# Patient Record
Sex: Male | Born: 1935 | Race: Black or African American | Hispanic: No | Marital: Married | Smoking: Former smoker
Health system: Southern US, Community
[De-identification: ages and names within clinical notes are randomized; demographics above are authoritative.]

## PROBLEM LIST (undated history)

## (undated) DIAGNOSIS — D46Z Other myelodysplastic syndromes: Secondary | ICD-10-CM

## (undated) DIAGNOSIS — D696 Thrombocytopenia, unspecified: Secondary | ICD-10-CM

## (undated) DIAGNOSIS — K922 Gastrointestinal hemorrhage, unspecified: Secondary | ICD-10-CM

## (undated) DIAGNOSIS — D638 Anemia in other chronic diseases classified elsewhere: Secondary | ICD-10-CM

## (undated) DIAGNOSIS — I10 Essential (primary) hypertension: Secondary | ICD-10-CM

## (undated) DIAGNOSIS — C801 Malignant (primary) neoplasm, unspecified: Secondary | ICD-10-CM

## (undated) DIAGNOSIS — I350 Nonrheumatic aortic (valve) stenosis: Secondary | ICD-10-CM

## (undated) DIAGNOSIS — M549 Dorsalgia, unspecified: Secondary | ICD-10-CM

## (undated) DIAGNOSIS — I2510 Atherosclerotic heart disease of native coronary artery without angina pectoris: Secondary | ICD-10-CM

## (undated) DIAGNOSIS — N289 Disorder of kidney and ureter, unspecified: Secondary | ICD-10-CM

## (undated) DIAGNOSIS — M1990 Unspecified osteoarthritis, unspecified site: Secondary | ICD-10-CM

## (undated) DIAGNOSIS — D4620 Refractory anemia with excess of blasts, unspecified: Secondary | ICD-10-CM

## (undated) DIAGNOSIS — E785 Hyperlipidemia, unspecified: Secondary | ICD-10-CM

## (undated) HISTORY — DX: Other myelodysplastic syndromes: D46.Z

## (undated) HISTORY — DX: Unspecified osteoarthritis, unspecified site: M19.90

## (undated) HISTORY — DX: Essential (primary) hypertension: I10

## (undated) HISTORY — DX: Nonrheumatic aortic (valve) stenosis: I35.0

## (undated) HISTORY — DX: Atherosclerotic heart disease of native coronary artery without angina pectoris: I25.10

## (undated) HISTORY — DX: Thrombocytopenia, unspecified: D69.6

## (undated) HISTORY — DX: Anemia in other chronic diseases classified elsewhere: D63.8

## (undated) HISTORY — DX: Gastrointestinal hemorrhage, unspecified: K92.2

## (undated) HISTORY — DX: Dorsalgia, unspecified: M54.9

## (undated) HISTORY — DX: Refractory anemia with excess of blasts, unspecified: D46.20

## (undated) HISTORY — DX: Hyperlipidemia, unspecified: E78.5

## (undated) HISTORY — DX: Disorder of kidney and ureter, unspecified: N28.9

---

## 2000-09-15 ENCOUNTER — Ambulatory Visit (HOSPITAL_COMMUNITY): Admission: RE | Admit: 2000-09-15 | Discharge: 2000-09-15 | Payer: Self-pay | Admitting: Internal Medicine

## 2003-02-12 ENCOUNTER — Encounter: Payer: Self-pay | Admitting: Emergency Medicine

## 2003-02-12 ENCOUNTER — Emergency Department (HOSPITAL_COMMUNITY): Admission: EM | Admit: 2003-02-12 | Discharge: 2003-02-13 | Payer: Self-pay | Admitting: Emergency Medicine

## 2005-09-18 ENCOUNTER — Ambulatory Visit: Payer: Self-pay | Admitting: Hematology & Oncology

## 2005-10-02 LAB — MORPHOLOGY

## 2005-10-02 LAB — COMPREHENSIVE METABOLIC PANEL
AST: 26 U/L (ref 0–37)
Albumin: 4.6 g/dL (ref 3.5–5.2)
Alkaline Phosphatase: 51 U/L (ref 39–117)
BUN: 28 mg/dL — ABNORMAL HIGH (ref 6–23)
Glucose, Bld: 155 mg/dL — ABNORMAL HIGH (ref 70–99)
Potassium: 4.2 mEq/L (ref 3.5–5.3)
Total Bilirubin: 0.6 mg/dL (ref 0.3–1.2)

## 2005-10-02 LAB — CBC WITH DIFFERENTIAL/PLATELET
BASO%: 1.5 % (ref 0.0–2.0)
EOS%: 19.8 % — ABNORMAL HIGH (ref 0.0–7.0)
MCH: 28.3 pg (ref 28.0–33.4)
MCHC: 33.2 g/dL (ref 32.0–35.9)
MONO#: 0.2 10*3/uL (ref 0.1–0.9)
RBC: 4.48 10*6/uL (ref 4.20–5.71)
RDW: 17.8 % — ABNORMAL HIGH (ref 11.2–14.6)
WBC: 6.1 10*3/uL (ref 4.0–10.0)
lymph#: 1.2 10*3/uL (ref 0.9–3.3)

## 2005-10-02 LAB — CHCC SMEAR

## 2005-10-03 LAB — PROTEIN ELECTROPHORESIS, SERUM
Albumin ELP: 56.6 % (ref 55.8–66.1)
Alpha-1-Globulin: 4.5 % (ref 2.9–4.9)
Alpha-2-Globulin: 11.8 % (ref 7.1–11.8)
Beta 2: 5.2 % (ref 3.2–6.5)
Beta Globulin: 5.9 % (ref 4.7–7.2)
Total Protein, Serum Electrophoresis: 7.5 g/dL (ref 6.0–8.3)

## 2005-10-31 ENCOUNTER — Ambulatory Visit: Payer: Self-pay | Admitting: Internal Medicine

## 2005-11-13 ENCOUNTER — Encounter (INDEPENDENT_AMBULATORY_CARE_PROVIDER_SITE_OTHER): Payer: Self-pay | Admitting: Specialist

## 2005-11-13 ENCOUNTER — Ambulatory Visit: Payer: Self-pay | Admitting: Internal Medicine

## 2005-11-21 ENCOUNTER — Ambulatory Visit: Payer: Self-pay | Admitting: Hematology & Oncology

## 2005-11-27 LAB — CBC WITH DIFFERENTIAL/PLATELET
BASO%: 0.3 % (ref 0.0–2.0)
Basophils Absolute: 0 10e3/uL (ref 0.0–0.1)
EOS%: 16 % — ABNORMAL HIGH (ref 0.0–7.0)
Eosinophils Absolute: 1 10e3/uL — ABNORMAL HIGH (ref 0.0–0.5)
HCT: 36.8 % — ABNORMAL LOW (ref 38.7–49.9)
HGB: 12.1 g/dL — ABNORMAL LOW (ref 13.0–17.1)
LYMPH%: 17.1 % (ref 14.0–48.0)
MCH: 28.5 pg (ref 28.0–33.4)
MCHC: 33 g/dL (ref 32.0–35.9)
MCV: 86.2 fL (ref 81.6–98.0)
MONO#: 0.3 10e3/uL (ref 0.1–0.9)
MONO%: 4.4 % (ref 0.0–13.0)
NEUT#: 3.9 10e3/uL (ref 1.5–6.5)
NEUT%: 62.2 % (ref 40.0–75.0)
Platelets: 154 10e3/uL (ref 145–400)
RBC: 4.27 10e6/uL (ref 4.20–5.71)
RDW: 18.3 % — ABNORMAL HIGH (ref 11.2–14.6)
WBC: 6.3 10e3/uL (ref 4.0–10.0)
lymph#: 1.1 10e3/uL (ref 0.9–3.3)

## 2005-11-27 LAB — MORPHOLOGY: PLT EST: ADEQUATE

## 2006-03-24 ENCOUNTER — Ambulatory Visit: Payer: Self-pay | Admitting: Hematology & Oncology

## 2006-03-26 LAB — CBC WITH DIFFERENTIAL/PLATELET
Basophils Absolute: 0 10*3/uL (ref 0.0–0.1)
Eosinophils Absolute: 0 10*3/uL (ref 0.0–0.5)
HGB: 11.9 g/dL — ABNORMAL LOW (ref 13.0–17.1)
MCV: 85.9 fL (ref 81.6–98.0)
MONO%: 4.3 % (ref 0.0–13.0)
NEUT#: 5 10*3/uL (ref 1.5–6.5)
RBC: 4.12 10*6/uL — ABNORMAL LOW (ref 4.20–5.71)
RDW: 18.7 % — ABNORMAL HIGH (ref 11.2–14.6)
WBC: 6.4 10*3/uL (ref 4.0–10.0)
lymph#: 1.1 10*3/uL (ref 0.9–3.3)

## 2006-03-26 LAB — CHCC SMEAR

## 2007-06-28 ENCOUNTER — Inpatient Hospital Stay (HOSPITAL_COMMUNITY): Admission: AD | Admit: 2007-06-28 | Discharge: 2007-07-12 | Payer: Self-pay | Admitting: Cardiology

## 2007-06-28 ENCOUNTER — Encounter: Payer: Self-pay | Admitting: Emergency Medicine

## 2007-06-29 ENCOUNTER — Encounter: Payer: Self-pay | Admitting: Cardiothoracic Surgery

## 2007-06-29 HISTORY — PX: CARDIAC CATHETERIZATION: SHX172

## 2007-06-30 ENCOUNTER — Encounter: Payer: Self-pay | Admitting: Cardiothoracic Surgery

## 2007-06-30 ENCOUNTER — Encounter: Payer: Self-pay | Admitting: Cardiology

## 2007-07-01 ENCOUNTER — Ambulatory Visit: Payer: Self-pay | Admitting: Cardiothoracic Surgery

## 2007-07-06 ENCOUNTER — Encounter: Payer: Self-pay | Admitting: Surgery

## 2007-07-06 HISTORY — PX: CORONARY ARTERY BYPASS GRAFT: SHX141

## 2007-07-28 ENCOUNTER — Ambulatory Visit: Payer: Self-pay | Admitting: Surgery

## 2007-07-28 ENCOUNTER — Encounter: Admission: RE | Admit: 2007-07-28 | Discharge: 2007-07-28 | Payer: Self-pay | Admitting: Surgery

## 2007-08-06 ENCOUNTER — Encounter (HOSPITAL_COMMUNITY): Admission: RE | Admit: 2007-08-06 | Discharge: 2007-11-04 | Payer: Self-pay | Admitting: Cardiology

## 2007-08-11 ENCOUNTER — Ambulatory Visit: Payer: Self-pay | Admitting: Hematology & Oncology

## 2007-08-13 LAB — CBC & DIFF AND RETIC
Basophils Absolute: 0.1 10*3/uL (ref 0.0–0.1)
EOS%: 18.6 % — ABNORMAL HIGH (ref 0.0–7.0)
Eosinophils Absolute: 0.9 10*3/uL — ABNORMAL HIGH (ref 0.0–0.5)
HCT: 27.2 % — ABNORMAL LOW (ref 38.7–49.9)
HGB: 9.3 g/dL — ABNORMAL LOW (ref 13.0–17.1)
LYMPH%: 16.6 % (ref 14.0–48.0)
MCH: 28.1 pg (ref 28.0–33.4)
MCV: 81.9 fL (ref 81.6–98.0)
MONO%: 3.9 % (ref 0.0–13.0)
NEUT#: 2.8 10*3/uL (ref 1.5–6.5)
NEUT%: 59.7 % (ref 40.0–75.0)
Platelets: 103 10*3/uL — ABNORMAL LOW (ref 145–400)
RDW: 15.6 % — ABNORMAL HIGH (ref 11.2–14.6)
RETIC #: 62.4 10*3/uL (ref 31.8–103.9)

## 2007-08-17 LAB — COMPREHENSIVE METABOLIC PANEL
AST: 11 U/L (ref 0–37)
Albumin: 4.2 g/dL (ref 3.5–5.2)
BUN: 14 mg/dL (ref 6–23)
CO2: 22 mEq/L (ref 19–32)
Calcium: 9.8 mg/dL (ref 8.4–10.5)
Chloride: 106 mEq/L (ref 96–112)
Creatinine, Ser: 0.98 mg/dL (ref 0.40–1.50)
Potassium: 4.3 mEq/L (ref 3.5–5.3)

## 2007-08-17 LAB — FERRITIN: Ferritin: 194 ng/mL (ref 22–322)

## 2007-08-17 LAB — PROTEIN ELECTROPHORESIS, SERUM
Albumin ELP: 54.4 % — ABNORMAL LOW (ref 55.8–66.1)
Beta 2: 4.5 % (ref 3.2–6.5)
Beta Globulin: 4.9 % (ref 4.7–7.2)
Total Protein, Serum Electrophoresis: 7 g/dL (ref 6.0–8.3)

## 2007-09-03 LAB — CBC WITH DIFFERENTIAL/PLATELET
BASO%: 0.9 % (ref 0.0–2.0)
Basophils Absolute: 0 10*3/uL (ref 0.0–0.1)
Eosinophils Absolute: 1.1 10*3/uL — ABNORMAL HIGH (ref 0.0–0.5)
HCT: 31.4 % — ABNORMAL LOW (ref 38.7–49.9)
HGB: 10.5 g/dL — ABNORMAL LOW (ref 13.0–17.1)
MCHC: 33.3 g/dL (ref 32.0–35.9)
MONO#: 0.2 10*3/uL (ref 0.1–0.9)
NEUT#: 2.9 10*3/uL (ref 1.5–6.5)
NEUT%: 55.9 % (ref 40.0–75.0)
WBC: 5.1 10*3/uL (ref 4.0–10.0)
lymph#: 0.9 10*3/uL (ref 0.9–3.3)

## 2007-09-22 ENCOUNTER — Ambulatory Visit: Payer: Self-pay | Admitting: Hematology & Oncology

## 2007-10-22 LAB — CBC WITH DIFFERENTIAL/PLATELET
BASO%: 0.4 % (ref 0.0–2.0)
MCHC: 33.9 g/dL (ref 32.0–35.9)
MONO#: 0.2 10*3/uL (ref 0.1–0.9)
NEUT#: 2.8 10*3/uL (ref 1.5–6.5)
RBC: 4 10*6/uL — ABNORMAL LOW (ref 4.20–5.71)
WBC: 5 10*3/uL (ref 4.0–10.0)
lymph#: 0.8 10*3/uL — ABNORMAL LOW (ref 0.9–3.3)

## 2007-11-05 ENCOUNTER — Encounter (HOSPITAL_COMMUNITY): Admission: RE | Admit: 2007-11-05 | Discharge: 2007-11-16 | Payer: Self-pay | Admitting: Cardiology

## 2007-11-17 ENCOUNTER — Ambulatory Visit: Payer: Self-pay | Admitting: Hematology & Oncology

## 2007-12-17 LAB — CBC & DIFF AND RETIC
BASO%: 0.5 % (ref 0.0–2.0)
Eosinophils Absolute: 1 10*3/uL — ABNORMAL HIGH (ref 0.0–0.5)
HCT: 31 % — ABNORMAL LOW (ref 38.7–49.9)
IRF: 0.33 (ref 0.070–0.380)
MCHC: 33.7 g/dL (ref 32.0–35.9)
MONO#: 0.2 10*3/uL (ref 0.1–0.9)
NEUT#: 2.8 10*3/uL (ref 1.5–6.5)
NEUT%: 56.4 % (ref 40.0–75.0)
RBC: 3.86 10*6/uL — ABNORMAL LOW (ref 4.20–5.71)
Retic %: 2.8 % — ABNORMAL HIGH (ref 0.7–2.3)
WBC: 5.1 10*3/uL (ref 4.0–10.0)
lymph#: 1 10*3/uL (ref 0.9–3.3)

## 2007-12-17 LAB — CHCC SMEAR

## 2008-01-11 ENCOUNTER — Ambulatory Visit: Payer: Self-pay | Admitting: Hematology

## 2008-01-14 LAB — CBC WITH DIFFERENTIAL/PLATELET
Basophils Absolute: 0 10*3/uL (ref 0.0–0.1)
Eosinophils Absolute: 1.4 10*3/uL — ABNORMAL HIGH (ref 0.0–0.5)
HGB: 11.1 g/dL — ABNORMAL LOW (ref 13.0–17.1)
MCV: 82.9 fL (ref 81.6–98.0)
MONO%: 3 % (ref 0.0–13.0)
NEUT#: 2.9 10*3/uL (ref 1.5–6.5)
RDW: 21.6 % — ABNORMAL HIGH (ref 11.2–14.6)
lymph#: 1 10*3/uL (ref 0.9–3.3)

## 2008-03-04 ENCOUNTER — Ambulatory Visit: Payer: Self-pay | Admitting: Hematology

## 2008-03-10 LAB — CBC WITH DIFFERENTIAL/PLATELET
Basophils Absolute: 0 10*3/uL (ref 0.0–0.1)
EOS%: 34.7 % — ABNORMAL HIGH (ref 0.0–7.0)
HGB: 11.6 g/dL — ABNORMAL LOW (ref 13.0–17.1)
MCH: 28.5 pg (ref 28.0–33.4)
NEUT#: 2.6 10*3/uL (ref 1.5–6.5)
RDW: 20.1 % — ABNORMAL HIGH (ref 11.2–14.6)
WBC: 5.6 10*3/uL (ref 4.0–10.0)
lymph#: 0.8 10*3/uL — ABNORMAL LOW (ref 0.9–3.3)

## 2008-04-11 LAB — CBC WITH DIFFERENTIAL/PLATELET
BASO%: 0.6 % (ref 0.0–2.0)
EOS%: 36.2 % — ABNORMAL HIGH (ref 0.0–7.0)
MCH: 28.6 pg (ref 28.0–33.4)
MCHC: 33.7 g/dL (ref 32.0–35.9)
NEUT%: 44.3 % (ref 40.0–75.0)
RBC: 4.2 10*6/uL (ref 4.20–5.71)
RDW: 19.5 % — ABNORMAL HIGH (ref 11.2–14.6)
lymph#: 0.9 10*3/uL (ref 0.9–3.3)

## 2008-05-05 ENCOUNTER — Ambulatory Visit: Payer: Self-pay | Admitting: Hematology

## 2008-05-09 LAB — CBC WITH DIFFERENTIAL/PLATELET
BASO%: 0.3 % (ref 0.0–2.0)
EOS%: 26.5 % — ABNORMAL HIGH (ref 0.0–7.0)
HCT: 34.4 % — ABNORMAL LOW (ref 38.7–49.9)
LYMPH%: 17.6 % (ref 14.0–48.0)
MCH: 28.8 pg (ref 28.0–33.4)
MCHC: 33.7 g/dL (ref 32.0–35.9)
MONO%: 3.7 % (ref 0.0–13.0)
NEUT%: 51.9 % (ref 40.0–75.0)
Platelets: 102 10*3/uL — ABNORMAL LOW (ref 145–400)
RBC: 4.03 10*6/uL — ABNORMAL LOW (ref 4.20–5.71)

## 2008-06-09 LAB — CHCC SMEAR

## 2008-06-09 LAB — CBC WITH DIFFERENTIAL/PLATELET
Basophils Absolute: 0 10*3/uL (ref 0.0–0.1)
EOS%: 27.5 % — ABNORMAL HIGH (ref 0.0–7.0)
HCT: 34.9 % — ABNORMAL LOW (ref 38.7–49.9)
HGB: 11.6 g/dL — ABNORMAL LOW (ref 13.0–17.1)
LYMPH%: 15.6 % (ref 14.0–48.0)
MCH: 28.7 pg (ref 28.0–33.4)
MCHC: 33.2 g/dL (ref 32.0–35.9)
MONO#: 0.2 10*3/uL (ref 0.1–0.9)
NEUT%: 53.6 % (ref 40.0–75.0)
Platelets: 103 10*3/uL — ABNORMAL LOW (ref 145–400)
lymph#: 0.9 10*3/uL (ref 0.9–3.3)

## 2008-06-09 LAB — MORPHOLOGY: PLT EST: DECREASED

## 2008-06-09 LAB — COMPREHENSIVE METABOLIC PANEL
ALT: 12 U/L (ref 0–53)
AST: 16 U/L (ref 0–37)
Creatinine, Ser: 1.01 mg/dL (ref 0.40–1.50)
Sodium: 142 mEq/L (ref 135–145)
Total Bilirubin: 0.6 mg/dL (ref 0.3–1.2)
Total Protein: 7.4 g/dL (ref 6.0–8.3)

## 2008-06-09 LAB — FERRITIN: Ferritin: 100 ng/mL (ref 22–322)

## 2008-06-09 LAB — IRON AND TIBC: %SAT: 21 % (ref 20–55)

## 2008-06-29 ENCOUNTER — Ambulatory Visit: Payer: Self-pay | Admitting: Hematology

## 2008-07-04 LAB — CBC WITH DIFFERENTIAL/PLATELET
Basophils Absolute: 0 10*3/uL (ref 0.0–0.1)
EOS%: 32.4 % — ABNORMAL HIGH (ref 0.0–7.0)
HGB: 11.9 g/dL — ABNORMAL LOW (ref 13.0–17.1)
MCH: 28.9 pg (ref 28.0–33.4)
NEUT#: 2.7 10*3/uL (ref 1.5–6.5)
RBC: 4.13 10*6/uL — ABNORMAL LOW (ref 4.20–5.71)
RDW: 19.6 % — ABNORMAL HIGH (ref 11.2–14.6)
lymph#: 0.8 10*3/uL — ABNORMAL LOW (ref 0.9–3.3)

## 2008-07-04 LAB — MORPHOLOGY

## 2008-07-04 LAB — COMPREHENSIVE METABOLIC PANEL
ALT: 12 U/L (ref 0–53)
AST: 17 U/L (ref 0–37)
BUN: 17 mg/dL (ref 6–23)
Calcium: 9.8 mg/dL (ref 8.4–10.5)
Chloride: 104 mEq/L (ref 96–112)
Creatinine, Ser: 1.06 mg/dL (ref 0.40–1.50)
Total Bilirubin: 0.6 mg/dL (ref 0.3–1.2)

## 2008-08-29 ENCOUNTER — Ambulatory Visit: Payer: Self-pay | Admitting: Oncology

## 2008-08-31 LAB — CBC WITH DIFFERENTIAL/PLATELET
BASO%: 0.3 % (ref 0.0–2.0)
HCT: 35.4 % — ABNORMAL LOW (ref 38.4–49.9)
LYMPH%: 15.8 % (ref 14.0–49.0)
MCHC: 33.3 g/dL (ref 32.0–36.0)
MONO#: 0.2 10*3/uL (ref 0.1–0.9)
NEUT%: 29.7 % — ABNORMAL LOW (ref 39.0–75.0)
Platelets: 101 10*3/uL — ABNORMAL LOW (ref 140–400)
WBC: 6.4 10*3/uL (ref 4.0–10.3)

## 2008-09-08 ENCOUNTER — Other Ambulatory Visit: Admission: RE | Admit: 2008-09-08 | Discharge: 2008-09-08 | Payer: Self-pay | Admitting: Oncology

## 2008-09-08 ENCOUNTER — Encounter: Payer: Self-pay | Admitting: Oncology

## 2008-09-19 LAB — OTHER SOLSTAS TEST

## 2008-10-25 ENCOUNTER — Encounter (INDEPENDENT_AMBULATORY_CARE_PROVIDER_SITE_OTHER): Payer: Self-pay | Admitting: *Deleted

## 2008-11-09 ENCOUNTER — Ambulatory Visit: Payer: Self-pay | Admitting: Oncology

## 2008-11-11 LAB — CBC WITH DIFFERENTIAL/PLATELET
Basophils Absolute: 0 10*3/uL (ref 0.0–0.1)
Eosinophils Absolute: 2.1 10*3/uL — ABNORMAL HIGH (ref 0.0–0.5)
HCT: 32.4 % — ABNORMAL LOW (ref 38.4–49.9)
HGB: 10.7 g/dL — ABNORMAL LOW (ref 13.0–17.1)
LYMPH%: 14.3 % (ref 14.0–49.0)
MONO#: 0.3 10*3/uL (ref 0.1–0.9)
NEUT#: 3.3 10*3/uL (ref 1.5–6.5)
NEUT%: 49.7 % (ref 39.0–75.0)
Platelets: 98 10*3/uL — ABNORMAL LOW (ref 140–400)
RBC: 3.8 10*6/uL — ABNORMAL LOW (ref 4.20–5.82)
WBC: 6.6 10*3/uL (ref 4.0–10.3)

## 2008-11-25 ENCOUNTER — Inpatient Hospital Stay (HOSPITAL_COMMUNITY): Admission: RE | Admit: 2008-11-25 | Discharge: 2008-12-02 | Payer: Self-pay | Admitting: Orthopedic Surgery

## 2008-11-25 HISTORY — PX: TOTAL KNEE ARTHROPLASTY: SHX125

## 2008-11-30 ENCOUNTER — Ambulatory Visit: Payer: Self-pay | Admitting: Oncology

## 2008-12-12 LAB — CBC WITH DIFFERENTIAL/PLATELET
Basophils Absolute: 0 10*3/uL (ref 0.0–0.1)
EOS%: 13 % — ABNORMAL HIGH (ref 0.0–7.0)
Eosinophils Absolute: 0.6 10*3/uL — ABNORMAL HIGH (ref 0.0–0.5)
HGB: 7.9 g/dL — ABNORMAL LOW (ref 13.0–17.1)
LYMPH%: 13.4 % — ABNORMAL LOW (ref 14.0–49.0)
MCH: 27.4 pg (ref 27.2–33.4)
MCV: 82.6 fL (ref 79.3–98.0)
MONO%: 4.4 % (ref 0.0–14.0)
NEUT#: 3 10*3/uL (ref 1.5–6.5)
Platelets: 100 10*3/uL — ABNORMAL LOW (ref 140–400)
RBC: 2.88 10*6/uL — ABNORMAL LOW (ref 4.20–5.82)

## 2008-12-12 LAB — COMPREHENSIVE METABOLIC PANEL
BUN: 14 mg/dL (ref 6–23)
CO2: 27 mEq/L (ref 19–32)
Creatinine, Ser: 1.11 mg/dL (ref 0.40–1.50)
Glucose, Bld: 126 mg/dL — ABNORMAL HIGH (ref 70–99)
Total Bilirubin: 1.2 mg/dL (ref 0.3–1.2)

## 2009-01-09 ENCOUNTER — Ambulatory Visit: Payer: Self-pay | Admitting: Oncology

## 2009-01-12 LAB — CBC WITH DIFFERENTIAL/PLATELET
BASO%: 0 % (ref 0.0–2.0)
EOS%: 32.8 % — ABNORMAL HIGH (ref 0.0–7.0)
HCT: 25 % — ABNORMAL LOW (ref 38.4–49.9)
MCH: 26.8 pg — ABNORMAL LOW (ref 27.2–33.4)
MCHC: 32.4 g/dL (ref 32.0–36.0)
MONO%: 4.7 % (ref 0.0–14.0)
NEUT%: 47.1 % (ref 39.0–75.0)
RDW: 20.2 % — ABNORMAL HIGH (ref 11.0–14.6)
lymph#: 0.8 10*3/uL — ABNORMAL LOW (ref 0.9–3.3)

## 2009-01-12 LAB — COMPREHENSIVE METABOLIC PANEL
AST: 13 U/L (ref 0–37)
Albumin: 3.9 g/dL (ref 3.5–5.2)
Alkaline Phosphatase: 47 U/L (ref 39–117)
BUN: 17 mg/dL (ref 6–23)
Creatinine, Ser: 1.1 mg/dL (ref 0.40–1.50)
Glucose, Bld: 114 mg/dL — ABNORMAL HIGH (ref 70–99)
Potassium: 4 mEq/L (ref 3.5–5.3)
Total Bilirubin: 0.7 mg/dL (ref 0.3–1.2)

## 2009-01-12 LAB — CHCC SMEAR

## 2009-01-26 LAB — CBC WITH DIFFERENTIAL/PLATELET
Basophils Absolute: 0 10*3/uL (ref 0.0–0.1)
Eosinophils Absolute: 1.4 10*3/uL — ABNORMAL HIGH (ref 0.0–0.5)
HCT: 28.4 % — ABNORMAL LOW (ref 38.4–49.9)
HGB: 9.3 g/dL — ABNORMAL LOW (ref 13.0–17.1)
MCH: 28.2 pg (ref 27.2–33.4)
MCV: 85.8 fL (ref 79.3–98.0)
MONO%: 3.7 % (ref 0.0–14.0)
NEUT#: 2.6 10*3/uL (ref 1.5–6.5)
NEUT%: 51.7 % (ref 39.0–75.0)
RDW: 22.6 % — ABNORMAL HIGH (ref 11.0–14.6)

## 2009-02-07 ENCOUNTER — Ambulatory Visit: Payer: Self-pay | Admitting: Oncology

## 2009-02-09 LAB — CBC WITH DIFFERENTIAL/PLATELET
BASO%: 0.4 % (ref 0.0–2.0)
EOS%: 37.4 % — ABNORMAL HIGH (ref 0.0–7.0)
HCT: 31.6 % — ABNORMAL LOW (ref 38.4–49.9)
LYMPH%: 18.9 % (ref 14.0–49.0)
MCH: 26.8 pg — ABNORMAL LOW (ref 27.2–33.4)
MCHC: 32.6 g/dL (ref 32.0–36.0)
NEUT%: 39.2 % (ref 39.0–75.0)
Platelets: 72 10*3/uL — ABNORMAL LOW (ref 140–400)

## 2009-02-23 LAB — CBC WITH DIFFERENTIAL/PLATELET
BASO%: 0 % (ref 0.0–2.0)
LYMPH%: 19.1 % (ref 14.0–49.0)
MCHC: 32.9 g/dL (ref 32.0–36.0)
MCV: 84.9 fL (ref 79.3–98.0)
MONO%: 3 % (ref 0.0–14.0)
Platelets: 124 10*3/uL — ABNORMAL LOW (ref 140–400)
RBC: 3.87 10*6/uL — ABNORMAL LOW (ref 4.20–5.82)

## 2009-03-07 ENCOUNTER — Ambulatory Visit: Payer: Self-pay | Admitting: Oncology

## 2009-03-09 LAB — CBC WITH DIFFERENTIAL/PLATELET
Basophils Absolute: 0 10*3/uL (ref 0.0–0.1)
EOS%: 49 % — ABNORMAL HIGH (ref 0.0–7.0)
Eosinophils Absolute: 3.2 10*3/uL — ABNORMAL HIGH (ref 0.0–0.5)
HCT: 33.5 % — ABNORMAL LOW (ref 38.4–49.9)
HGB: 11 g/dL — ABNORMAL LOW (ref 13.0–17.1)
LYMPH%: 17.9 % (ref 14.0–49.0)
MCH: 26.8 pg — ABNORMAL LOW (ref 27.2–33.4)
MCV: 81.5 fL (ref 79.3–98.0)
MONO%: 3.4 % (ref 0.0–14.0)
NEUT#: 1.9 10*3/uL (ref 1.5–6.5)
NEUT%: 29.4 % — ABNORMAL LOW (ref 39.0–75.0)
Platelets: 88 10*3/uL — ABNORMAL LOW (ref 140–400)
RDW: 19.6 % — ABNORMAL HIGH (ref 11.0–14.6)

## 2009-03-23 LAB — CBC WITH DIFFERENTIAL/PLATELET
BASO%: 0.5 % (ref 0.0–2.0)
HCT: 36.7 % — ABNORMAL LOW (ref 38.4–49.9)
LYMPH%: 14.2 % (ref 14.0–49.0)
MCH: 27.7 pg (ref 27.2–33.4)
MCHC: 33 g/dL (ref 32.0–36.0)
MCV: 83.9 fL (ref 79.3–98.0)
MONO#: 0.2 10*3/uL (ref 0.1–0.9)
MONO%: 2.2 % (ref 0.0–14.0)
NEUT%: 51.1 % (ref 39.0–75.0)
Platelets: 140 10*3/uL (ref 140–400)
RBC: 4.38 10*6/uL (ref 4.20–5.82)
WBC: 6.8 10*3/uL (ref 4.0–10.3)

## 2009-03-23 LAB — COMPREHENSIVE METABOLIC PANEL
ALT: 10 U/L (ref 0–53)
Alkaline Phosphatase: 53 U/L (ref 39–117)
CO2: 26 mEq/L (ref 19–32)
Creatinine, Ser: 1.07 mg/dL (ref 0.40–1.50)
Sodium: 142 mEq/L (ref 135–145)
Total Bilirubin: 0.5 mg/dL (ref 0.3–1.2)

## 2009-03-23 LAB — IRON AND TIBC
%SAT: 26 % (ref 20–55)
TIBC: 251 ug/dL (ref 215–435)

## 2009-03-23 LAB — FOLATE: Folate: >20 ng/mL

## 2009-03-23 LAB — VITAMIN B12: Vitamin B-12: 799 pg/mL (ref 211–911)

## 2009-03-23 LAB — TSH: TSH: 1.919 u[IU]/mL (ref 0.350–4.500)

## 2009-03-23 LAB — LACTATE DEHYDROGENASE: LDH: 210 U/L (ref 94–250)

## 2009-04-04 ENCOUNTER — Ambulatory Visit: Payer: Self-pay | Admitting: Oncology

## 2009-04-06 LAB — CBC WITH DIFFERENTIAL/PLATELET
MCHC: 33.7 g/dL (ref 32.0–36.0)
Platelets: 136 10*3/uL — ABNORMAL LOW (ref 140–400)
RBC: 4.26 10*6/uL (ref 4.20–5.82)

## 2009-04-06 LAB — MANUAL DIFFERENTIAL
Band Neutrophils: 0 % (ref 0–10)
Basophil: 1 % (ref 0–2)
EOS: 52 % — ABNORMAL HIGH (ref 0–7)
LYMPH: 13 % — ABNORMAL LOW (ref 14–49)
Other Cell: 0 % (ref 0–0)
SEG: 33 % — ABNORMAL LOW (ref 38–77)
nRBC: 0 % (ref 0–0)

## 2009-04-20 LAB — CBC WITH DIFFERENTIAL/PLATELET
BASO%: 0.4 % (ref 0.0–2.0)
Basophils Absolute: 0 10*3/uL (ref 0.0–0.1)
HCT: 35.1 % — ABNORMAL LOW (ref 38.4–49.9)
HGB: 11.5 g/dL — ABNORMAL LOW (ref 13.0–17.1)
LYMPH%: 16.6 % (ref 14.0–49.0)
MCHC: 32.8 g/dL (ref 32.0–36.0)
MONO#: 0.2 10*3/uL (ref 0.1–0.9)
NEUT%: 21.9 % — ABNORMAL LOW (ref 39.0–75.0)
Platelets: 88 10*3/uL — ABNORMAL LOW (ref 140–400)
WBC: 7.2 10*3/uL (ref 4.0–10.3)
lymph#: 1.2 10*3/uL (ref 0.9–3.3)

## 2009-05-04 ENCOUNTER — Ambulatory Visit: Payer: Self-pay | Admitting: Oncology

## 2009-05-04 LAB — CBC WITH DIFFERENTIAL/PLATELET
BASO%: 0.3 % (ref 0.0–2.0)
EOS%: 50.7 % — ABNORMAL HIGH (ref 0.0–7.0)
HGB: 11.4 g/dL — ABNORMAL LOW (ref 13.0–17.1)
MCH: 26.8 pg — ABNORMAL LOW (ref 27.2–33.4)
MCHC: 32 g/dL (ref 32.0–36.0)
MONO#: 0.2 10*3/uL (ref 0.1–0.9)
RDW: 19 % — ABNORMAL HIGH (ref 11.0–14.6)
WBC: 7.3 10*3/uL (ref 4.0–10.3)
lymph#: 1.3 10*3/uL (ref 0.9–3.3)

## 2009-05-18 LAB — CBC WITH DIFFERENTIAL/PLATELET
Eosinophils Absolute: 3.7 10*3/uL — ABNORMAL HIGH (ref 0.0–0.5)
HGB: 11.7 g/dL — ABNORMAL LOW (ref 13.0–17.1)
MONO#: 0.2 10*3/uL (ref 0.1–0.9)
NEUT#: 2.1 10*3/uL (ref 1.5–6.5)
RBC: 4.34 10*6/uL (ref 4.20–5.82)
RDW: 18.4 % — ABNORMAL HIGH (ref 11.0–14.6)
WBC: 7.1 10*3/uL (ref 4.0–10.3)
lymph#: 1.1 10*3/uL (ref 0.9–3.3)
nRBC: 0 % (ref 0–0)

## 2009-05-18 LAB — COMPREHENSIVE METABOLIC PANEL
ALT: 14 U/L (ref 0–53)
Albumin: 4.7 g/dL (ref 3.5–5.2)
CO2: 26 mEq/L (ref 19–32)
Calcium: 9.9 mg/dL (ref 8.4–10.5)
Chloride: 103 mEq/L (ref 96–112)
Potassium: 4.2 mEq/L (ref 3.5–5.3)
Sodium: 144 mEq/L (ref 135–145)
Total Bilirubin: 0.5 mg/dL (ref 0.3–1.2)
Total Protein: 7.5 g/dL (ref 6.0–8.3)

## 2009-05-18 LAB — LACTATE DEHYDROGENASE: LDH: 212 U/L (ref 94–250)

## 2009-07-14 ENCOUNTER — Ambulatory Visit: Payer: Self-pay | Admitting: Oncology

## 2009-07-18 LAB — CBC WITH DIFFERENTIAL/PLATELET
BASO%: 0.4 % (ref 0.0–2.0)
Basophils Absolute: 0 10*3/uL (ref 0.0–0.1)
EOS%: 56.2 % — ABNORMAL HIGH (ref 0.0–7.0)
Eosinophils Absolute: 4.5 10*3/uL — ABNORMAL HIGH (ref 0.0–0.5)
HCT: 36.8 % — ABNORMAL LOW (ref 38.4–49.9)
HGB: 12.1 g/dL — ABNORMAL LOW (ref 13.0–17.1)
LYMPH%: 14.7 % (ref 14.0–49.0)
MCH: 27.3 pg (ref 27.2–33.4)
MCHC: 32.9 g/dL (ref 32.0–36.0)
MCV: 82.9 fL (ref 79.3–98.0)
MONO#: 0.3 10*3/uL (ref 0.1–0.9)
MONO%: 3.5 % (ref 0.0–14.0)
NEUT#: 2 10*3/uL (ref 1.5–6.5)
NEUT%: 25.2 % — ABNORMAL LOW (ref 39.0–75.0)
Platelets: 103 10*3/uL — ABNORMAL LOW (ref 140–400)
RBC: 4.44 10*6/uL (ref 4.20–5.82)
RDW: 18.3 % — ABNORMAL HIGH (ref 11.0–14.6)
WBC: 8 10*3/uL (ref 4.0–10.3)
lymph#: 1.2 10*3/uL (ref 0.9–3.3)
nRBC: 1 % — ABNORMAL HIGH (ref 0–0)

## 2009-08-15 ENCOUNTER — Ambulatory Visit: Payer: Self-pay | Admitting: Oncology

## 2009-08-18 LAB — CBC WITH DIFFERENTIAL/PLATELET
BASO%: 0.2 % (ref 0.0–2.0)
Eosinophils Absolute: 4.8 10*3/uL — ABNORMAL HIGH (ref 0.0–0.5)
LYMPH%: 13 % — ABNORMAL LOW (ref 14.0–49.0)
MCHC: 32.8 g/dL (ref 32.0–36.0)
MCV: 82.6 fL (ref 79.3–98.0)
MONO%: 3 % (ref 0.0–14.0)
NEUT#: 1.9 10*3/uL (ref 1.5–6.5)
Platelets: 87 10*3/uL — ABNORMAL LOW (ref 140–400)
RBC: 4.43 10*6/uL (ref 4.20–5.82)
RDW: 18.1 % — ABNORMAL HIGH (ref 11.0–14.6)
WBC: 8.1 10*3/uL (ref 4.0–10.3)
nRBC: 0 % (ref 0–0)

## 2009-08-24 ENCOUNTER — Telehealth: Payer: Self-pay | Admitting: Internal Medicine

## 2009-09-13 ENCOUNTER — Ambulatory Visit: Payer: Self-pay | Admitting: Oncology

## 2009-09-15 LAB — MANUAL DIFFERENTIAL
ALC: 1.4 10*3/uL (ref 0.9–3.3)
Metamyelocytes: 0 % (ref 0–0)
Myelocytes: 0 % (ref 0–0)
Other Cell: 0 % (ref 0–0)
PLT EST: DECREASED
PROMYELO: 0 % (ref 0–0)
SEG: 23 % — ABNORMAL LOW (ref 38–77)
Variant Lymph: 0 % (ref 0–0)

## 2009-09-15 LAB — CBC WITH DIFFERENTIAL/PLATELET
HGB: 11.6 g/dL — ABNORMAL LOW (ref 13.0–17.1)
RBC: 4.28 10*6/uL (ref 4.20–5.82)
RDW: 18.3 % — ABNORMAL HIGH (ref 11.0–14.6)
WBC: 8 10*3/uL (ref 4.0–10.3)
nRBC: 0 % (ref 0–0)

## 2009-10-16 ENCOUNTER — Ambulatory Visit: Payer: Self-pay | Admitting: Oncology

## 2009-10-16 LAB — CBC WITH DIFFERENTIAL/PLATELET
BASO%: 0.4 % (ref 0.0–2.0)
EOS%: 43.9 % — ABNORMAL HIGH (ref 0.0–7.0)
MCH: 28.4 pg (ref 27.2–33.4)
MCHC: 33.3 g/dL (ref 32.0–36.0)
MCV: 85.1 fL (ref 79.3–98.0)
MONO%: 2.5 % (ref 0.0–14.0)
NEUT#: 3.3 10*3/uL (ref 1.5–6.5)
RBC: 4.34 10*6/uL (ref 4.20–5.82)
RDW: 20.7 % — ABNORMAL HIGH (ref 11.0–14.6)

## 2009-11-03 IMAGING — CT CT ANGIO CHEST
3 of 4 series · 17 of 30 positions shown · IV contrast (omnipaque)
Comparison: Chest radiograph on 06/28/07.

CLINICAL DATA: A 73-year-old with chest pain and abnormal EKG.  Evaluate for embolism and aneurysm. 
CT ANGIOGRAPHY OF CHEST:
TECHNIQUE: Multidetector CT imaging of the chest was performed during bolus injection of intravenous contrast.  Multiplanar CT angiographic image reconstructions were generated to evaluate the vascular anatomy.
Contrast:  100 ml Omnipaque 300.

[Series 2: pe · axial · 0.74mm/px · z∈[-291,-95]mm · 7 of 221 slices shown]
[im 32/221  lung]
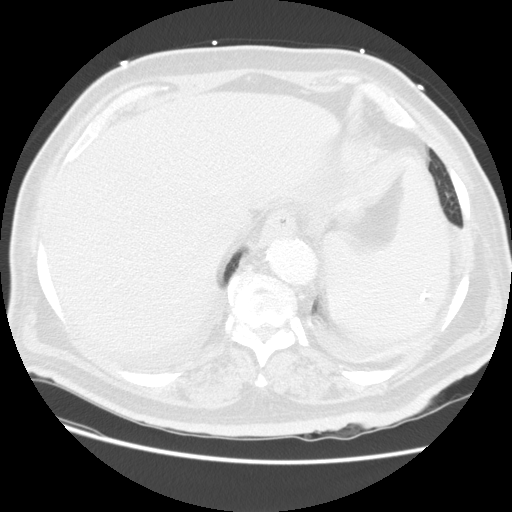
[im 63/221  mediastinal]
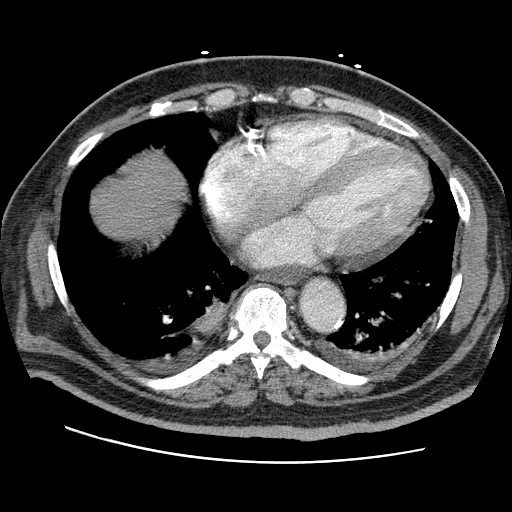
[im 95/221  lung]
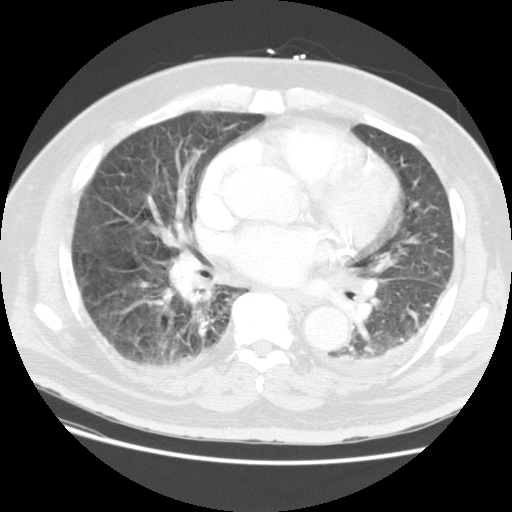
[im 125/221  mediastinal]
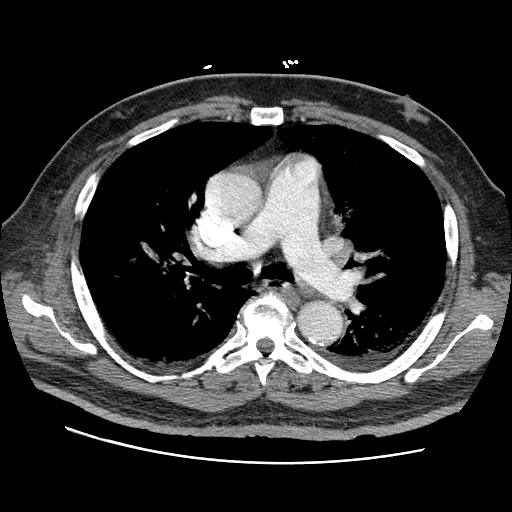
[im 126/221  lung]
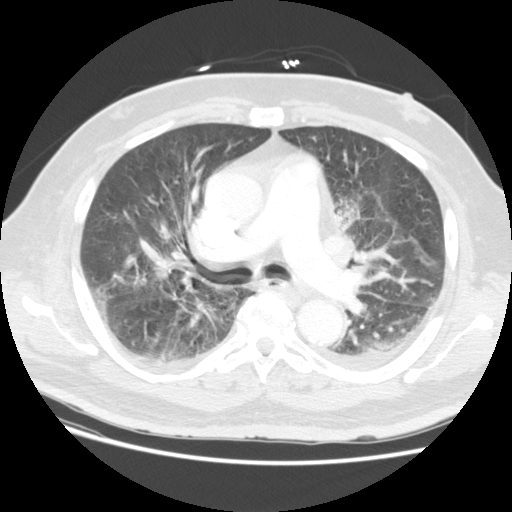
[im 158/221  mediastinal]
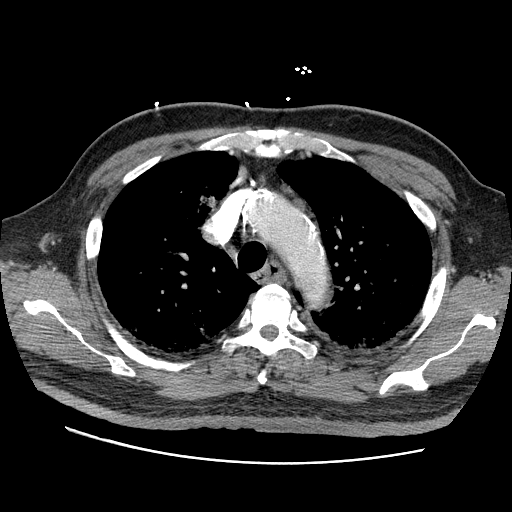
[im 189/221  lung]
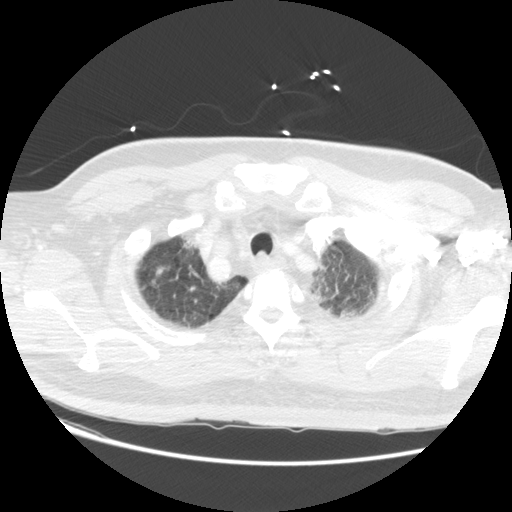

[Series 3: recon 2: pe · axial · 0.74mm/px · z∈[-262,-125]mm · 4 of 111 slices shown]
[im 28/111  lung]
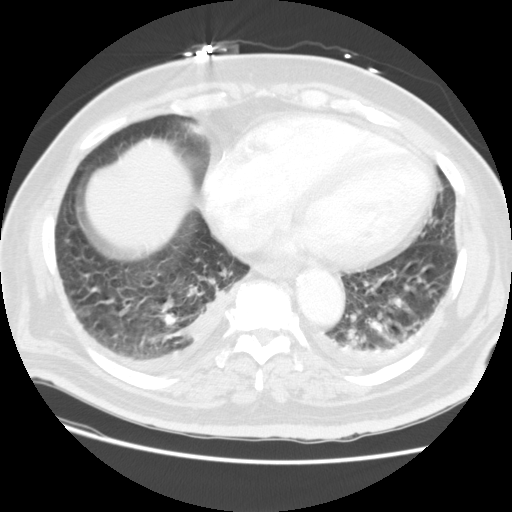
[im 56/111  lung]
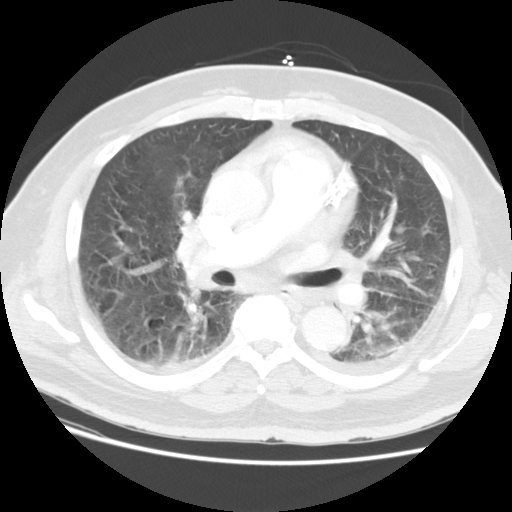
[im 63/111  lung]
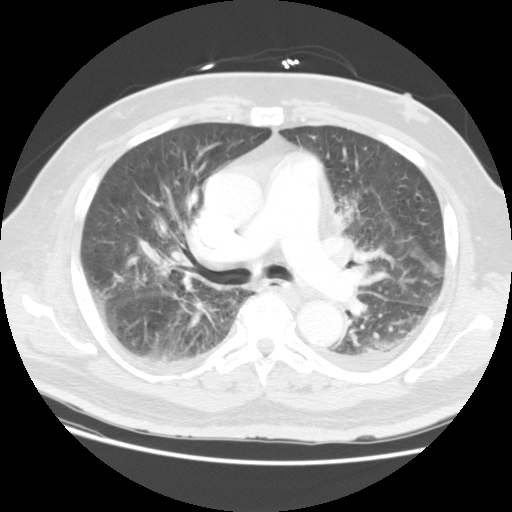
[im 83/111  lung]
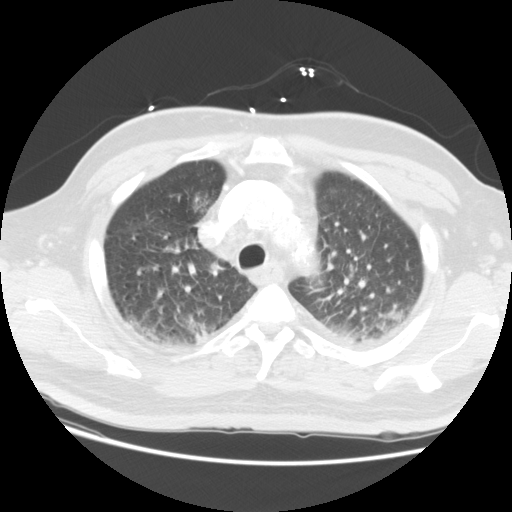

[Series 201: reformatted · sagittal · 0.74mm/px · 6 of 180 slices shown]
[im 26/180  lung]
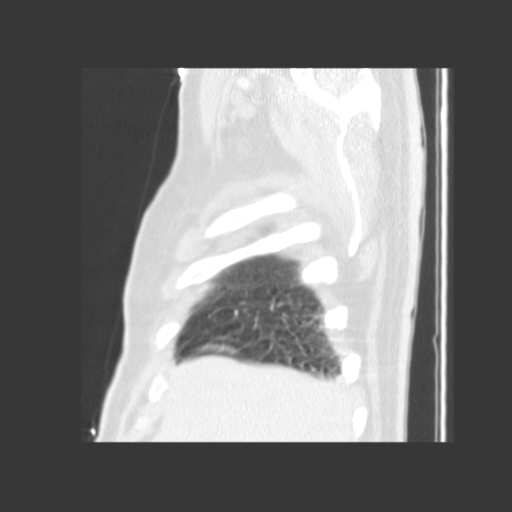
[im 52/180  lung]
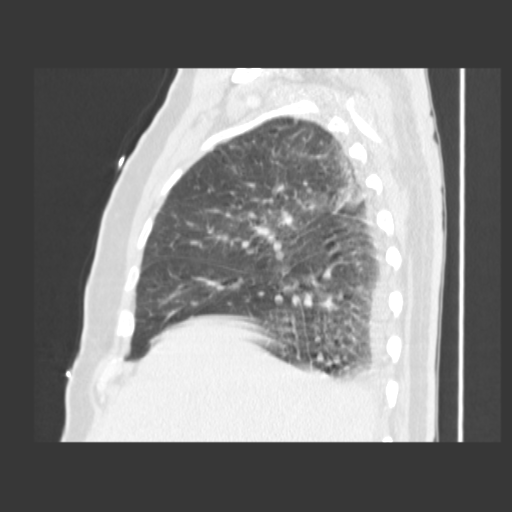
[im 77/180  lung]
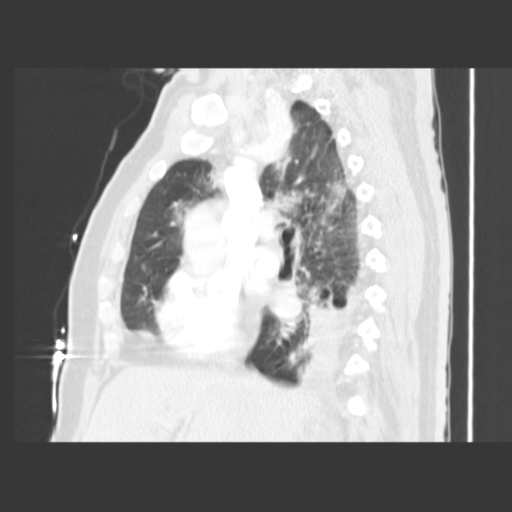
[im 103/180  lung]
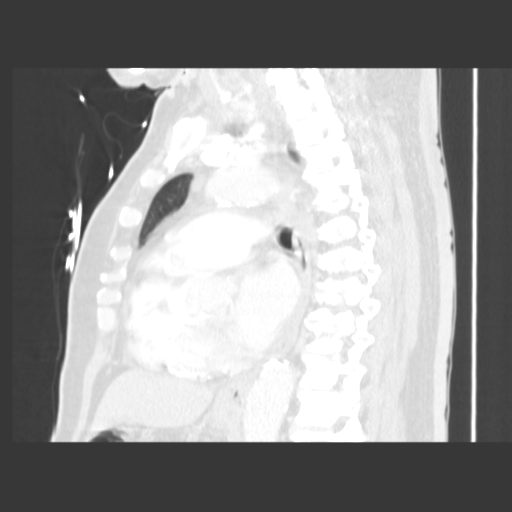
[im 128/180  lung]
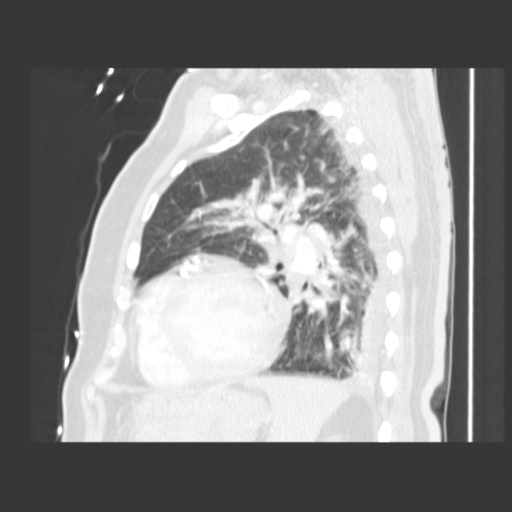
[im 154/180  lung]
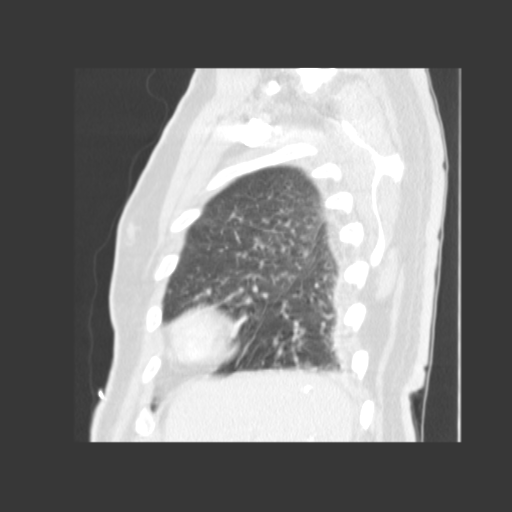

[17 of 30 positions shown; findings below may reference images not displayed]

FINDINGS: There are scattered slightly enlarged lymph nodes throughout the chest.  Prominent nodes in the pre-vascular space measure up to 1.3 cm in short axis on sequence 2, image #80.  Right superior mediastinal lymph node measures 1.2 cm in short axis on sequence 2, image #34.  Fullness involving the subcarinal tissue as well.  Evaluation for a pulmonary embolism is very limited due to respiratory motion.  There is no evidence for a large emboli involving the main pulmonary arteries or lobar branches.  However, the evaluation of the segmental branches and distal branches is severely limited.  Heavily calcified coronary arteries.  Evidence for a small hiatal hernia.
The trachea and mainstem bronchi appear to be patent.  Patchy interstitial densities throughout the lungs, but particularly in the upper lung regions.  A small amount of bilateral pleural fluid.  The thoracic aorta measures 3.6 cm at the arch, and the ascending thoracic aorta measures up to 4.7 cm in the AP dimension.  A few small nodular type densities in the upper lung region concerning for foci of infection.  No acute bone abnormalities.
IMPRESSION: 1.  Limited pulmonary embolism evaluation, but no evidence for a large central clot as described. 
2.  Patchy interstitial disease particularly in the upper lung regions concerning for foci of infection, although there may also be a component of edema. 
3.  Diffuse ectasia of the thoracic aorta as described.  No evidence for dissection. 
4.  Mild chest lymphadenopathy.  This is likely reactive in nature, but probably warrants follow up. 
5.  The areas suspicious for infection were not mentioned in the preliminary report.  This information was called to the patient's nurse in the unit, and these results were conveyed to her at [DATE] a.m. on 07/02/07.
This report is delayed due to PACS failure.

## 2009-11-08 IMAGING — CR DG CHEST 2V
2 series · 2 of 2 positions shown · non-contrast
Comparison: 07/03/07.

CLINICAL DATA: Preop for cardiac surgery tomorrow.
 CHEST - 2 VIEW:

[w chest pa]
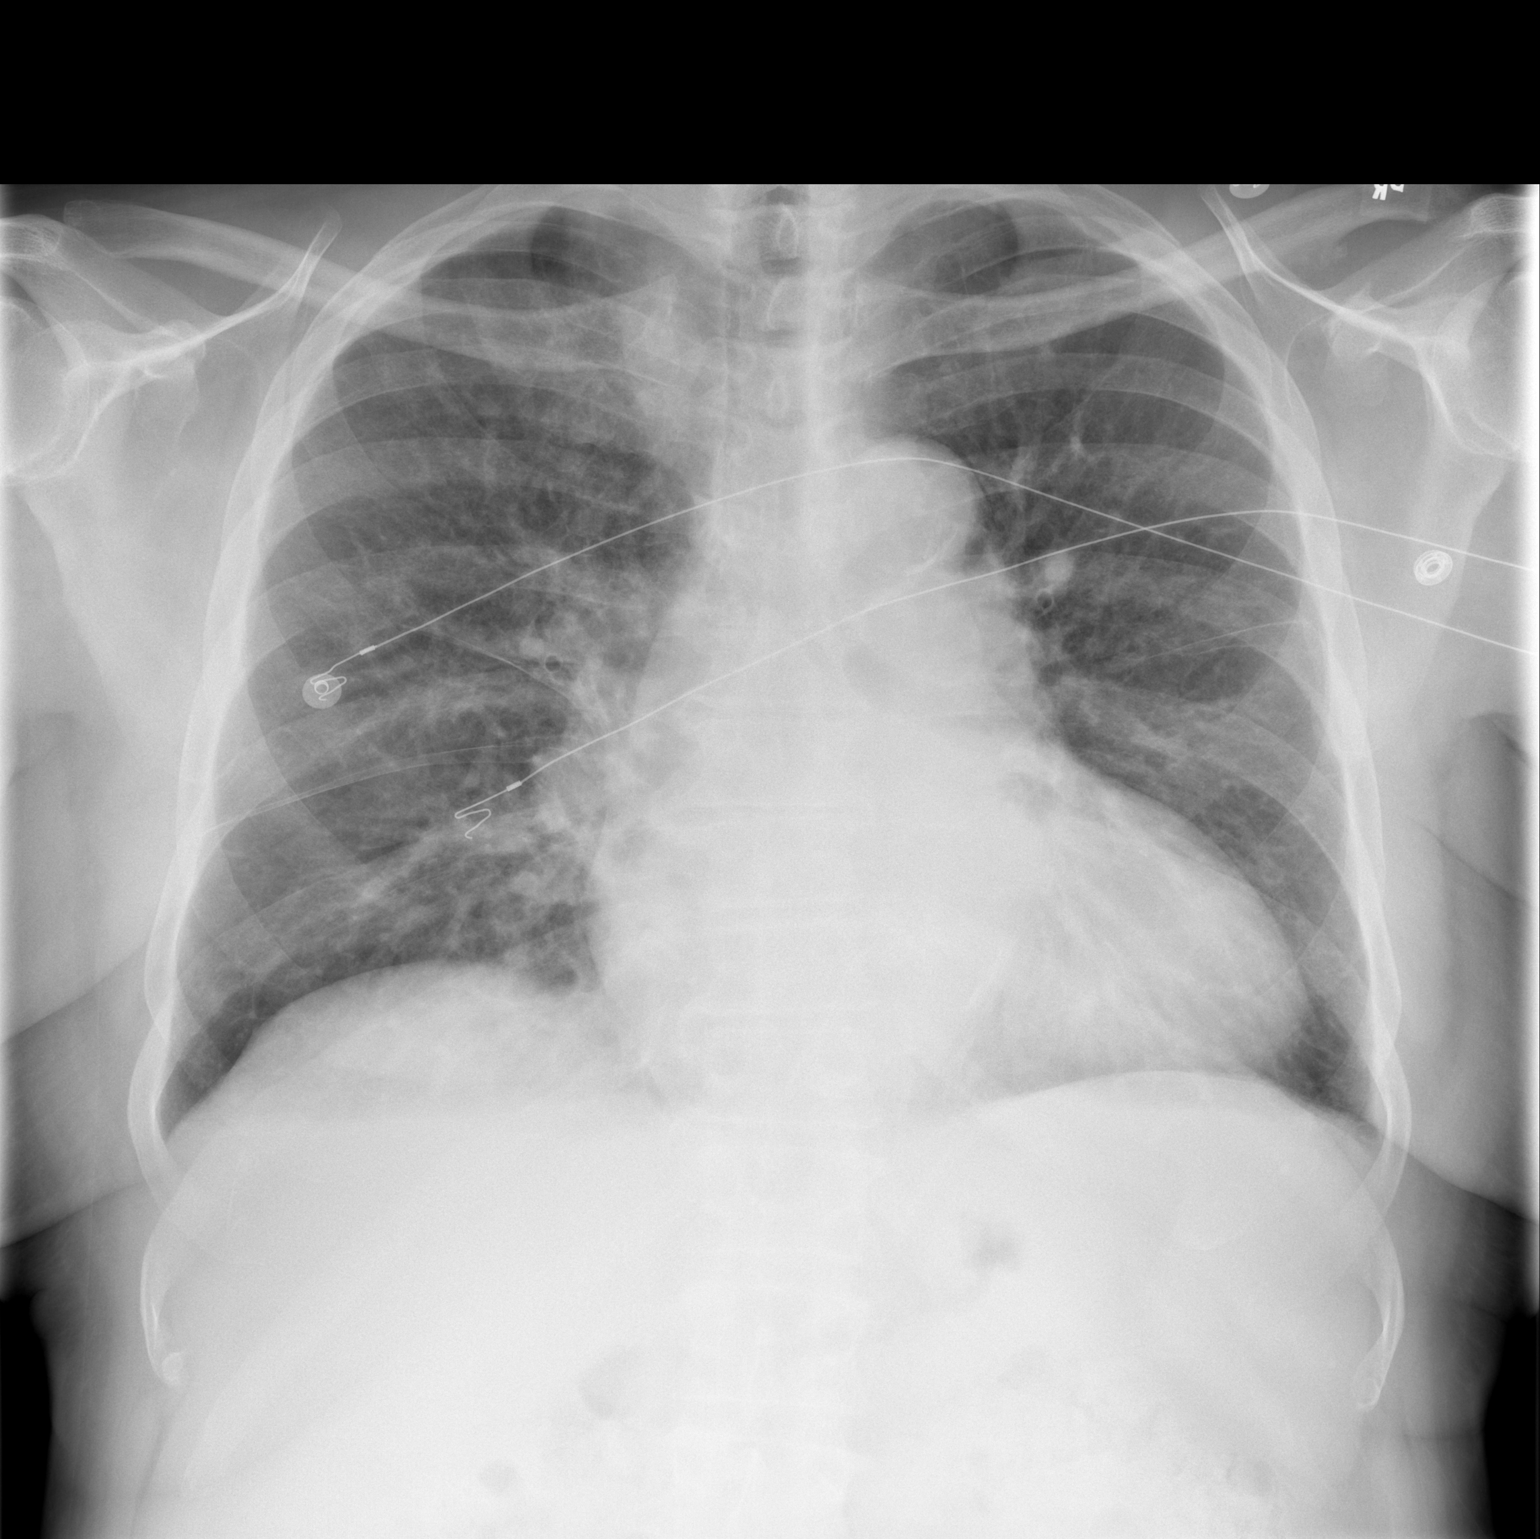

[w chest lat]
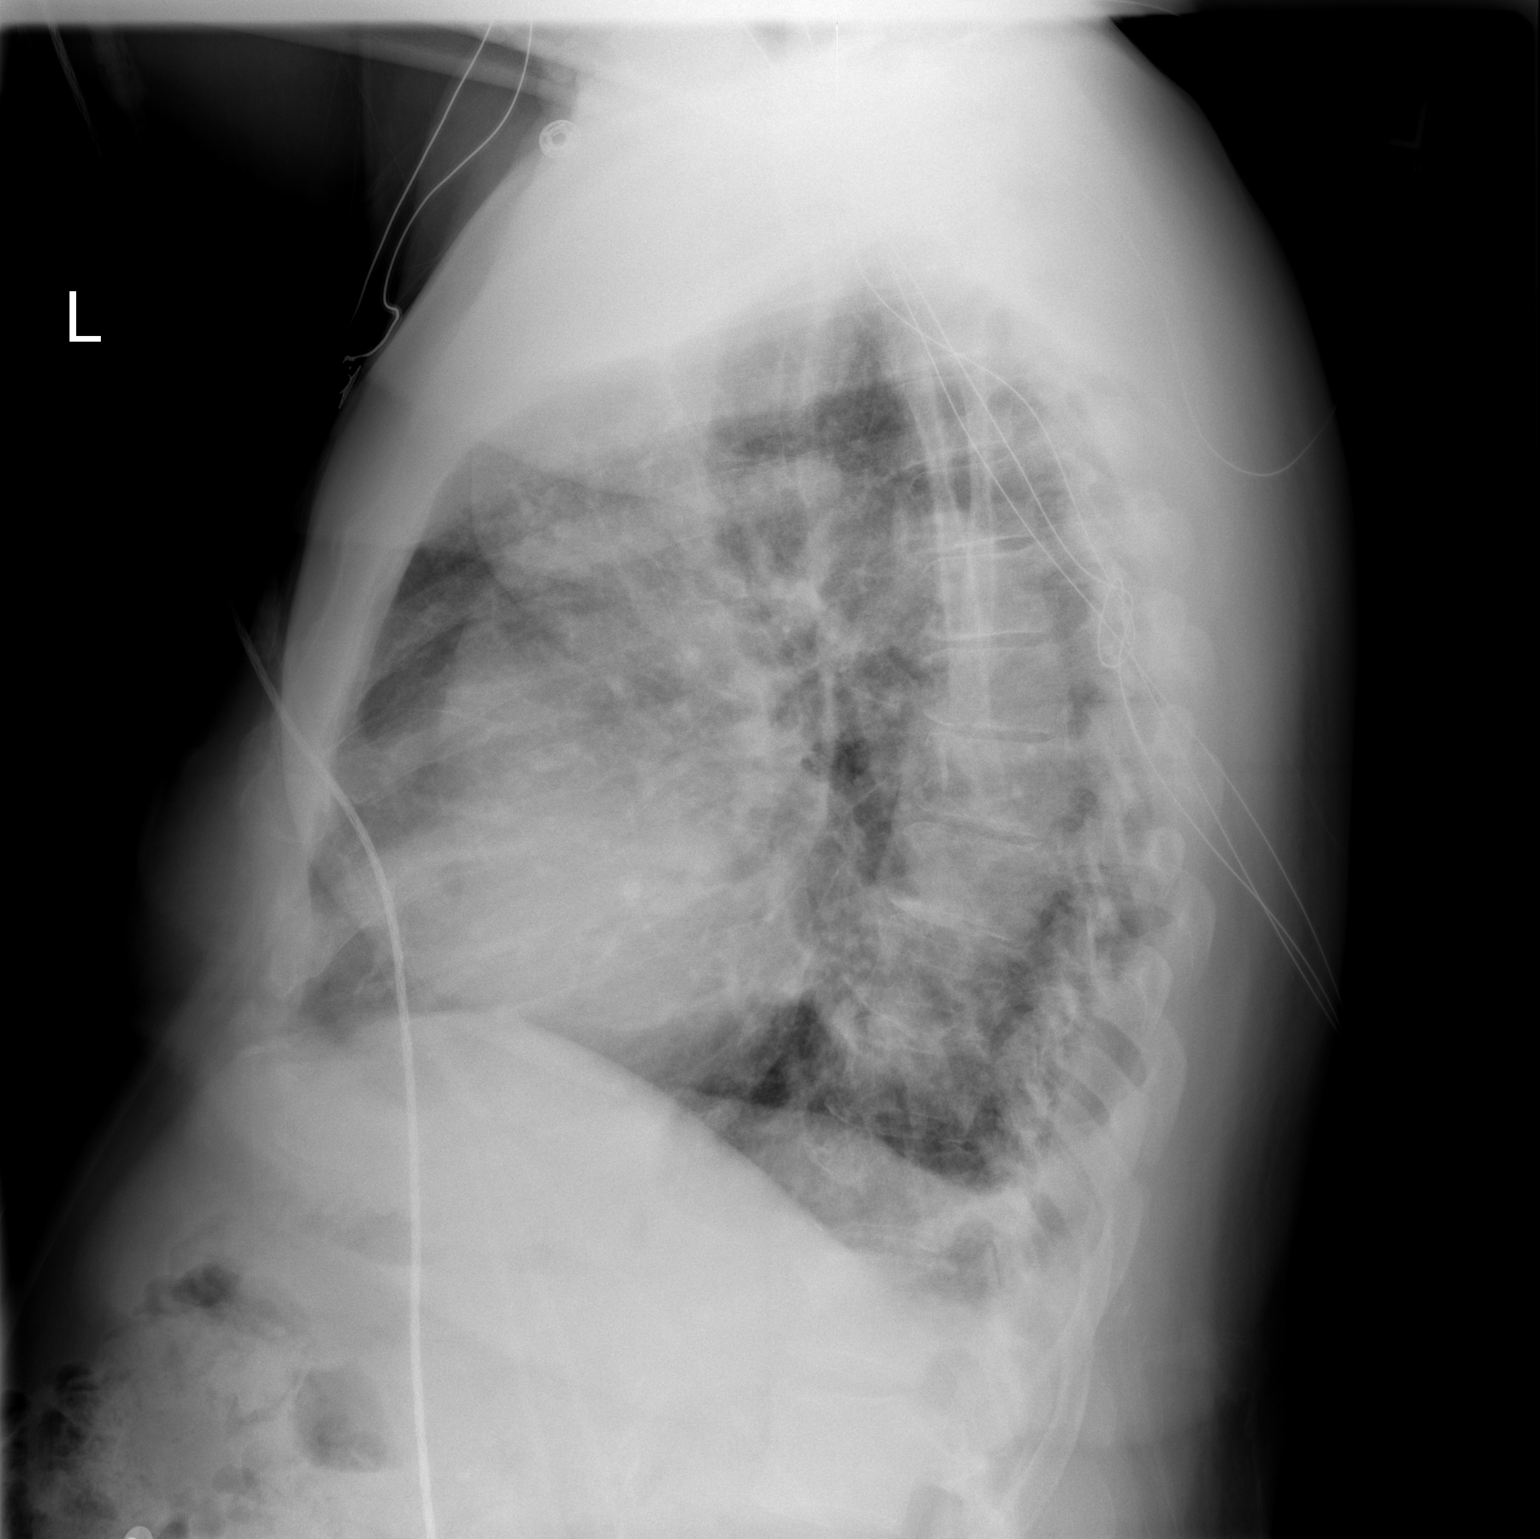

[2 of 2 positions shown; findings below may reference images not displayed]

FINDINGS: Two views of the chest show little change in prominent interstitial markings and small effusions most consistent with mild interstitial edema with cardiomegaly. No focal infiltrate is seen.
IMPRESSION: Little change in probable mild interstitial edema with small effusions.

## 2009-11-15 ENCOUNTER — Ambulatory Visit: Payer: Self-pay | Admitting: Oncology

## 2009-11-15 LAB — CBC WITH DIFFERENTIAL/PLATELET
BASO%: 0.5 % (ref 0.0–2.0)
Basophils Absolute: 0 10*3/uL (ref 0.0–0.1)
HCT: 36.4 % — ABNORMAL LOW (ref 38.4–49.9)
HGB: 11.8 g/dL — ABNORMAL LOW (ref 13.0–17.1)
MCHC: 32.5 g/dL (ref 32.0–36.0)
MONO#: 0.2 10*3/uL (ref 0.1–0.9)
NEUT%: 42.7 % (ref 39.0–75.0)
WBC: 7.9 10*3/uL (ref 4.0–10.3)
lymph#: 0.9 10*3/uL (ref 0.9–3.3)

## 2009-11-15 LAB — COMPREHENSIVE METABOLIC PANEL
AST: 21 U/L (ref 0–37)
Albumin: 4.6 g/dL (ref 3.5–5.2)
BUN: 19 mg/dL (ref 6–23)
Calcium: 9.7 mg/dL (ref 8.4–10.5)
Chloride: 104 mEq/L (ref 96–112)
Potassium: 4.3 mEq/L (ref 3.5–5.3)
Total Protein: 7.5 g/dL (ref 6.0–8.3)

## 2010-01-15 ENCOUNTER — Ambulatory Visit: Payer: Self-pay | Admitting: Oncology

## 2010-01-17 LAB — CBC WITH DIFFERENTIAL/PLATELET
HCT: 33.6 % — ABNORMAL LOW (ref 38.4–49.9)
HGB: 11 g/dL — ABNORMAL LOW (ref 13.0–17.1)
nRBC: 1 % — ABNORMAL HIGH (ref 0–0)

## 2010-01-17 LAB — MANUAL DIFFERENTIAL
ANC (CHCC manual diff): 2.8 10*3/uL (ref 1.5–6.5)
Band Neutrophils: 0 % (ref 0–10)
Blasts: 0 % (ref 0–0)
Other Cell: 0 % (ref 0–0)
PROMYELO: 0 % (ref 0–0)
SEG: 34 % — ABNORMAL LOW (ref 38–77)
nRBC: 1 % — ABNORMAL HIGH (ref 0–0)

## 2010-03-16 ENCOUNTER — Ambulatory Visit: Payer: Self-pay | Admitting: Oncology

## 2010-03-21 LAB — MANUAL DIFFERENTIAL
ALC: 1.7 10*3/uL (ref 0.9–3.3)
ANC (CHCC manual diff): 2.3 10*3/uL (ref 1.5–6.5)
Band Neutrophils: 5 % (ref 0–10)
LYMPH: 20 % (ref 14–49)
Metamyelocytes: 1 % — ABNORMAL HIGH (ref 0–0)
PLT EST: DECREASED

## 2010-03-21 LAB — CBC WITH DIFFERENTIAL/PLATELET
HCT: 34.9 % — ABNORMAL LOW (ref 38.4–49.9)
HGB: 11.6 g/dL — ABNORMAL LOW (ref 13.0–17.1)
MCH: 28.1 pg (ref 27.2–33.4)
Platelets: 103 10*3/uL — ABNORMAL LOW (ref 140–400)

## 2010-04-17 ENCOUNTER — Ambulatory Visit: Payer: Self-pay | Admitting: Oncology

## 2010-05-16 LAB — CBC WITH DIFFERENTIAL/PLATELET
BASO%: 0.1 % (ref 0.0–2.0)
EOS%: 64.1 % — ABNORMAL HIGH (ref 0.0–7.0)
HCT: 34.8 % — ABNORMAL LOW (ref 38.4–49.9)
MCH: 26.7 pg — ABNORMAL LOW (ref 27.2–33.4)
MCHC: 32.2 g/dL (ref 32.0–36.0)
MONO#: 0.2 10*3/uL (ref 0.1–0.9)
RBC: 4.19 10*6/uL — ABNORMAL LOW (ref 4.20–5.82)
RDW: 18.7 % — ABNORMAL HIGH (ref 11.0–14.6)
WBC: 8.6 10*3/uL (ref 4.0–10.3)
lymph#: 1 10*3/uL (ref 0.9–3.3)
nRBC: 0 % (ref 0–0)

## 2010-05-16 LAB — TECHNOLOGIST REVIEW

## 2010-06-11 ENCOUNTER — Ambulatory Visit: Payer: Self-pay | Admitting: Oncology

## 2010-07-11 ENCOUNTER — Ambulatory Visit (HOSPITAL_BASED_OUTPATIENT_CLINIC_OR_DEPARTMENT_OTHER): Payer: Medicare Other | Admitting: Oncology

## 2010-07-11 LAB — CBC WITH DIFFERENTIAL/PLATELET
BASO%: 0.2 % (ref 0.0–2.0)
Basophils Absolute: 0 10*3/uL (ref 0.0–0.1)
EOS%: 59.4 % — ABNORMAL HIGH (ref 0.0–7.0)
Eosinophils Absolute: 4.9 10*3/uL — ABNORMAL HIGH (ref 0.0–0.5)
HCT: 34 % — ABNORMAL LOW (ref 38.4–49.9)
HGB: 11.2 g/dL — ABNORMAL LOW (ref 13.0–17.1)
LYMPH%: 13.8 % — ABNORMAL LOW (ref 14.0–49.0)
MCH: 27 pg — ABNORMAL LOW (ref 27.2–33.4)
MCHC: 32.9 g/dL (ref 32.0–36.0)
MCV: 81.9 fL (ref 79.3–98.0)
MONO#: 0.2 10*3/uL (ref 0.1–0.9)
MONO%: 2.2 % (ref 0.0–14.0)
NEUT#: 2 10*3/uL (ref 1.5–6.5)
NEUT%: 24.4 % — ABNORMAL LOW (ref 39.0–75.0)
Platelets: 75 10*3/uL — ABNORMAL LOW (ref 140–400)
RBC: 4.15 10*6/uL — ABNORMAL LOW (ref 4.20–5.82)
RDW: 19.4 % — ABNORMAL HIGH (ref 11.0–14.6)
WBC: 8.3 10*3/uL (ref 4.0–10.3)
lymph#: 1.2 10*3/uL (ref 0.9–3.3)
nRBC: 0 % (ref 0–0)

## 2010-07-12 ENCOUNTER — Ambulatory Visit: Payer: Self-pay | Admitting: Cardiology

## 2010-07-31 NOTE — Progress Notes (Signed)
Summary: Schedule Colonoscopy  Phone Note Outgoing Call Call back at Home Phone [phone]   Call placed by: Harlow Mares CMA Duncan Dull),  August 24, 2009 2:23 PM Call placed to: Patient Summary of Call: patient would like to talk with his daughter and call back when she can bring him for his procedure.  Initial call taken by: Harlow Mares CMA Duncan Dull),  August 24, 2009 2:24 PM

## 2010-09-12 ENCOUNTER — Encounter (HOSPITAL_BASED_OUTPATIENT_CLINIC_OR_DEPARTMENT_OTHER): Payer: Medicare Other | Admitting: Oncology

## 2010-09-12 LAB — CBC WITH DIFFERENTIAL/PLATELET
BASO%: 0.3 % (ref 0.0–2.0)
Eosinophils Absolute: 6.2 10*3/uL — ABNORMAL HIGH (ref 0.0–0.5)
LYMPH%: 10.4 % — ABNORMAL LOW (ref 14.0–49.0)
MCHC: 33.1 g/dL (ref 32.0–36.0)
MONO#: 0.2 10*3/uL (ref 0.1–0.9)
NEUT#: 1.7 10*3/uL (ref 1.5–6.5)
Platelets: 100 10*3/uL — ABNORMAL LOW (ref 140–400)
RBC: 4.35 10*6/uL (ref 4.20–5.82)
RDW: 19.2 % — ABNORMAL HIGH (ref 11.0–14.6)
WBC: 9.1 10*3/uL (ref 4.0–10.3)
nRBC: 0 % (ref 0–0)

## 2010-10-08 LAB — URINALYSIS, ROUTINE W REFLEX MICROSCOPIC
Bilirubin Urine: NEGATIVE
Glucose, UA: NEGATIVE mg/dL
Ketones, ur: NEGATIVE mg/dL
Nitrite: NEGATIVE
Specific Gravity, Urine: 1.01 (ref 1.005–1.030)
pH: 5.5 (ref 5.0–8.0)

## 2010-10-08 LAB — HEPATIC FUNCTION PANEL
AST: 25 U/L (ref 0–37)
Albumin: 2.3 g/dL — ABNORMAL LOW (ref 3.5–5.2)
Bilirubin, Direct: 0.4 mg/dL — ABNORMAL HIGH (ref 0.0–0.3)
Total Bilirubin: 1.3 mg/dL — ABNORMAL HIGH (ref 0.3–1.2)

## 2010-10-08 LAB — CBC
HCT: 25.9 % — ABNORMAL LOW (ref 39.0–52.0)
Hemoglobin: 8.4 g/dL — ABNORMAL LOW (ref 13.0–17.0)
Hemoglobin: 8.5 g/dL — ABNORMAL LOW (ref 13.0–17.0)
Hemoglobin: 8.6 g/dL — ABNORMAL LOW (ref 13.0–17.0)
MCHC: 33.3 g/dL (ref 30.0–36.0)
MCV: 88.9 fL (ref 78.0–100.0)
Platelets: 72 10*3/uL — ABNORMAL LOW (ref 150–400)
Platelets: 81 10*3/uL — ABNORMAL LOW (ref 150–400)
Platelets: 83 10*3/uL — ABNORMAL LOW (ref 150–400)
Platelets: 95 10*3/uL — ABNORMAL LOW (ref 150–400)
RBC: 2.67 MIL/uL — ABNORMAL LOW (ref 4.22–5.81)
RBC: 2.91 MIL/uL — ABNORMAL LOW (ref 4.22–5.81)
RBC: 2.92 MIL/uL — ABNORMAL LOW (ref 4.22–5.81)
RDW: 17.6 % — ABNORMAL HIGH (ref 11.5–15.5)
RDW: 18 % — ABNORMAL HIGH (ref 11.5–15.5)
WBC: 6.7 10*3/uL (ref 4.0–10.5)
WBC: 7 10*3/uL (ref 4.0–10.5)
WBC: 7.1 10*3/uL (ref 4.0–10.5)

## 2010-10-08 LAB — DIC (DISSEMINATED INTRAVASCULAR COAGULATION)PANEL
Fibrinogen: 741 mg/dL — ABNORMAL HIGH (ref 204–475)
INR: 1.5 (ref 0.00–1.49)
Platelets: 92 10*3/uL — ABNORMAL LOW (ref 150–400)
aPTT: 55 seconds — ABNORMAL HIGH (ref 24–37)

## 2010-10-08 LAB — DIFFERENTIAL
Band Neutrophils: 0 % (ref 0–10)
Blasts: 0 %
Eosinophils Absolute: 1.5 10*3/uL — ABNORMAL HIGH (ref 0.0–0.7)
Eosinophils Relative: 22 % — ABNORMAL HIGH (ref 0–5)
Lymphocytes Relative: 2 % — ABNORMAL LOW (ref 12–46)
Lymphs Abs: 0.1 10*3/uL — ABNORMAL LOW (ref 0.7–4.0)
Monocytes Absolute: 0.3 10*3/uL (ref 0.1–1.0)
Monocytes Relative: 4 % (ref 3–12)
nRBC: 0 /100 WBC

## 2010-10-08 LAB — GLUCOSE, CAPILLARY
Glucose-Capillary: 134 mg/dL — ABNORMAL HIGH (ref 70–99)
Glucose-Capillary: 137 mg/dL — ABNORMAL HIGH (ref 70–99)
Glucose-Capillary: 142 mg/dL — ABNORMAL HIGH (ref 70–99)
Glucose-Capillary: 142 mg/dL — ABNORMAL HIGH (ref 70–99)
Glucose-Capillary: 143 mg/dL — ABNORMAL HIGH (ref 70–99)
Glucose-Capillary: 147 mg/dL — ABNORMAL HIGH (ref 70–99)
Glucose-Capillary: 168 mg/dL — ABNORMAL HIGH (ref 70–99)
Glucose-Capillary: 179 mg/dL — ABNORMAL HIGH (ref 70–99)
Glucose-Capillary: 180 mg/dL — ABNORMAL HIGH (ref 70–99)
Glucose-Capillary: 185 mg/dL — ABNORMAL HIGH (ref 70–99)
Glucose-Capillary: 192 mg/dL — ABNORMAL HIGH (ref 70–99)

## 2010-10-08 LAB — URINE MICROSCOPIC-ADD ON

## 2010-10-08 LAB — CROSSMATCH

## 2010-10-08 LAB — DIRECT ANTIGLOBULIN TEST (NOT AT ARMC)
DAT, IgG: NEGATIVE
DAT, complement: NEGATIVE

## 2010-10-08 LAB — IRON AND TIBC
Iron: 15 ug/dL — ABNORMAL LOW (ref 42–135)
Saturation Ratios: 10 % — ABNORMAL LOW (ref 20–55)
TIBC: 152 ug/dL — ABNORMAL LOW (ref 215–435)
UIBC: 137 ug/dL

## 2010-10-08 LAB — COMPREHENSIVE METABOLIC PANEL
BUN: 20 mg/dL (ref 6–23)
CO2: 26 mEq/L (ref 19–32)
Chloride: 105 mEq/L (ref 96–112)
Creatinine, Ser: 1.26 mg/dL (ref 0.4–1.5)
GFR calc non Af Amer: 56 mL/min — ABNORMAL LOW (ref >60)
Total Bilirubin: 0.9 mg/dL (ref 0.3–1.2)

## 2010-10-08 LAB — BASIC METABOLIC PANEL
Calcium: 8.5 mg/dL (ref 8.4–10.5)
Creatinine, Ser: 1.26 mg/dL (ref 0.4–1.5)
GFR calc Af Amer: >60 mL/min (ref >60)
GFR calc non Af Amer: 56 mL/min — ABNORMAL LOW (ref >60)

## 2010-10-08 LAB — CULTURE, BLOOD (ROUTINE X 2)

## 2010-10-08 LAB — PROTIME-INR
INR: 1.4 (ref 0.00–1.49)
INR: 3.2 — ABNORMAL HIGH (ref 0.00–1.49)
Prothrombin Time: 17.8 seconds — ABNORMAL HIGH (ref 11.6–15.2)
Prothrombin Time: 35.6 seconds — ABNORMAL HIGH (ref 11.6–15.2)
Prothrombin Time: 36.5 seconds — ABNORMAL HIGH (ref 11.6–15.2)

## 2010-10-08 LAB — HAPTOGLOBIN: Haptoglobin: 326 mg/dL — ABNORMAL HIGH (ref 16–200)

## 2010-10-08 LAB — APTT: aPTT: 52 seconds — ABNORMAL HIGH (ref 24–37)

## 2010-10-08 LAB — FIBRINOGEN: Fibrinogen: >800 mg/dL — ABNORMAL HIGH (ref 204–475)

## 2010-10-08 LAB — D-DIMER, QUANTITATIVE: D-Dimer, Quant: 2.88 ug/mL-FEU — ABNORMAL HIGH (ref 0.00–0.48)

## 2010-10-08 LAB — LACTATE DEHYDROGENASE: LDH: 224 U/L (ref 94–250)

## 2010-10-09 LAB — TYPE AND SCREEN: Antibody Screen: NEGATIVE

## 2010-10-09 LAB — CBC
HCT: 21.3 % — ABNORMAL LOW (ref 39.0–52.0)
HCT: 23.5 % — ABNORMAL LOW (ref 39.0–52.0)
HCT: 26.4 % — ABNORMAL LOW (ref 39.0–52.0)
Hemoglobin: 11 g/dL — ABNORMAL LOW (ref 13.0–17.0)
Hemoglobin: 8.6 g/dL — ABNORMAL LOW (ref 13.0–17.0)
MCHC: 32.7 g/dL (ref 30.0–36.0)
MCV: 86.6 fL (ref 78.0–100.0)
Platelets: 65 10*3/uL — ABNORMAL LOW (ref 150–400)
Platelets: 70 10*3/uL — ABNORMAL LOW (ref 150–400)
Platelets: 74 10*3/uL — ABNORMAL LOW (ref 150–400)
RBC: 3.01 MIL/uL — ABNORMAL LOW (ref 4.22–5.81)
RBC: 3.94 MIL/uL — ABNORMAL LOW (ref 4.22–5.81)
RDW: 17.4 % — ABNORMAL HIGH (ref 11.5–15.5)
RDW: 19.6 % — ABNORMAL HIGH (ref 11.5–15.5)
RDW: 19.7 % — ABNORMAL HIGH (ref 11.5–15.5)
RDW: 20.8 % — ABNORMAL HIGH (ref 11.5–15.5)
WBC: 5.3 10*3/uL (ref 4.0–10.5)
WBC: 6.3 10*3/uL (ref 4.0–10.5)
WBC: 6.4 10*3/uL (ref 4.0–10.5)

## 2010-10-09 LAB — DIFFERENTIAL
Basophils Absolute: 0 10*3/uL (ref 0.0–0.1)
Basophils Relative: 0 % (ref 0–1)
Myelocytes: 0 %
Neutro Abs: 2.5 10*3/uL (ref 1.7–7.7)
Neutrophils Relative %: 37 % — ABNORMAL LOW (ref 43–77)
Promyelocytes Absolute: 0 %

## 2010-10-09 LAB — COMPREHENSIVE METABOLIC PANEL
ALT: 12 U/L (ref 0–53)
ALT: 15 U/L (ref 0–53)
AST: 20 U/L (ref 0–37)
Albumin: 2.6 g/dL — ABNORMAL LOW (ref 3.5–5.2)
Alkaline Phosphatase: 33 U/L — ABNORMAL LOW (ref 39–117)
Alkaline Phosphatase: 46 U/L (ref 39–117)
BUN: 20 mg/dL (ref 6–23)
CO2: 26 mEq/L (ref 19–32)
Chloride: 104 mEq/L (ref 96–112)
Chloride: 105 mEq/L (ref 96–112)
Glucose, Bld: 118 mg/dL — ABNORMAL HIGH (ref 70–99)
Potassium: 3.6 mEq/L (ref 3.5–5.1)
Potassium: 3.7 mEq/L (ref 3.5–5.1)
Sodium: 139 mEq/L (ref 135–145)
Sodium: 141 mEq/L (ref 135–145)
Total Bilirubin: 0.6 mg/dL (ref 0.3–1.2)
Total Bilirubin: 0.9 mg/dL (ref 0.3–1.2)
Total Protein: 7.3 g/dL (ref 6.0–8.3)

## 2010-10-09 LAB — BASIC METABOLIC PANEL
BUN: 18 mg/dL (ref 6–23)
CO2: 28 mEq/L (ref 19–32)
Calcium: 8.6 mg/dL (ref 8.4–10.5)
Calcium: 8.9 mg/dL (ref 8.4–10.5)
GFR calc non Af Amer: 48 mL/min — ABNORMAL LOW (ref >60)
Glucose, Bld: 139 mg/dL — ABNORMAL HIGH (ref 70–99)
Glucose, Bld: 165 mg/dL — ABNORMAL HIGH (ref 70–99)
Sodium: 139 mEq/L (ref 135–145)

## 2010-10-09 LAB — GLUCOSE, CAPILLARY
Glucose-Capillary: 145 mg/dL — ABNORMAL HIGH (ref 70–99)
Glucose-Capillary: 159 mg/dL — ABNORMAL HIGH (ref 70–99)
Glucose-Capillary: 162 mg/dL — ABNORMAL HIGH (ref 70–99)
Glucose-Capillary: 162 mg/dL — ABNORMAL HIGH (ref 70–99)
Glucose-Capillary: 187 mg/dL — ABNORMAL HIGH (ref 70–99)

## 2010-10-09 LAB — URINALYSIS, ROUTINE W REFLEX MICROSCOPIC
Glucose, UA: NEGATIVE mg/dL
Glucose, UA: NEGATIVE mg/dL
Ketones, ur: NEGATIVE mg/dL
Leukocytes, UA: NEGATIVE
Nitrite: NEGATIVE
Protein, ur: NEGATIVE mg/dL
Specific Gravity, Urine: 1.018 (ref 1.005–1.030)
pH: 5.5 (ref 5.0–8.0)

## 2010-10-09 LAB — URINE MICROSCOPIC-ADD ON

## 2010-10-09 LAB — CARDIAC PANEL(CRET KIN+CKTOT+MB+TROPI)
CK, MB: 1 ng/mL (ref 0.3–4.0)
Relative Index: 0.9 (ref 0.0–2.5)
Total CK: 139 U/L (ref 7–232)
Troponin I: 0.02 ng/mL (ref 0.00–0.06)

## 2010-10-09 LAB — PROTIME-INR
INR: 1.1 (ref 0.00–1.49)
INR: 1.4 (ref 0.00–1.49)
INR: 2.5 — ABNORMAL HIGH (ref 0.00–1.49)
Prothrombin Time: 14.4 seconds (ref 11.6–15.2)
Prothrombin Time: 17.6 seconds — ABNORMAL HIGH (ref 11.6–15.2)

## 2010-10-09 LAB — ABO/RH: ABO/RH(D): A POS

## 2010-10-09 LAB — HEMOGLOBIN AND HEMATOCRIT, BLOOD: HCT: 30.9 % — ABNORMAL LOW (ref 39.0–52.0)

## 2010-10-11 LAB — DIFFERENTIAL
Basophils Absolute: 0.1 10*3/uL (ref 0.0–0.1)
Basophils Relative: 1 % (ref 0–1)
Eosinophils Absolute: 3.1 10*3/uL — ABNORMAL HIGH (ref 0.0–0.7)
Eosinophils Relative: 48 % — ABNORMAL HIGH (ref 0–5)
Metamyelocytes Relative: 0 %
Myelocytes: 0 %
Neutro Abs: 2.3 10*3/uL (ref 1.7–7.7)
Neutrophils Relative %: 34 % — ABNORMAL LOW (ref 43–77)
Promyelocytes Absolute: 0 %

## 2010-10-11 LAB — BONE MARROW EXAM

## 2010-10-11 LAB — CBC
Hemoglobin: 11.5 g/dL — ABNORMAL LOW (ref 13.0–17.0)
MCHC: 33 g/dL (ref 30.0–36.0)
MCV: 86.2 fL (ref 78.0–100.0)
RDW: 19.9 % — ABNORMAL HIGH (ref 11.5–15.5)

## 2010-10-11 LAB — CHROMOSOME ANALYSIS, BONE MARROW

## 2010-11-13 NOTE — Discharge Summary (Signed)
[name], [name] NO.:  [id number]   MEDICAL RECORD NO.:  [id number]          PATIENT TYPE:  INP   LOCATION:  2011                         FACILITY:  [facility]   PHYSICIAN:  [name], M.D.     DATE OF BIRTH:  Jun 10, 1934   DATE OF ADMISSION:  06/28/2007  DATE OF DISCHARGE:  07/11/2007                               DISCHARGE SUMMARY   HISTORY OF PRESENT ILLNESS:  The patient is a 77 year old black male who  has been in his usual state of health up until approximately 2-3 weeks  prior to this admission.  At that time, he began to develop episodes of  chest discomfort.  The patient has multiple cardiovascular risk factors.  On the date of admission, he presented to the emergency room after  having several episodes of chest discomfort.  Initially, he awoke in the  middle of the night with chest pain associated with diaphoresis.  His  symptoms improved after he sat up and went to the bathroom.  He denied  shortness of breath, nausea or vomiting.  The symptoms lasted  approximately 5 minutes.  He returned to sleep, but again the chest pain  awoke him approximately 6 a.m. on the day of admission.  He had a third  episode and then decided to go to the emergency department.  He was seen  in the emergency department and felt to require further evaluation and  treatment.  Initial CK and troponin markers were negative.  He did show  evidence of anemia with hemoglobin of 10 on presentation.  EKG initially  showed lateral T-wave changes.  He was seen in consultation by Dr.  [name] and he was placed on intravenous nitroglycerin.  Cardiac enzymes  were to be checked in a serial manner.  He was felt to require further  study to include cardiac catheterization.   PAST MEDICAL HISTORY:  1. Hypertension.  2. Non-insulin-dependent diabetes mellitus.  3. History of gout.  4. History of benign prostatic hyperplasia.  5. Questionable history of arrhythmia in the remote past with  the      details of this unknown.   PAST SURGICAL HISTORY:  Surgical repair of a broken thumb.   ALLERGIES:  NO KNOWN DRUG ALLERGIES.   MEDICATIONS PRIOR TO ADMISSION:  1. Lasix 80 mg b.i.d.  2. Zocor 40 mg daily.  3. Atenolol 100 mg daily.  4. Lisinopril 40 mg daily.  5. Metformin 1,000 mg b.i.d.  6. K-Dur 20 mEq b.i.d.  7. Cardura 4 mg daily.  8. Allopurinol 300 mg daily.  9. Adalat 90 mg daily.  10.Aspirin 1 daily.   Family history, social history, review of systems, physical examination,  please see the history and physical done at the time of admission.   HOSPITAL COURSE:  The patient was admitted as described.  He did have  some elevation in his troponins to a peak level of 0.011.  He was also  started on heparin.  He had an episode of chest pain and therefore was  also started on Integrilin.  Cardiac catheterization was done on  June 29, 2007 and he was found to have severe 3-vessel coronary  artery disease.  It was difficult to evaluate his left ventricular  function.  He did have root dilatation and dilated ascending aorta.  Cardiac surgery consultation was obtained with Sheliah Plane, MD who  evaluated the patient and studies.  It was his opinion that  consideration of coronary artery bypass surgery was warranted.  It was  felt he required further evaluation of his LV function and aortic size  by echocardiogram.  This was done and it showed normal left ventricular  function with an ejection fraction of 55+%.  He did have mild LVH.  Mild-  moderate sclerosis of the aortic valve.  Mild aortic root dilatation at  the level of the sinuses and no aortic insufficiency.  The patient was  felt to be an acceptable candidate for proceeding with surgery.  This  was scheduled.  However on July 02, 2007, he did become febrile.  There was some concern of his pulmonary status and he was started on  Avelox.  He also had some evidence of phlebitis from a right arm IV  site.   He did defervesce.  He was deemed acceptable to proceed with  surgery.   On July 06, 2007, he was taken to the operating room at which time he  underwent the following procedure:  Coronary artery bypass grafting x 4.  The following grafts were placed:  1. Left internal mammary artery to the LAD.  2. Saphenous vein graft to the obtuse marginal.  3. Saphenous vein graft to the posterior descending.  4. Saphenous vein graft to the diagonal.   The patient tolerated the procedure well and was taken to the surgical  intensive care unit in stable condition.   POSTOPERATIVE HOSPITAL COURSE:  Overall, the patient has done well.  He  has maintained anemia.  This is acute blood loss anemia in association  with previous anemia of undetermined etiology.  He does have some iron  deficiency per his labs.  He has been started on oral supplement.  He  has a normocytic anemia.  His Hemoccults have been negative for blood in  his stool.  He has been transfused during the postoperative period.  His  most recent value dated July 10, 2007 showed a hemoglobin of 7.7.  He  does have some RBCs in his urine.  It is undetermined if this is related  to Foley trauma and associated anticoagulation during the time of  surgery.  He will require further evaluation of his anemia as an  outpatient.  All routine lines, monitors and drain devices have been  discontinued in a standard fashion.  His incisions are healing well.  He  does have moderate ecchymosis of the right thigh where his saphenous  vein was retrieved endoscopically.  There appears to be no evidence of  infection at this time.  His postoperative volume overload has improved  with diuresis.  Oxygen has been weaned and he maintains good saturation  on room air.  He has maintained sinus rhythm without significant ectopy  or dysrhythmias.  He has tolerated a rapid advancement in activity using  standard protocols.  Overall, his status is felt to be  stable for  tentative discharge the morning of July 11, 2007 pending morning  round re-evaluation.   CONDITION ON DISCHARGE:  Stable and improving.   FINAL DIAGNOSIS:  Severe coronary artery disease as described.  Now  status post surgical revascularization also as  described.  For further  details of his coronary anatomy, please see the cardiac catheterization  report.  Also noted were elevation of his troponin enzymes  preoperatively.   OTHER DIAGNOSES:  1. Anemia, acute blood loss on chronic anemia of undetermined etiology      that will require further outpatient evaluation.  2. Hypertension.  3. Diabetes mellitus.  4. Preoperative pneumonia.   DISCHARGE INSTRUCTIONS:  The patient will receive written instructions  regarding medications, activity, diet, wound care and followup.   FOLLOW UP:  1. Dr. Laneta Simmers on July 28, 2007 at 11 a.m.  2. Additionally, he is instructed to follow up with Dr. [name] in 2      weeks.  3. Follow up with his family physician for further evaluation of his      anemia.   DISCHARGE MEDICATIONS:  1. Aspirin 325 mg daily.  2. Lopressor 25 mg twice daily.  3. Zocor 40 mg daily.  4. Ferrous gluconate 324 mg twice daily.  5. Avelox 400 mg daily for 5 additional days.  6. Glucophage 1,000 mg twice daily.  7. Lasix 80 mg daily for 5 days.  8. K-Dur 40 mEq daily for 5 days.  9. For pain, oxycodone 5 mg 1-2 every 4-6 h. as needed for pain.      Rowe Clack, P.A.-C.      [name], M.D.  Electronically Signed    WEG/MEDQ  D:  07/10/2007  T:  07/10/2007  Job:  [id number]   cc:   [name], M.D.  [name]. [name], M.D.  Vianne Bulls, M.D.

## 2010-11-13 NOTE — H&P (Signed)
[name], [name] NO.:  [id number]   MEDICAL RECORD NO.:  [id number]          PATIENT TYPE:  EMS   LOCATION:  ED                           FACILITY:  [facility]   PHYSICIAN:  [name]. [name], M.D.DATE OF BIRTH:  March 29, 1934   DATE OF ADMISSION:  06/28/2007  DATE OF DISCHARGE:                              HISTORY & PHYSICAL   CHIEF COMPLAINT:  Chest pain.   HISTORY OF PRESENT ILLNESS:  Mr. [name] is a very pleasant 77 year old  black male who has been in his usual state of health, up until the last  2 to 3 weeks ago.  He has been having slight episodes of chest  discomfort that resolved with sitting up.  He has multiple  cardiovascular risk factors.  Today, he presents to the emergency room  after having three individual episodes of chest discomfort.  He  initially woke up in the middle of the  night with chest pain.  He had  slight diaphoresis.  It resolved after he sat up.  He went on to the  bathroom and then was able to go back to bed.  He had no shortness of  breath, nausea or vomiting.  The exact duration was probably about 5 to  6 minutes.  He was able to go back to sleep, yet his discomfort returned  and woke him up again and 6:00 a.m.  This again went away after sitting  up at the side of the bed.  He was able to go back to sleep for a second  time and then at approximately 8:30 a.m., he had his third episode.  He  alerted his wife who brought him to the emergency room.  He is currently  pain free.   PAST MEDICAL HISTORY:  He has had surgery for a previous broken thumb.  He has had hypertension, since he was in his 64s.  He has been diabetic  over the past 3 to 4 years.  He has had gout, BPH.  There is a question  of arrhythmia in the remote past.  The details of that are unknown.   ALLERGIES:  HIS ALLERGIES ARE NONE.   CURRENT MEDICATIONS:  1. Lasix 80 b.i.d.  2. Zocor 40 a day.  3. Atenolol 100 mg a day.  4. Lisinopril 40 mg a day.  5. Metformin  1000 b.i.d.  6. K-Dur 20 b.i.d.  7. Cardura 4 mg a day.  8. Allopurinol 300 mg a day.  9. Adalat 90 mg a day.  10.Aspirin daily.   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  He is  married with four children.  He stopped smoking in 1982 or 1983,  but  prior to that had smoked for approximately 30 years.  He denies alcohol  use.   FAMILY HISTORY:  His mother died at 71 and had heart disease.  He has  had one brother who died secondary to exposure to agent orange.   REVIEW OF SYSTEMS:  He  does admit to dietary indiscretion.  He has had  some reflux-type symptoms.  He does not really engage in any type of  exercise program.  He had complaints of palpitations several years ago  and spent several days in the hospital.  He has had a cough that has  been more dry in nature.  He is on ACE inhibitor.  He does have some  problems with constipation.  He has had no headache, syncope, recent  fever, flu or cough.  His chest discomfort just started approximately 2  to 3 weeks ago.   PHYSICAL EXAMINATION:  GENERAL:  On exam, he is a very pleasant, he is  in no acute distress.  Multiple family members are present.  VITAL SIGNS:  Blood pressure is 150/75, heart rate in the 70s,  respiratory rate is 18.  He is afebrile.  SKIN:  Warm and dry.  Color is unremarkable.  LUNGS:  Clear.  HEART:  Shows a regular rhythm.  ABDOMEN:  Soft with positive bowel sounds, nontender.  EXTREMITIES:  Full.  There is really no significant edema.  NEUROLOGIC:  Shows no gross focal deficits.   PERTINENT LABS:  First CK and troponin markers are negative.  CBC shows  a hemoglobin of 10, hematocrit is 30, white count 5, platelets are 121.  Chemistries show a BUN of 24, creatinine 1, glucose 127, potassium is  4.1.  His EKG shows lateral T-wave changes.   OVERALL IMPRESSION:  1. Chest pain.  2. Hypertension.  3. Diabetes.   4. Hyperlipidemia.  5. Remote tobacco abuse.   PLAN:  He will be admitted to [facility], will place him on IV  nitroglycerin.  Cardiac enzymes will be checked in a serial manner.  More than likely will need to undergo cardiac catheterization tomorrow.      Sharlee Blew, N.P.      [name]. [name], M.D.  Electronically Signed    LC/MEDQ  D:  06/28/2007  T:  06/28/2007  Job:  [id number]   cc:   C. Duane Lope, M.D.  [name]. [name], M.D.

## 2010-11-13 NOTE — Cardiovascular Report (Signed)
[name], [name] NO.:  [id number]   MEDICAL RECORD NO.:  [id number]          PATIENT TYPE:  INP   LOCATION:  2914                         FACILITY:  [facility]   PHYSICIAN:  [name]. [name], M.D.DATE OF BIRTH:  08/26/1933   DATE OF PROCEDURE:  06/29/2007  DATE OF DISCHARGE:                            CARDIAC CATHETERIZATION   PROCEDURE:  Left heart catheterization with selective coronary  angiography, left ventricular angiography.   TYPE AND SITE OF ENTRY:  Percutaneous right femoral artery.   CATHETERS:  A 6-French left fixed curve Judkins coronary cath, a  __________  6-French right coronary catheter, a 6-French pigtail  ventriculographic cath.   CONTRAST:  Pure Omnipaque 160 mL.   MEDICATIONS GIVEN PRIOR TO PROCEDURE:  Valium 10 mg b.i.d.   MEDICATIONS GIVEN DURING PROCEDURE:  Versed 2 mg IV.   COMMENTS:  Patient tolerated the procedure well.  It was a complicated  procedure, in large part of because of extensive calcification and  tortuosity of the iliac vessels, as well as dilatation of the aortic  root and distorted architecture thereof.  The coronary arteries were  heavily calcified as well.   HEMODYNAMIC DATA:  Aortic pressure was 130/80, LV was 130/5-20.  There  was no aortic valve gradient noted on pullback.   CORONARY ARTERIES:  1. Left main coronary artery short and normal.  2. Left circumflex:  Left circumflex is a moderate-sized vessel.  It      is heavily calcified.  There is 60 to 70% narrowing in the first 1      to 2 cm of the vessel.  There is then a very acute angle and then a      95% stenosis before the steep angulation, and then 95% stenosis      thereafter.  Further down the vessel in between the 2 marginal      branches, there is a focal 90% stenosis.  3. Left anterior descending:  Left anterior descending is heavily      calcified.  There is severe segmental narrowing in the mid portion      of the vessel to be  approximately 80%.  There is a 90% narrowing in      a moderate-sized 2nd diagonal vessel.  This diagonal has an early      bifurcating branch.  4. Right coronary artery:  The right coronary artery is a large      dominant vessel.  It is very tortuous and there is a calcified      Shepherd's crook tortuosity in the proximal segment.  There is      approximately 50% narrowing in the body of the [employer].      The posterior descending vessel is relatively small, but there is      at least sequential 60 to 70% narrowing in the posterior descending      vessel.  There is atherosclerosis in the continuation branch of the      right coronary artery with no severe focal stenosis in the      posterior lateral branch.  A left ventricular angiogram was performed in the RAO position.  It was  performed on 2 occasions, but neither left ventricular angiogram was  excellent in definition of left ventricular function.  This was in large  part due to the distortion from the aortic root.  We did not repeat a  full left ventriculogram because of desires to limit contrast.  On the  2nd ventriculogram, there is a what appears be satisfactory filling, and  ejection fraction would be estimated to be in the 55 to 60% range.  The  aortic root is not well defined.  On the first ventriculogram, however,  the aortic root is dilated.  This would confirm the appropriate  necessary larger curved Judkins left catheters.   OVERALL IMPRESSION:  1. Probable normal left ventricular function and dilated aortic root.  2. Extensive and heavy calcification of coronary arteries.  3. Severe sequential stenosis in the left circumflex vessel with      severe stenosis in the mid left anterior descending involving the      origin of a moderate-sized diagonal vessel with moderate stenosis      in the mid posterior descending vessel.  4. Extensive calcification of very tortuous aortoiliac vasculature.   In light of these  findings, we will refer Mr. [name] for coronary  artery bypass grafting.  We still have issues with anemia as well as  further definition of aortic root diameter and left ventricular  function.      [name]. [name], M.D.  Electronically Signed     SNT/MEDQ  D:  06/30/2007  T:  06/30/2007  Job:  [id number]   cc:   [name], M.D.

## 2010-11-13 NOTE — Op Note (Signed)
NAMEJAYLENN, ALTIER                 ACCOUNT NO.:  [id number]   MEDICAL RECORD NO.:  [id number]          PATIENT TYPE:  INP   LOCATION:  1527                         [facility]AC[facility]   PHYSICIAN:  [name], M.D.DATE OF BIRTH:  February 28, 1934   DATE OF PROCEDURE:  11/25/2008  DATE OF DISCHARGE:                               OPERATIVE REPORT   SURGEON:  Dr. Darrelyn Hillock.   ASSISTANT:  Dr. Marlowe Kays.   PREOPERATIVE DIAGNOSES:  Severe degenerative arthritis with a varus  deformity of the right knee.   POSTOPERATIVE DIAGNOSES:  Severe degenerative arthritis with a varus  deformity of the right knee.   OPERATION:  Right total knee arthroplasty utilizing the DePuy system.  Vancomycin was used in the cement.  I used a size 4 right femoral  component posterior stabilized.  I utilized a 10-mm thickness size 4  rotating platform insert.  The tibial tray was a size 4 tibial tray and  the patella was a size 41.  All three components were cemented.   DESCRIPTION OF PROCEDURE:  Under general anesthesia, routine orthopedic  prep and draping of the right lower extremity was carried out.  He was  given 2 grams of IV Ancef preop.  At this time after sterile prep and  draping was carried out, the leg was exsanguinated with an Esmarch and  the tourniquet was elevated at 350 mmHg.  Following that, the knee was  flexed.  An incision was made over the anterior aspect of the left knee.  Bleeders identified and cauterized.  Two flaps were created.  I then  carried out a median parapatellar incision.  I reflected the patella  laterally and I did medial and lateral meniscectomies and excised the  anterior and posterior cruciate ligaments.  Following that, I made my  initial drill hole in the intercondylar notch.  We irrigated out the  femoral canal after the canal finder was inserted.  Following that, I  removed 12 mm thickness off the distal femur in the usual fashion.  Also, we then went down and  measured the femur to be a size 4.  Our jig  was inserted for a size 4 and we carried out our anterior, posterior and  chamfer cuts in the usual fashion.  After the femur was prepared, we  then went down and prepared the tibia.  I utilized the intramedullary  guide devices and removed 4 mm thickness off the affected medial side.  After that, we then cut our keel cut in the usual fashion for the tibia.  We did measure the tibial tray to be a size 4.  We then went back cut  our notch cut out of the femur in the usual fashion.  We then went  through our trials and had excellent stability.  We selected a size 4  ten mm thickness insert.  We then cut our patella and did a resurfacing  procedure on the patella for a size 41-mm patella.  Three drill holes  were made in the patella articular surface.  Following that, we then  removed all  the trial components, water picked the knee out and cemented  all three components in simultaneously.  After that, we then made sure  all loose pieces of cement were removed.  The size 4 insert fit quite  nicely.  We then irrigated out the knee and checked for loose pieces of  cement.  Following that after we checked for loose pieces of cement,  irrigated the knee out and inserted our permanent rotating platform,  size 4 ten mm thickness.  We reduced the knee and had excellent  stability, excellent motion.  The patella tracked  well in the midline.  We water picked the knee out again, inserted a  Hemovac drain and closed the knee in the usual fashion.  The skin was  closed metal staples.  A sterile Neosporin dressing was applied.  The  patient left the operative in satisfactory condition.           ______________________________  Georges Lynch Darrelyn Hillock, M.D.     RAG/MEDQ  D:  11/25/2008  T:  11/26/2008  Job:  [id number]   cc:   [name]. [name], M.D.  Fax: [phone]

## 2010-11-13 NOTE — Consult Note (Signed)
[name], [name]                 ACCOUNT NO.:  [id number]   MEDICAL RECORD NO.:  [id number]          PATIENT TYPE:  INP   LOCATION:  1441                         FACILITY:  [facility]   PHYSICIAN:  [name], M.D.DATE OF BIRTH:  May 31, 1934   DATE OF CONSULTATION:  DATE OF DISCHARGE:                                 CONSULTATION   [name] was admitted and taken to surgery on Nov 25, 2008.  He  had degenerative arthritis of the right knee and I did a right total  knee arthroplasty.  Following that, postoperative x-ray looked  excellent.   Cardiologists were Dr. [name] and Dr. Lucina Mellow.  Postoperatively, he was  maintained on the Coumadin, heparin protocol.  On Nov 26, 2008, it was  noted that he was developing a problem with his INR.  His INR remained  high at 3.5, 3.4 and 3.3 on successive dates.  His platelet count when  he was admitted was 130,000, but gradually decreased down into the  70,000 range.  Because of that, he required several blood transfusions.  He had at least initially 5 units of packed cells followed by another 2  units of packed cells and the bleeding actually was into the thigh.  He  remained a good blood pressure.  On May 29, the blood pressure was  115/67.  His hemoglobin dropped that day to 7.6 and he was given 2 units  of packed cells.  I did follow him daily.  On Nov 27, 2008 hemoglobin  came up to 8.4.  Creatinine was 1.4.  On 05/30, we had [place] see him  for home evaluation.  On successive days, as I mentioned, his hemoglobin  would drop and his platelets would drop.  Finally the INR, I spoke to  the pharmacist and decided to ago and give him 2 mg of vitamin K that  restored his INR down to about 1.4, and now we had much better control  because his platelets really were on the rise at that time.   The patient has remained afebrile, but then on December 01, 2008 we were  called.  The temperature spiked at one occasion to 100.2.  Chest x-ray  was done  that showed no major problems as far as his chest was  concerned.  His platelets were on the rise as well, we noted.  He had  blood cultures taken and report not back on that.  He had urine specimen  sent as well.  On today's date though, there is no report.  Those were  just done yesterday.  I saw him this morning today on December 02, 2008.  He  is much better.  He just had some blistering about his wound site.  No  signs of any infection.  His calf was soft, a bit large from the  bleeding and so was his thigh, but his thigh was soft.  There was no  compartment syndrome.  On December 02, 2008, his hemoglobin was 8.6,  hematocrit 25.9, his white count 6400.  He was afebrile.  Platelets came  up  now to 101,000.  I thought it was safe to discharge him now since we  have had some consistent hemoglobins in the middle  8.6-8.7 range the  last few times.  So, I elected to watch him and discharge him.   As I noted earlier, we did have a consult and see him.  Dr. Margo Aye saw him  in regards to his platelets and made several suggestions as well.  He  was also seen by Dr. Lendell Caprice in the Medical Service.  Their  consultation reports are in the chart.   On May 24, initial white count was 6400, red cell count 3.94, hemoglobin  11, hematocrit 37.  His platelets were 130,000; that was on admission.  He had subsequent followup studies done.  On May 24, the BUN was 14,  creatinine 1.02, sodium 139, potassium 3.6, chloride 104, carbon dioxide  26.  The SGOT was normal 23, SGPT was 15, alkaline phos 46, total  bilirubin 0.6.  His initial INR was 1.1 preop.  PT was normal at 14.4.  He had a urinalysis that was normal.  He had a chest x-ray was normal  preop.  He had a CT of his chest done on Nov 27, 2008.  This showed some  segmental lower lobe pulmonary artery branches that were not evaluated  due to suboptimal contrast as stated.  He had a small 10-mm nodular  opacity in the right upper lobe, nonspecific, might be  inflammatory.  They said attention should be paid to it in followup.  We will send a  copy to Dr. Joeseph Amor for followup on that.  Another small 2 cm lower  lobe nodule noted in the right lower lobe and has not changed since  December 2008.  The patient needs a followup CT scan. Postop knee x-ray  looked excellent.   FINAL DISCHARGE DIAGNOSES:  1. Thrombocytopenia, temporary etiology deferred.  2. Postop total knee.  3. Anemia secondary to surgery.  4. Myelodysplastic disease, as mentioned above.  5. Postoperative coronary artery bypass graft procedure, 2009.  6. Hypertension.  7. Diabetes mellitus non-insulin dependent.  8. Gout.  9. History of pneumonia in January 2009.  10.Hypercholesterolemia.  11.Anemia he has had in the past and was treated by Dr. Tawanna Sat until      2007.  12.Chronic renal insufficiency.   DISCHARGE MEDICATIONS:  1. Coumadin 2.5 mg a day, and we will monitor that weekly.  2. Remain on his NovoLog insulin.  3. Ferrous sulfate 300 mg b.i.d.  4. Zocor.  5. Cardura.  6. Lisinopril.  7. Procardia.  8. Lopressor.  9. Potassium chloride 20 mEq daily that he has at home.  10.Metformin by mouth 1000 mg by mouth b.i.d.  11.Lovaza 1 gram daily.  12.Lasix 160 mg daily.  13.Robaxin 500 mg t.i.d. p.r.n. for spasm.  14.Percocet 10/350 one every 4 hours p.r.n. for pain.  15.Keflex 500 mg q.i.d. for a week.  We are going to do that because      of the blistering affect about his knee and the blood in the thigh.      I want to make sure he does not get some secondary infection there      which would be a disaster, so we are going to protect him with      that.   Doctors will have him at home by [place].  He will ambulate full  weightbearing with a walker and see me in the office Monday so I  can  watch the blisters on his knee.  They are pressure blisters from the  bleeding inside the joint.           ______________________________  Georges Lynch. Darrelyn Hillock,  M.D.     RAG/MEDQ  D:  12/02/2008  T:  12/02/2008  Job:  [id number]   cc:   Joeseph Amor, MD   [name]. [name], M.D.  Fax: [phone]   Georges Lynch. Darrelyn Hillock, M.D.  Fax: [phone]   Dr. Lendell Caprice

## 2010-11-13 NOTE — Consult Note (Signed)
[name], [name]                 ACCOUNT NO.:  [id number]   MEDICAL RECORD NO.:  [id number]          PATIENT TYPE:  INP   LOCATION:  1441                         FACILITY:  [facility]   PHYSICIAN:  Jethro Bolus, MD            DATE OF BIRTH:  Jun 26, 1934   DATE OF CONSULTATION:  11/30/2008  DATE OF DISCHARGE:                                 CONSULTATION   REASON FOR CONSULTATION:  1. Thrombocytopenia.  2. Abnormal Protime  3. Myelodysplastic syndrome.   HISTORY OF PRESENT ILLNESS:  [name] is a pleasant 77 year old man with  a history of myelodysplastic disease per bone marrow biopsy on September 08, 2008. His International Prognostic Scoring System was low-risk, given  that he has less than 5% blasts and no severe cytopenia, nor did he have  high-risk cytogenetic changes.  Observation was recommended since  starting him on therapy at that time would not change his disease  outcome.  Instead, watchful observation was recommended.   He was admitted to [facility] for right total knee  arthroplasty on Nov 25, 2008, by Dr. Darrelyn Hillock.  Preoperative Hgb was 11  with platelet count of 130K.  His Hgb and plt dropped to 7.3 and 70K  respectively.  He required transfusion of 2 units on Nov 26, 2008 and  today November 30, 2008.  He was started on prophylactic Coumadin for  postoperatively.  In addition, his preoperative PT and INR were normal,  these increased in values over his hospitalization, requiring oral  vitamin K x 2 daily doses as well as having to place on hold his  Coumadin.  We were asked to see him while in the hospital to monitor his  counts with recommendations regarding his hematological care.   He reports that for the past 4 days, his right leg from the knee down  has enlarged with resultant pain.  He has an area of echymosis over the  right knee.  The right knee incision had active bleeding; however, the  bleeding has eased up and now he only has to change bandage once day  without it being soaked through with blood.  He is able to participate  in physical therapy/rehab; however, he has 5/10 pain while weight bear  on the RLE.  He had one episode of fever 4 days ago; however, he has  been afebrile.  He denies HA, confusion, petechiae, visual changes,  cough, hemoptysis, chest pain, abdominal pain, gum bleed, epistaxis,  abdominal pain, melena, hematochezia, constipation, diarrhea.  He had an  episode of chest pain over the weekend with negative work up for acute  coronary syndrome and PE.   PAST MEDICAL HISTORY:  1. Myelodysplastic disease as above.  2. CAD, status post CABG post MI in the year 2009.  3. Hypertension.  4. Diabetes mellitus, noninsulin-dependent.  5. Gout.  6. Severe DJD on admission, status post knee arthroplasty.  7. Remote tobacco, quitting in 1994.  8. BPH.  9. History of pneumonia in January 2009.  10.Hypercholesterolemia.  11.Anemia, prior having received Aranesp at  the office of the [facility] under the care of Dr. Myna Hidalgo until the year 2007.  12.Chronic renal insufficiency.   SURGERIES/PROCEDURES:  1. Status post right knee total arthroplasty Nov 25, 2008.  2. Status post repair of broken left thumb.  3. Status post CABG July 06, 2007, Dr. Laneta Simmers.   ALLERGIES:  No known drug allergies.   MEDICATIONS:  1. Tylenol 650 mg p.r.n.  2. Zyloprim 300 mg daily.  3. Os-Cal 500 mg daily.  4. Colace 100 mg b.i.d.  5. Cardura 4 mg daily.  6. Fergon 324 mg b.i.d.  7. Lasix 160 mg daily.  8. NovoLog as directed.  9. Prinivil 40 mg daily.  10.Glucophage 1 g b.i.d.  11.Lopressor 50 mg b.i.d.  12.Procardia 90 mg daily.  13.Lovaza 1 g daily.  14.Vitamin K 2 mg p.o. x1 today.  15.K-Dur 20 mEq daily.  16.Zocor 40 mg q.h.s.  17.The patient takes the following p.r.n., which is Dulcolax,      Chloraseptic, Maalox, Robaxin, Zofran and Percocet.  18.The following are home medications as well, including omega-3,       aspirin, fish oil.   REVIEW OF SYSTEMS:  negative except for what noted in HPI.   FAMILY HISTORY:  Mother died at 44 with heart disease.  He has 1 brother  who died after exposure to [employer].  Father died of old age.  He  has 1 daughter who has been diagnosed with colon cancer.   SOCIAL HISTORY:  The patient is married.  He has 4 children.  He quit in  1984 the use of 1 pack a day of tobacco for at least 30 years.  No  alcohol history.  Lives in [place].  Nondenominational.  Retired  Theatre stage manager at [employer].  Last colonoscopy in the year 2008 by  Dr. Marina Goodell for polyps, negative for malignancy.   PHYSICAL EXAMINATION:  This is a 77 year old African American male in no  acute distress, alert and oriented x3.  Blood pressure 135/59, pulse 104, respirations 16, temperature 99.3,  pulse oximetry 91 in 2 L.  Weight 100 kg.  Height 73 inches.  HEENT:  Normocephalic, atraumatic.  PERRLA.  Oral cavity without lesions  or thrush.  NECK:  Supple.  No cervical or supraclavicular masses.  LUNGS:  Clear to auscultation bilaterally, with the exception of mild  atelectatic sounds.  CARDIOVASCULAR:  Regular rate and rhythm with a soft II/VI systolic  murmur best heard in the right sternal border.  No rubs or gallops.  ABDOMEN:  Soft, nontender.  Bowel sounds x4.  No hepatosplenomegaly.  EXTREMITIES:  No clubbing or cyanosis.  No edema.  No inguinal masses.  There is swelling in the right knee extending down to the right foot  with echymosis and mild bloody discharge on bandage.  SKIN:  As mentioned above, there are some areas of bruising in the  surgical region, but no petechial rash.  GU, RECTAL:  Deferred.  MUSCULOSKELETAL:  No spinal tenderness.  NEUROLOGIC:  Nonfocal.   LABORATORY DATA:  Hemoglobin 7.8, hematocrit 23.1, white count 7.0,  platelets 83, MCV 86.6, ANC 4.8, taken on June 1, for a white count of  6.7, monocytes 0.3, lymphocytes 0.1, eosinophils 1.5.  PT 35.6, INR  3.2.  Sodium 135, potassium 3.4, BUN 19, creatinine 1.26, glucose 151.  Total  bilirubin 0.9, alkaline phosphatase 36, AST 20, ALT 14, total protein  5.6, albumin 2.4, calcium 9.5.  Urinalysis negative  for protein, small  blood.  Troponin negative.  Hemoccult pending.   I personally reviewed the peripheral blood smear on 11/30/08 which showed  isocytosis without evidence of spherocytosis, schistocytosis,  dacrocytosis, increased central pallor, hyperchromasia, Roulotte  formation.  There were rare platelet clumps and giant platelets.  There  was no peripheral blast.   PROBLEMS:  1. Myelodysplastic syndrome, intermediate risk 1 with mild chronic      anemia with baseline Hgb of 10 and thrombocytopenia with baseline      platelet of 100K.  There is no indication for starting      hypomethylating agent for MDS with his acute issues.  I will follow      his MDS as outpatient and determine when to start therapy.  2. Acute on chronic anemia from most likely right knee hemarthrosis.      There was no evidence of hemolysis with negative Comb's, LDH, and      lack of schistocytosis.  His mildly elevated Tbili is most likely      post transfusional artifact as opposed to active hemolysis.  3. Right knee swelling:  most likely result of hemarthrosis from most      likely recent surgical intervention and immediate post of      anticoagulation.  However, with post ortho procedure, and swollen      LE, will need to rule out for DVT.  Patient received BOTH Vitamin K      and a dose of Coumadin today (Unclear reasoning for this).  Will      need to determine exact etiology prior to treatment.  4. Acute on chronic thrombocytopenia from acute hemarthrosis.  Again,      no evidence of TTP per lab.  However, will need to rule out DIC  5. Coagulopathy with increased PT:  most likely secondary to recent      use of Coumadin which can have prolonged half life in      malnutritioned state.  His albumin has  steady decreased during this      hospital course.  However, will need to rule out DIC and acquired      factor inhibitor.   RECS:  - Transfuse pRBC for Hgb <8.  Start Aranesp SQ qmonth  (indications:  he has MDS and also component of chronic anemia of  chronic kidney disease).  Send for iron study.  If iron deficiency, will  need iron replacement.  - HOLD off on all anticoagulaion including Heparin, Coumadin for now  until definitive underlying problem confirmed.  - RLE Duplex US to confirm hemarthrosis and rule out DVT or if the  reverse is true.  - IF he has ACTIVE hemarthrosis and worsening anemia despite  transfusion, hold off on all form of anticoagualtioin, transfusfe fresh  frozen plasma to reverse anticoagulation with goal INR <1.5 and consider  intraoperative cauterization versus interventional radiology evaluaiton  for embolization of bleeding vessel.  If he has stable hemarthrosis with  stable Hgb, hold off on anticoagulation until INR <2.  Once INR is <2,  resume anticoagulation with heparin protocol for ease of start/stop and  reversibility.  - On the other hand, IF he has DVT, then, will need to resume  anticoagulation with Coumadin.  - Resend PT/PTT.  If worsening despite two doses of Vitamin K, will send  for mixing study.  If mixing study not corrected, will send for factor  inhibitors.  - Send for DIC panel with D-dimer, fibrinogen.  Marlowe Kays, P.A.      Jethro Bolus, MD  Electronically Signed    SW/MEDQ  D:  11/30/2008  T:  11/30/2008  Job:  [id number]

## 2010-11-13 NOTE — Op Note (Signed)
NAMELUKAS, [name]                 ACCOUNT NO.:  [id number]   MEDICAL RECORD NO.:  [id number]          PATIENT TYPE:  INP   LOCATION:  2307                         FACILITY:  [facility]   PHYSICIAN:  [name], M.D.     DATE OF BIRTH:  21-Apr-1934   DATE OF PROCEDURE:  07/06/2007  DATE OF DISCHARGE:                               OPERATIVE REPORT   PREOPERATIVE DIAGNOSIS:  Severe three vessel coronary disease, status  post non-ST segment elevation myocardial infarction.   POSTOPERATIVE DIAGNOSIS:  Severe three vessel coronary disease, status  post non-ST segment elevation myocardial infarction.   OPERATIVE PROCEDURE:  Median sternotomy, extracorporeal circulation,  coronary artery bypass graft surgery x4 using a left internal mammary  artery graft to the left anterior descending coronary artery with a  saphenous vein graft to the diagonal branch to the left anterior  descending artery, a saphenous vein graft to the obtuse marginal branch  of the left circumflex coronary artery, and a saphenous vein graft to  the posterior descending artery of the right coronary artery.  Endoscopic vein harvesting from the right leg.   ATTENDING SURGEON:  [name], M.D.   ASSISTANT:  Rowe Clack, P.A.-C.   ANESTHESIA:  General endotracheal.   CLINICAL HISTORY:  This patient is a 77 year old gentleman with a  history of hypertension and anemia of unknown cause, who was admitted  with unstable angina symptoms and ruled in for a non-ST segment  elevation MI.  A cardiac catheterization showed a severe three vessel  coronary disease with good left ventricular function.  A 2D  echocardiogram suggested mild aortic stenosis with an aortic valve of  2.9 cm2.  He had a CT angiogram of the chest which showed the ascending  aorta to be mildly enlarged, measuring 4.5 cm.  On review of these  studies and examination of the patient, it was felt that coronary artery  bypass graft surgery was the best  treatment to prevent further ischemia  and infarction.  The operative procedure was discussed with the patient,  including alternatives, benefits, and risks, including but not limited  to bleeding, blood transfusion, infection, stroke, myocardial  infarction, graft failure, and death.  He understood and agreed to  proceed.   OPERATIVE PROCEDURE:  The patient was taken to the operating room and  placed on the table in a supine position.  After induction of general  endotracheal anesthesia, a Foley catheter was placed in the bladder  using a sterile technique.  Then the chest, abdomen, and both lower  extremities were prepped and draped in the usual sterile manner.  A  transesophageal echocardiogram was performed by anesthesiology and read  by Dr. Claybon Jabs.  This showed good left ventricular function.  There was no significant aortic stenosis and no mitral regurgitation.   Then the chest was opened through a median sternotomy incision.  The  pericardium was opened in the midline.  Examination of the heart showed  good ventricular contractility.  The ascending aorta was mildly  enlarged.  There was significant atherosclerotic plaque present in the  distal ascending  aorta just before the take-off of the innominate  artery.  Just beyond the take-off of the innominate artery in the  proximal aortic arch, the aorta felt fairly soft without plaque.  The  aortic root had no plaque in it.   Then the left internal mammary artery was harvested from the chest wall  as a pedicle graft.  This was a medium caliber vessel with excellent  blood flow through it.  At the same time, the greater saphenous vein was  harvested from the right leg using endoscopic vein harvest technique.  This vein was a medium size and good quality.  Then the patient was  heparinized, and once adequate anti-clotting time was achieved, the  distal ascending aorta was cannulated using a 22 French aortic cannula  of  arterial inflow.  Venous outflow was achieved using a two stage  venous cannulas to the right atrial appendage.  Antegrade cardioplegia  and cannula was inserted into the aortic root.   Patient was then placed on cardiopulmonary bypass, and the distal  coronary artery was identified.  He had diffuse calcific coronary artery  disease.  The LAD was a large graftable vessel that was heavily diseased  proximally.  The diagonal branch was a medium-sized vessel that was  graftable.  The left circumflex had a large marginal branch that was  graftable.  The right coronary artery was diffusely diseased with  calcific plaque.  This extended into the proximal portion of the  posterior descending artery.  The posterior descending branch itself was  graftable distally.  The posterolateral branch was a moderate-sized,  diffusely diseased vessel.   Then the aorta was cross-clamped, and 1000 cc of cold blood antegrade  cardioplegia was administered in the aortic root with correct arrest of  the heart.  Systemic hypothermia to 23 degrees Centigrade and topical  hypothermic iced saline was used.  A temperature probe was placed in the  septum __________  pericardium.   Then the first distal anastomosis was performed to the obtuse marginal  branch.  The internal diameter was about 2 mm.  The conduit used was a  segment of greater saphenous vein, and the anastomosis was performed in  an end-to-side manner using a continuous 7-0 Prolene suture.  Flow was  noted through the graft and was excellent.   A second distal anastomosis was performed with the diagonal branch.  The  internal diameter was about 7.5 mm.  The conduit used was a second  segment of greater saphenous vein.  The anastomosis was performed in an  end-to-side manner using a continuous 7-0 Prolene suture.  Flow was  noted through the graft and was excellent.  Then another dose of  cardioplegia was given down both vein grafts in the aortic  root.   The third distal anastomosis was performed in the posterior descending  coronary artery.  The internal diameter distally was about 1.75 mm.  The  conduit used was a third segment of greater saphenous vein.  The  anastomosis was performed in an end-to-side manner using a continuous 7-  0 Prolene suture.  Flow was noted through the graft and was excellent.   Then the fourth distal anastomosis was performed to the distal LAD.  The  internal diameter of this vessel was about 2 mm.  The conduit used was  the left internal mammary artery graft, and this was brought through an  opening in the left pericardium anterior to the phrenic nerve.  It was  anastomosed  to the LAD in an end-to-side manner using a continuous 8-0  Prolene suture.  The pedicle was sutured to the epicardium with 6-0  Prolene sutures.  The patient was then rewarmed to 37 cm degree  Centigrade.  With the cross clamp in place, the three proximal main  graft anastomoses were performed to the aortic root in an end-to-side  manner using a continuous 6-0 Prolene suture.  Then the clamp was  removed from the mammary pedicle.  There was rapid rewarming of the  ventricular septum and return of spontaneous ventricular fibrillation.  The cross clamp was removed with a time of 71 minutes, and the patient  spontaneously converted to sinus rhythm.   The proximal and distal anastomoses appeared hemostatic.  All graft  markers were placed around the proximal anastomoses.  Two temporary  right ventricular and right atrial pacing wires were placed and brought  out through the skin.   When the patient had rewarmed to 37 degrees Centigrade, he was weaned  from cardiopulmonary bypass on no anatrophic agents.  Total bypass time  was 95 minutes.  Cardiac function appeared excellent with a cardiac  output of 5 liters per minute.  Protamine was given, and the venous and  the aortic cannulae were removed without difficulty.  Hemostasis  was  achieved.  The patient did have some coagulopathy and was given 2 units  of platelets.  Then three chest tubes were placed with two in the  posterior pericardium in the left pleural space and one to the anterior  mediastinum.  The pericardium was reapproximated with double #6  stainless steel wires.  The fascia was closed with continuous #1 Vicryl  suture.  The subcutaneous tissue was closed with a continuous 2-0 Vicryl  and the skin with a 3-0 Vicryl subcuticular closure.  The lower  extremity vein harvest site was closed in layers in a similar manner.  The sponge, needle, and instrument counts were correct according to the  scrub nurse.  Dry sterile dressings were applied over the incisions  around the chest tubes, which were cleared by suction.  The patient  remained hemodynamically stable and was transported to the SICU in  guarded but stable condition.      [name], M.D.  Electronically Signed     BB/MEDQ  D:  07/06/2007  T:  07/06/2007  Job:  [id number]   cc:   [name]. [name], M.D.

## 2010-11-13 NOTE — Assessment & Plan Note (Signed)
OFFICE VISIT   [name], [name]  DOB:  03/06/1934                                        July 28, 2007  CHART #:  [id number]   The patient returned today for a follow-up status post coronary artery  bypass graft surgery x4 on July 06, 2007.  He has been feeling well  overall and is walking without chest pain or shortness of breath.  He  does use a cane.  He has a follow-up appointment later this afternoon  with [name]. [name], M.D.   PHYSICAL EXAMINATION:  VITAL SIGNS:  Blood pressure 144/78, pulse 95 and  regular, respiratory rate 18 and unlabored.  Oxygen saturation on room  air is 98%.  GENERAL:  He looks well.  HEART:  Regular rate and rhythm with normal heart sounds.  LUNGS:  Clear.  CHEST:  Incision is healing well and sternum is stable.  EXTREMITIES:  The right leg incision is healing well.  There is small  amount of eschar present, but no drainage or sign of infection.  There  is no significant lower extremity edema.   Follow-up chest x-ray today shows mild pulmonary vascular congestion.  Postoperative pleural effusions have essentially resolved.   MEDICATIONS:  1. Lopressor 25 mg b.i.d.  2. Doxazosin 4 mg daily.  3. Metformin 1000 mg b.i.d.  4. Zocor 40 mg daily.  5. Lisinopril 40 mg daily.  6. Nifedipine 90 mg daily.  7. Allopurinol 300 mg daily.  8. Ferrous sulfate b.i.d.  9. Enteric-coated aspirin 325 mg daily.  10.KCL four tablets per day.   IMPRESSION:  Overall, the patient is recovering well following his  surgery.  I encouraged him to continue walking as much as possible.  I  told him he could return to driving a car, but should refrain from  lifting anything heavier than 10 pounds for a total of three months from  the date of surgery.  I wrote him another pain medicine prescription for  Oxycodone IR 5 mg q.6 hours p.r.n. for pain #30 since he is still having  some discomfort late in the afternoon and using pain medicine  for that.  He will follow up with Dr. [name] later today as well as his primary  physician, C. Duane Lope, M.D., and will contact me if he develops any  problems with his incisions.   [name], M.D.  Electronically Signed   BB/MEDQ  D:  07/28/2007  T:  07/28/2007  Job:  [id number]   cc:   [name]. [name], M.D.  Vianne Bulls, M.D.

## 2010-11-13 NOTE — Consult Note (Signed)
[name], [name] NO.:  [id number]   MEDICAL RECORD NO.:  [id number]          PATIENT TYPE:  INP   LOCATION:  2914                         FACILITY:  [facility]   PHYSICIAN:  Sheliah Plane, MD    DATE OF BIRTH:  [birth year]   DATE OF CONSULTATION:  06/29/2007  DATE OF DISCHARGE:                                 CONSULTATION   REQUESTING PHYSICIAN:  Dr. [name].   CARDIOLOGIST:  Dr. [name].   PRIMARY CARE PHYSICIAN:  Dr. Tenny Craw.   REASON FOR CONSULTATION:  Coronary occlusive disease with acute  myocardial infarction.   HISTORY OF PRESENT ILLNESS:  The patient is a 77 year old male who did  not previously note that he had had any coronary artery history who had  had intermittent substernal chest pain for at least several months but  usually was very brief. He noted on Sunday morning December 28 he was  awakened about 3 in the morning with midsternal chest pain, lasting at  least 5 to 7 minutes. He sat up, and it improved. He went back to sleep.  Several hours later, it recurred and was more severe and lasted longer.  He came to the emergency room early Sunday morning. Troponins were  performed and elevated from 0.02 to 0.11. The patient was admitted,  started on heparin. Had one recurrent episode of chest pain in the  hospital and was started on Integrilin. He underwent cardiac  catheterization today and was found to have three-vessel coronary artery  disease. It was difficult to evaluate his LV function and if he had any  root dilatation or dilated ascending aorta.   The patient has had no previous myocardial infarction. No history of  angioplasty. He does have significant history of hypertension since age  53, history of diabetes 3 to 4 years, history of hyperlipidemia. He is a  remote smoker but quit in 1982.   FAMILY HISTORY:  Significant for his father died at age 39 of unknown  causes. Mother died of myocardial infarction at age 34. He had one  brother  who died in his 20s after 2 tours of duty in [place] and  question of agent orange exposure.   The patient denies any previous stroke. He does have claudication which  is intermittent, usually when walking up hills. Both legs appear to be  equally affected.   PAST MEDICAL HISTORY:  No history of GI bleed. History of colonoscopy  with polyp removed by Dr. Marina Goodell 2 years ago. Has no previous surgical  history.   SOCIAL HISTORY:  The patient is married. He is a retired Art gallery manager at [employer]. Lives with his wife of 103 years whose birthday is  today. She is 75.   MEDICATIONS:  1. Lasix 80 mg b.i.d.  2. Zocor 40 mg a day.  3. Atenolol 100 mg a day.  4. Lisinopril 40 mg a day.  5. Metformin 1000 b.i.d.  6. K-Dur 20 b.i.d.  7. Cardura 4 mg a day.  8. Allopurinol 300 a day.   DRUG ALLERGIES:  None known.   CARDIAC  REVIEW OF SYSTEMS:  Is positive for chest pain. He denies  resting shortness of breath, exertional shortness of breath, orthopnea,  presyncope, syncope, palpitations or lower extremity edema.   GENERAL REVIEW OF SYSTEMS:  He denies any fevers, chills, or night  sweats or other constitutional symptoms. RESPIRATORY:  Denies  hemoptysis. GASTROINTESTINAL:  Has had a history of intestinal polyps  removed by colonoscopy 2 years ago by Dr. Marina Goodell. The patient denies any  obvious blood in stool. He notes he has had dark stools in the past but  only when he eats greens, none lately. NEUROLOGICAL:  Denies amaurosis  or TIAs. MUSCULOSKELETAL:  Some hip and knee discomfort. GENITOURINARY:  The patient is on Cardura. Denies urinary frequency or hematuria. Denies  any recent infections. History of diabetes as noted above. Denies  psychiatric history. Other review of systems are negative.   Blood pressure is 113/62, pulse 87, O2 saturation 96%, respiratory rate  20. The patient is 5 feet 11 inches tall, 228 pounds. He is awake,  alert, neurological intake.  Pupils are  equal, round, and reactive to light.  NECK:  Is without carotid bruits.  LUNGS:  Are clear bilaterally.  CARDIAC EXAM:  Reveals regular rate and rhythm. I do not appreciate any  murmur of aortic insufficiency.  ABDOMINAL EXAM:  Moderately obese. Abdomen without palpable masses or  tenderness. He has dressing on the right groin but without significant  hematoma from his calf.  LOWER EXTREMITIES:  Revealed +1 DP and PT pulses bilaterally. Appears to  have adequate vein both lower extremities. No pedal edema.   LABORATORY FINDINGS:  Since admission, his hematocrit has gone from 30  to 27.4, hemoglobin 10.2 to 8.6, platelet count 121 to 116. Sodium 138,  creatinine 1, glucose 125. Troponin was 0.2 to 0.11. CK-MB was 40, MB of  1. Cardiac catheterization films are reviewed and discussed with Dr.  [name]. The left main is patent. Coronaries are severely calcified. He  has tortuous iliacs. Circumflex has 2 branches with sequential 90%  lesions. LAD has a 90% lesion of the mid portion. Large right with a  mild lesion of the PD and mid right lesion. Difficulty with LV gram  without full filling and the ventricle is hard to assess the size of the  aortic root or the true LV function, and patient will need a CT scan of  the chest to evaluate the aorta and echocardiogram to examine the aortic  valve and aortic root and also to determine LV function.   IMPRESSION:  The patient with unstable angina symptoms, positive  troponins for acute myocardial infarction with significant three-vessel  coronary artery disease, question left ventricular function. Agree with  consideration for coronary artery bypass grafting with the degree of the  patient's symptoms and degree of stenosis. Evaluate his LV  function, size of his aorta and consider proceeding with bypass surgery  later this week on this admission. Surgery is discussed with the patient  including risks and options. Risk of death, infection,  stroke,  myocardial infarction, bleeding, blood transfusion also discussed in  detail.      Sheliah Plane, MD  Electronically Signed     EG/MEDQ  D:  06/29/2007  T:  06/30/2007  Job:  [id number]   cc:   [name]. [name], M.D.  Freda Jackson, M.D.

## 2010-11-14 ENCOUNTER — Other Ambulatory Visit: Payer: Self-pay | Admitting: Oncology

## 2010-11-14 ENCOUNTER — Encounter (HOSPITAL_BASED_OUTPATIENT_CLINIC_OR_DEPARTMENT_OTHER): Payer: Medicare Other | Admitting: Oncology

## 2010-11-14 DIAGNOSIS — D469 Myelodysplastic syndrome, unspecified: Secondary | ICD-10-CM

## 2010-11-14 DIAGNOSIS — M1990 Unspecified osteoarthritis, unspecified site: Secondary | ICD-10-CM

## 2010-11-14 DIAGNOSIS — I10 Essential (primary) hypertension: Secondary | ICD-10-CM

## 2010-11-14 LAB — CBC WITH DIFFERENTIAL/PLATELET
BASO%: 0.1 % (ref 0.0–2.0)
EOS%: 68.9 % — ABNORMAL HIGH (ref 0.0–7.0)
HCT: 33.8 % — ABNORMAL LOW (ref 38.4–49.9)
MCH: 26.7 pg — ABNORMAL LOW (ref 27.2–33.4)
MCHC: 32.8 g/dL (ref 32.0–36.0)
MONO#: 0.2 10*3/uL (ref 0.1–0.9)
RBC: 4.16 10*6/uL — ABNORMAL LOW (ref 4.20–5.82)
RDW: 18.6 % — ABNORMAL HIGH (ref 11.0–14.6)
WBC: 8.8 10*3/uL (ref 4.0–10.3)
lymph#: 1 10*3/uL (ref 0.9–3.3)
nRBC: 0 % (ref 0–0)

## 2010-12-28 ENCOUNTER — Encounter: Payer: Self-pay | Admitting: *Deleted

## 2011-01-01 ENCOUNTER — Encounter: Payer: Self-pay | Admitting: Cardiology

## 2011-01-04 ENCOUNTER — Other Ambulatory Visit: Payer: Self-pay | Admitting: *Deleted

## 2011-01-04 DIAGNOSIS — E785 Hyperlipidemia, unspecified: Secondary | ICD-10-CM

## 2011-01-08 ENCOUNTER — Encounter: Payer: Self-pay | Admitting: Nurse Practitioner

## 2011-01-08 ENCOUNTER — Telehealth: Payer: Self-pay | Admitting: Cardiology

## 2011-01-08 ENCOUNTER — Other Ambulatory Visit (INDEPENDENT_AMBULATORY_CARE_PROVIDER_SITE_OTHER): Payer: Medicare Other | Admitting: *Deleted

## 2011-01-08 ENCOUNTER — Ambulatory Visit (INDEPENDENT_AMBULATORY_CARE_PROVIDER_SITE_OTHER): Payer: Medicare Other | Admitting: Nurse Practitioner

## 2011-01-08 DIAGNOSIS — I10 Essential (primary) hypertension: Secondary | ICD-10-CM

## 2011-01-08 DIAGNOSIS — I2510 Atherosclerotic heart disease of native coronary artery without angina pectoris: Secondary | ICD-10-CM

## 2011-01-08 DIAGNOSIS — E785 Hyperlipidemia, unspecified: Secondary | ICD-10-CM

## 2011-01-08 DIAGNOSIS — I350 Nonrheumatic aortic (valve) stenosis: Secondary | ICD-10-CM | POA: Insufficient documentation

## 2011-01-08 DIAGNOSIS — I359 Nonrheumatic aortic valve disorder, unspecified: Secondary | ICD-10-CM

## 2011-01-08 LAB — BASIC METABOLIC PANEL
BUN: 28 mg/dL — ABNORMAL HIGH (ref 6–23)
CO2: 28 mEq/L (ref 19–32)
Calcium: 10 mg/dL (ref 8.4–10.5)
Chloride: 110 mEq/L (ref 96–112)
Creatinine, Ser: 1.4 mg/dL (ref 0.4–1.5)
GFR: 64.11 mL/min (ref >60.00)
Glucose, Bld: 118 mg/dL — ABNORMAL HIGH (ref 70–99)
Potassium: 4.4 mEq/L (ref 3.5–5.1)
Sodium: 143 mEq/L (ref 135–145)

## 2011-01-08 LAB — LIPID PANEL
Cholesterol: 81 mg/dL (ref 0–200)
HDL: 31.5 mg/dL — ABNORMAL LOW (ref >39.00)
LDL Cholesterol: 31 mg/dL (ref 0–99)
Total CHOL/HDL Ratio: 3
Triglycerides: 92 mg/dL (ref 0.0–149.0)
VLDL: 18.4 mg/dL (ref 0.0–40.0)

## 2011-01-08 LAB — HEPATIC FUNCTION PANEL
ALT: 16 U/L (ref 0–53)
AST: 22 U/L (ref 0–37)
Albumin: 4.5 g/dL (ref 3.5–5.2)
Alkaline Phosphatase: 48 U/L (ref 39–117)
Bilirubin, Direct: 0.1 mg/dL (ref 0.0–0.3)
Total Bilirubin: 0.4 mg/dL (ref 0.3–1.2)
Total Protein: 8.1 g/dL (ref 6.0–8.3)

## 2011-01-08 NOTE — Assessment & Plan Note (Signed)
Blood pressure is ok. No change in medicines.

## 2011-01-08 NOTE — Assessment & Plan Note (Signed)
Last echo was in 2011. Will consider repeating next year.

## 2011-01-08 NOTE — Patient Instructions (Signed)
Stay on all your current medicines. Monitor your blood pressure on occasion Continue to watch your salt We will see you back in 6 months with Dr. [name]

## 2011-01-08 NOTE — Assessment & Plan Note (Signed)
Doing well with no complaints. Will keep on his current regimen. Will see him back in 6 months. Patient is agreeable to this plan and will call if any problems develop in the interim.

## 2011-01-08 NOTE — Progress Notes (Signed)
[name] Date of Birth: 05-29-1934   History of Present Illness: [name] is seen back today for a 6 month visit. He is seen for Dr. Elease Stone. He is a former patient of Dr. Ronnald Stone. He continues to do well. No chest pain or shortness of breath. He has mild AS. Last echo was in July of 2011. AVA is 2.0. He has normal LV systolic function. He tries to stay active. He is tolerating his medicines. He never tried the Viagra. He says his wife is too scared of it. He can accept that he says. No other issues except some mild swelling. He does not use support stockings.   Current Outpatient Prescriptions on File Prior to Visit  Medication Sig Dispense Refill  . allopurinol (ZYLOPRIM) 300 MG tablet Take 300 mg by mouth daily.        Marland Kitchen aspirin 325 MG tablet Take 325 mg by mouth daily.        Marland Kitchen CALCIUM PO Take by mouth.        . doxazosin (CARDURA) 4 MG tablet Take 4 mg by mouth at bedtime.        . ferrous gluconate (FERGON) 324 MG tablet Take 324 mg by mouth 2 (two) times daily.        . furosemide (LASIX) 80 MG tablet Take 80 mg by mouth 2 (two) times daily.        Marland Kitchen GLUCOSAMINE PO Take 1 capsule by mouth daily.        Marland Kitchen losartan (COZAAR) 100 MG tablet Take 100 mg by mouth daily.        . metFORMIN (GLUMETZA) 1000 MG (MOD) 24 hr tablet Take 1,000 mg by mouth daily with breakfast.        . metoprolol (LOPRESSOR) 50 MG tablet Take 50 mg by mouth 2 (two) times daily.        . Multiple Vitamins-Minerals (MULTIVITAMIN WITH MINERALS) tablet Take 100 tablets by mouth daily.        Marland Kitchen NIFEdipine (ADALAT CC) 90 MG 24 hr tablet Take 90 mg by mouth daily.        . Omega-3 Fatty Acids (FISH OIL PO) Take 1 capsule by mouth daily.        . potassium chloride SA (K-DUR,KLOR-CON) 20 MEQ tablet Take 20 mEq by mouth 2 (two) times daily.        . simvastatin (ZOCOR) 40 MG tablet Take 40 mg by mouth at bedtime.        . sildenafil (VIAGRA) 100 MG tablet Take 100 mg by mouth daily as needed.        Marland Kitchen DISCONTD:  lisinopril (PRINIVIL,ZESTRIL) 40 MG tablet Take 40 mg by mouth daily.          No Known Allergies  Past Medical History  Diagnosis Date  . Diabetes mellitus   . Hypertension     since his Mid -- 20's  . Hyperlipidemia   . Coronary artery disease     CABG x4  -- with Est. EF of 50-60%  --  07/06/2007  . Mild aortic stenosis   . Anemia, chronic disease     Followed by Dr. [name]  . Thrombocytopenia   . Renal insufficiency   . Gout     Past Surgical History  Procedure Date  . Cardiac catheterization 06/29/2007     Left heart catheterization with selective coronary angiography, left ventricular angiography -- Probable normal left ventricular function and dilated aortic root --  Extensive and heavy calcification of coronary arteries --  [name]. [name], M.D.       . Total knee arthroplasty 11/25/2008    Right knee - Severe degenerative arthritis with a varus deformity of the right knee --  [name], M.D.   . Coronary artery bypass graft 07/06/2007     x4 using a left internal mammary  artery graft to the left anterior descending coronary artery with a saphenous vein graft to the diagonal branch to the left anterior descending artery, a saphenous vein graft to the obtuse marginal branch of the left circumflex coronary artery, and a saphenous vein graft to  the posterior descending artery of the right coronary artery -  [name], M.D    History  Smoking status  . Former Smoker  . Quit date: 07/01/1980  Smokeless tobacco  . Not on file    History  Alcohol Use No    History reviewed. No pertinent family history.  Review of Systems: The review of systems is positive for mild edema. No chest pain. No shortness of breath. He is not dizzy or having syncope.  All other systems were reviewed and are negative.  Physical Exam: BP 110/62  Pulse 64  Ht 6\' 1"  (1.854 m)  Wt 225 lb (102.059 kg)  BMI 29.69 kg/m2 Patient is very pleasant and in no acute distress. He is  overweight. Skin is warm and dry. Color is normal.  HEENT is unremarkable. Normocephalic/atraumatic. PERRL. Sclera are nonicteric. Neck is supple. No masses. No JVD. Lungs are clear. Cardiac exam shows a regular rate and rhythm. Abdomen is soft. Extremities are with trace edema. Gait and ROM are intact. No gross neurologic deficits noted.  LABORATORY DATA: Pending  Assessment / Plan:

## 2011-01-08 NOTE — Telephone Encounter (Signed)
Message copied by Karle Plumber on Tue Jan 08, 2011  3:32 PM ------      Message from: Rosalio Macadamia      Created: Tue Jan 08, 2011  3:23 PM       Ok to report. Labs are satisfactory.  Stay on same medicines. Recheck BMET/HPF/LIPIDS  in 6 months.

## 2011-01-08 NOTE — Telephone Encounter (Signed)
Results reported. 

## 2011-01-16 ENCOUNTER — Encounter (HOSPITAL_BASED_OUTPATIENT_CLINIC_OR_DEPARTMENT_OTHER): Payer: Medicare Other | Admitting: Oncology

## 2011-01-16 ENCOUNTER — Other Ambulatory Visit: Payer: Self-pay | Admitting: Oncology

## 2011-01-16 LAB — CBC WITH DIFFERENTIAL/PLATELET
BASO%: 0.1 % (ref 0.0–2.0)
LYMPH%: 9.8 % — ABNORMAL LOW (ref 14.0–49.0)
MCHC: 32.9 g/dL (ref 32.0–36.0)
MONO#: 0.2 10*3/uL (ref 0.1–0.9)
MONO%: 2.1 % (ref 0.0–14.0)
NEUT#: 2.1 10*3/uL (ref 1.5–6.5)
Platelets: 80 10*3/uL — ABNORMAL LOW (ref 140–400)
RBC: 4.19 10*6/uL — ABNORMAL LOW (ref 4.20–5.82)
RDW: 19.5 % — ABNORMAL HIGH (ref 11.0–14.6)
WBC: 10.3 10*3/uL (ref 4.0–10.3)
nRBC: 0 % (ref 0–0)

## 2011-03-21 LAB — BASIC METABOLIC PANEL
BUN: 18
BUN: 21
CO2: 26
CO2: 27
Calcium: 8.3 — ABNORMAL LOW
Calcium: 8.4
Calcium: 8.5
Calcium: 8.5
Calcium: 8.6
Chloride: 107
Chloride: 112
Chloride: 113 — ABNORMAL HIGH
Creatinine, Ser: 1.29
Creatinine, Ser: 1.36
Creatinine, Ser: 1.44
Creatinine, Ser: 1.5
GFR calc Af Amer: 57 — ABNORMAL LOW
GFR calc Af Amer: 59 — ABNORMAL LOW
GFR calc Af Amer: >60
GFR calc Af Amer: >60
GFR calc non Af Amer: 46 — ABNORMAL LOW
GFR calc non Af Amer: 47 — ABNORMAL LOW
GFR calc non Af Amer: 48 — ABNORMAL LOW
GFR calc non Af Amer: 51 — ABNORMAL LOW
Glucose, Bld: 123 — ABNORMAL HIGH
Glucose, Bld: 127 — ABNORMAL HIGH
Potassium: 4.2
Sodium: 138
Sodium: 140
Sodium: 140
Sodium: 141
Sodium: 144

## 2011-03-21 LAB — CROSSMATCH
ABO/RH(D): A POS
ABO/RH(D): A POS
ABO/RH(D): A POS
Antibody Screen: NEGATIVE
Antibody Screen: NEGATIVE

## 2011-03-21 LAB — POCT I-STAT 4, (NA,K, GLUC, HGB,HCT)
Glucose, Bld: 114 — ABNORMAL HIGH
Glucose, Bld: 124 — ABNORMAL HIGH
Glucose, Bld: 129 — ABNORMAL HIGH
Glucose, Bld: 130 — ABNORMAL HIGH
Glucose, Bld: 132 — ABNORMAL HIGH
Glucose, Bld: 132 — ABNORMAL HIGH
Glucose, Bld: 95
HCT: 23 — ABNORMAL LOW
HCT: 25 — ABNORMAL LOW
HCT: 29 — ABNORMAL LOW
Hemoglobin: 7.8 — CL
Hemoglobin: 8.5 — ABNORMAL LOW
Operator id: 157281
Operator id: 3291
Operator id: 3291
Operator id: 3291
Operator id: 3291
Operator id: 3291
Operator id: 3291
Potassium: 3.7
Potassium: 3.9
Potassium: 4.3
Potassium: 4.6
Potassium: 4.8
Sodium: 136
Sodium: 138
Sodium: 138
Sodium: 140
Sodium: 142

## 2011-03-21 LAB — POCT I-STAT 3, ART BLOOD GAS (G3+)
Acid-base deficit: 4 — ABNORMAL HIGH
Bicarbonate: 24.6 — ABNORMAL HIGH
O2 Saturation: 100
O2 Saturation: 94
O2 Saturation: 99
Operator id: 273681
Patient temperature: 37.1
Patient temperature: 37.4
Patient temperature: 38.3
TCO2: 24
TCO2: 26
TCO2: 27
pCO2 arterial: 30.9 — ABNORMAL LOW
pCO2 arterial: 35
pH, Arterial: 7.338 — ABNORMAL LOW
pH, Arterial: 7.427
pO2, Arterial: 161 — ABNORMAL HIGH

## 2011-03-21 LAB — I-STAT EC8
BUN: 22
Chloride: 110
HCT: 21 — ABNORMAL LOW
pCO2 arterial: 35.5
pH, Arterial: 7.396

## 2011-03-21 LAB — BLOOD GAS, ARTERIAL
Acid-Base Excess: 0.8
Bicarbonate: 24.3 — ABNORMAL HIGH
O2 Content: 2
O2 Saturation: 90.8
Patient temperature: 98.6
TCO2: 25.3
pCO2 arterial: 34.4 — ABNORMAL LOW
pH, Arterial: 7.462 — ABNORMAL HIGH
pO2, Arterial: 59.3 — ABNORMAL LOW

## 2011-03-21 LAB — CBC
HCT: 21.3 — ABNORMAL LOW
HCT: 22.3 — ABNORMAL LOW
HCT: 30.4 — ABNORMAL LOW
HCT: 31.6 — ABNORMAL LOW
Hemoglobin: 10.2 — ABNORMAL LOW
Hemoglobin: 7.2 — CL
Hemoglobin: 7.7 — CL
Hemoglobin: 7.9 — CL
Hemoglobin: 9.5 — ABNORMAL LOW
Hemoglobin: 9.5 — ABNORMAL LOW
Hemoglobin: 9.6 — ABNORMAL LOW
MCHC: 33.1
MCHC: 33.2
MCHC: 33.2
MCHC: 33.3
MCHC: 33.4
MCHC: 33.5
MCHC: 33.5
MCHC: 33.6
MCHC: 33.6
MCHC: 34
MCV: 84.7
MCV: 84.7
MCV: 84.8
MCV: 85.4
MCV: 85.6
MCV: 85.8
MCV: 86
MCV: 86.6
MCV: 86.7
Platelets: 107 — ABNORMAL LOW
Platelets: 111 — ABNORMAL LOW
Platelets: 118 — ABNORMAL LOW
Platelets: 119 — ABNORMAL LOW
Platelets: 121 — ABNORMAL LOW
Platelets: 156
Platelets: 90 — ABNORMAL LOW
RBC: 2.46 — ABNORMAL LOW
RBC: 2.49 — ABNORMAL LOW
RBC: 2.52 — ABNORMAL LOW
RBC: 2.63 — ABNORMAL LOW
RBC: 2.67 — ABNORMAL LOW
RBC: 2.76 — ABNORMAL LOW
RBC: 3.34 — ABNORMAL LOW
RBC: 3.35 — ABNORMAL LOW
RBC: 3.46 — ABNORMAL LOW
RBC: 3.54 — ABNORMAL LOW
RDW: 16.2 — ABNORMAL HIGH
RDW: 17 — ABNORMAL HIGH
RDW: 17.1 — ABNORMAL HIGH
RDW: 17.1 — ABNORMAL HIGH
RDW: 17.3 — ABNORMAL HIGH
RDW: 18 — ABNORMAL HIGH
WBC: 4.5
WBC: 4.8
WBC: 5.2
WBC: 5.2
WBC: 6
WBC: 6.4

## 2011-03-21 LAB — DIFFERENTIAL
Band Neutrophils: 6
Basophils Relative: 0
Blasts: 0
Eosinophils Relative: 2
Lymphocytes Relative: 12
Metamyelocytes Relative: 2
Monocytes Relative: 3
Myelocytes: 0
Neutrophils Relative %: 75
Promyelocytes Absolute: 0
nRBC: 0

## 2011-03-21 LAB — COMPREHENSIVE METABOLIC PANEL
ALT: 12
AST: 18
Albumin: 3.1 — ABNORMAL LOW
Alkaline Phosphatase: 37 — ABNORMAL LOW
BUN: 19
CO2: 26
Calcium: 8.9
Chloride: 109
Creatinine, Ser: 1.59 — ABNORMAL HIGH
GFR calc Af Amer: 52 — ABNORMAL LOW
GFR calc non Af Amer: 43 — ABNORMAL LOW
Glucose, Bld: 145 — ABNORMAL HIGH
Potassium: 3.8
Sodium: 142
Total Bilirubin: 1
Total Protein: 6.5

## 2011-03-21 LAB — PREPARE PLATELET PHERESIS

## 2011-03-21 LAB — LIPID PANEL
Cholesterol: 83
HDL: 26 — ABNORMAL LOW
LDL Cholesterol: 41
Total CHOL/HDL Ratio: 3.2
Triglycerides: 78
VLDL: 16

## 2011-03-21 LAB — RETICULOCYTES
RBC.: 2.59 — ABNORMAL LOW
Retic Count, Absolute: 23.3
Retic Ct Pct: 0.9

## 2011-03-21 LAB — POCT I-STAT 3, VENOUS BLOOD GAS (G3P V)
Acid-base deficit: 3 — ABNORMAL HIGH
Operator id: 3291
pH, Ven: 7.272

## 2011-03-21 LAB — URINE MICROSCOPIC-ADD ON

## 2011-03-21 LAB — PROTIME-INR
Prothrombin Time: 15.2
Prothrombin Time: 18.3 — ABNORMAL HIGH

## 2011-03-21 LAB — URINALYSIS, ROUTINE W REFLEX MICROSCOPIC
Glucose, UA: NEGATIVE
Specific Gravity, Urine: 1.013
Urobilinogen, UA: 1

## 2011-03-21 LAB — OCCULT BLOOD X 1 CARD TO LAB, STOOL: Fecal Occult Bld: NEGATIVE

## 2011-03-21 LAB — CLOSTRIDIUM DIFFICILE EIA: C difficile Toxins A+B, EIA: NEGATIVE

## 2011-03-21 LAB — HEMOGLOBIN A1C
Hgb A1c MFr Bld: 5.6
Mean Plasma Glucose: 122

## 2011-03-21 LAB — POCT I-STAT GLUCOSE
Glucose, Bld: 132 — ABNORMAL HIGH
Operator id: 3291

## 2011-03-21 LAB — CREATININE, SERUM: GFR calc Af Amer: >60

## 2011-04-03 ENCOUNTER — Other Ambulatory Visit: Payer: Self-pay | Admitting: Oncology

## 2011-04-03 ENCOUNTER — Encounter (HOSPITAL_BASED_OUTPATIENT_CLINIC_OR_DEPARTMENT_OTHER): Payer: Medicare Other | Admitting: Oncology

## 2011-04-03 DIAGNOSIS — I10 Essential (primary) hypertension: Secondary | ICD-10-CM

## 2011-04-03 DIAGNOSIS — D696 Thrombocytopenia, unspecified: Secondary | ICD-10-CM

## 2011-04-03 DIAGNOSIS — M1990 Unspecified osteoarthritis, unspecified site: Secondary | ICD-10-CM

## 2011-04-03 DIAGNOSIS — D638 Anemia in other chronic diseases classified elsewhere: Secondary | ICD-10-CM

## 2011-04-03 DIAGNOSIS — D4620 Refractory anemia with excess of blasts, unspecified: Secondary | ICD-10-CM

## 2011-04-03 DIAGNOSIS — IMO0002 Reserved for concepts with insufficient information to code with codable children: Secondary | ICD-10-CM

## 2011-04-03 DIAGNOSIS — D469 Myelodysplastic syndrome, unspecified: Secondary | ICD-10-CM

## 2011-04-03 LAB — CBC WITH DIFFERENTIAL/PLATELET
HCT: 34.8 % — ABNORMAL LOW (ref 38.4–49.9)
HGB: 11.4 g/dL — ABNORMAL LOW (ref 13.0–17.1)
MCH: 27.2 pg (ref 27.2–33.4)
MCHC: 32.7 g/dL (ref 32.0–36.0)
MCV: 83.1 fL (ref 79.3–98.0)
Platelets: 88 10*3/uL — ABNORMAL LOW (ref 140–400)
RBC: 4.19 10*6/uL — ABNORMAL LOW (ref 4.20–5.82)
RDW: 22.8 % — ABNORMAL HIGH (ref 11.0–14.6)
WBC: 10.6 10*3/uL — ABNORMAL HIGH (ref 4.0–10.3)

## 2011-04-03 LAB — MANUAL DIFFERENTIAL
ALC: 0.8 10*3/uL — ABNORMAL LOW (ref 0.9–3.3)
ANC (CHCC manual diff): 1.3 10*3/uL — ABNORMAL LOW (ref 1.5–6.5)
Band Neutrophils: 0 % (ref 0–10)
Basophil: 0 % (ref 0–2)
Blasts: 0 % (ref 0–0)
EOS: 79 % — ABNORMAL HIGH (ref 0–7)
LYMPH: 8 % — ABNORMAL LOW (ref 14–49)
MONO: 1 % (ref 0–14)
Metamyelocytes: 0 % (ref 0–0)
Myelocytes: 0 % (ref 0–0)
Other Cell: 0 % (ref 0–0)
PLT EST: DECREASED
PROMYELO: 0 % (ref 0–0)
SEG: 12 % — ABNORMAL LOW (ref 38–77)
Variant Lymph: 0 % (ref 0–0)
nRBC: 0 % (ref 0–0)

## 2011-04-03 LAB — COMPREHENSIVE METABOLIC PANEL
ALT: 10 U/L (ref 0–53)
AST: 19 U/L (ref 0–37)
Albumin: 4.5 g/dL (ref 3.5–5.2)
Alkaline Phosphatase: 51 U/L (ref 39–117)
Potassium: 4.5 mEq/L (ref 3.5–5.3)
Sodium: 142 mEq/L (ref 135–145)
Total Protein: 7.5 g/dL (ref 6.0–8.3)

## 2011-04-03 LAB — CHCC SMEAR

## 2011-04-05 LAB — BASIC METABOLIC PANEL
BUN: 24 — ABNORMAL HIGH
BUN: 13
BUN: 13
BUN: 17
Calcium: 9.8
Calcium: 8.4
Calcium: 8.9
Chloride: 106
Chloride: 109
Creatinine, Ser: 1.07
Creatinine, Ser: 1.15
GFR calc Af Amer: >60
GFR calc non Af Amer: >60
GFR calc non Af Amer: >60
GFR calc non Af Amer: >60
GFR calc non Af Amer: >60
Glucose, Bld: 127 — ABNORMAL HIGH
Glucose, Bld: 120 — ABNORMAL HIGH
Glucose, Bld: 125 — ABNORMAL HIGH
Glucose, Bld: 135 — ABNORMAL HIGH
Potassium: 4.1
Potassium: 3.6
Potassium: 3.7
Potassium: 3.8
Potassium: 3.9
Sodium: 140

## 2011-04-05 LAB — VITAMIN B12: Vitamin B-12: 619 (ref 211–911)

## 2011-04-05 LAB — CROSSMATCH
ABO/RH(D): A POS
Antibody Screen: NEGATIVE

## 2011-04-05 LAB — HEPARIN LEVEL (UNFRACTIONATED)
Heparin Unfractionated: <0.1 — ABNORMAL LOW
Heparin Unfractionated: <0.1 — ABNORMAL LOW

## 2011-04-05 LAB — URINE CULTURE
Colony Count: 15000
Special Requests: NEGATIVE

## 2011-04-05 LAB — CARDIAC PANEL(CRET KIN+CKTOT+MB+TROPI)
CK, MB: 1
CK, MB: 1
Relative Index: INVALID
Total CK: 42
Troponin I: 0.02

## 2011-04-05 LAB — PLATELET COUNT: Platelets: 109 — ABNORMAL LOW

## 2011-04-05 LAB — CBC
HCT: 30.7 — ABNORMAL LOW
HCT: 25.4 — ABNORMAL LOW
HCT: 25.5 — ABNORMAL LOW
HCT: 26 — ABNORMAL LOW
HCT: 27.4 — ABNORMAL LOW
HCT: 30.5 — ABNORMAL LOW
Hemoglobin: 10.1 — ABNORMAL LOW
Hemoglobin: 8.7 — ABNORMAL LOW
Hemoglobin: 8.8 — ABNORMAL LOW
MCHC: 33
MCHC: 34
MCV: 83.7
MCV: 86.5
Platelets: 121 — ABNORMAL LOW
Platelets: 107 — ABNORMAL LOW
Platelets: 116 — ABNORMAL LOW
Platelets: 116 — ABNORMAL LOW
RBC: 3.03 — ABNORMAL LOW
RBC: 3.53 — ABNORMAL LOW
RDW: 20.9 — ABNORMAL HIGH
RDW: 19 — ABNORMAL HIGH
RDW: 20.5 — ABNORMAL HIGH
WBC: 4.8
WBC: 4.8
WBC: 4.9
WBC: 5.2
WBC: 5.5

## 2011-04-05 LAB — CULTURE, BLOOD (ROUTINE X 2)
Culture: NO GROWTH
Culture: NO GROWTH

## 2011-04-05 LAB — POCT CARDIAC MARKERS
Myoglobin, poc: 64.6
Operator id: 4074
Troponin i, poc: <0.05

## 2011-04-05 LAB — DIFFERENTIAL
Basophils Absolute: 0.1
Eosinophils Relative: 28 — ABNORMAL HIGH
Lymphocytes Relative: 10 — ABNORMAL LOW
Metamyelocytes Relative: 0

## 2011-04-05 LAB — PROTIME-INR: Prothrombin Time: 14.4

## 2011-04-05 LAB — URINALYSIS, MICROSCOPIC ONLY
Bilirubin Urine: NEGATIVE
Hgb urine dipstick: NEGATIVE
Ketones, ur: NEGATIVE
Specific Gravity, Urine: 1.012
pH: 5.5

## 2011-04-05 LAB — IRON AND TIBC: Saturation Ratios: 14 — ABNORMAL LOW

## 2011-04-05 LAB — ABO/RH: ABO/RH(D): A POS

## 2011-04-11 ENCOUNTER — Encounter: Payer: Self-pay | Admitting: Oncology

## 2011-04-11 DIAGNOSIS — M1990 Unspecified osteoarthritis, unspecified site: Secondary | ICD-10-CM | POA: Insufficient documentation

## 2011-04-11 DIAGNOSIS — D630 Anemia in neoplastic disease: Secondary | ICD-10-CM | POA: Insufficient documentation

## 2011-04-11 DIAGNOSIS — D61818 Other pancytopenia: Secondary | ICD-10-CM | POA: Insufficient documentation

## 2011-04-11 DIAGNOSIS — D4620 Refractory anemia with excess of blasts, unspecified: Secondary | ICD-10-CM | POA: Insufficient documentation

## 2011-05-16 ENCOUNTER — Other Ambulatory Visit: Payer: Self-pay | Admitting: *Deleted

## 2011-05-16 MED ORDER — METOPROLOL TARTRATE 50 MG PO TABS: 50.0000 mg | ORAL_TABLET | Freq: Two times a day (BID) | ORAL | Status: DC

## 2011-06-29 ENCOUNTER — Telehealth: Payer: Self-pay | Admitting: Oncology

## 2011-06-29 NOTE — Telephone Encounter (Signed)
S/w pt today re appts for 1/11 and 4/10.

## 2011-07-12 ENCOUNTER — Telehealth: Payer: Self-pay | Admitting: *Deleted

## 2011-07-12 ENCOUNTER — Other Ambulatory Visit (HOSPITAL_BASED_OUTPATIENT_CLINIC_OR_DEPARTMENT_OTHER): Payer: Medicare Other

## 2011-07-12 ENCOUNTER — Other Ambulatory Visit: Payer: Self-pay | Admitting: Oncology

## 2011-07-12 DIAGNOSIS — D631 Anemia in chronic kidney disease: Secondary | ICD-10-CM

## 2011-07-12 DIAGNOSIS — N189 Chronic kidney disease, unspecified: Secondary | ICD-10-CM

## 2011-07-12 DIAGNOSIS — M1990 Unspecified osteoarthritis, unspecified site: Secondary | ICD-10-CM

## 2011-07-12 DIAGNOSIS — E875 Hyperkalemia: Secondary | ICD-10-CM

## 2011-07-12 LAB — CBC WITH DIFFERENTIAL/PLATELET
MCH: 27.4 pg (ref 27.2–33.4)
MCHC: 32.6 g/dL (ref 32.0–36.0)
RBC: 4.06 10*6/uL — ABNORMAL LOW (ref 4.20–5.82)
RDW: 21.6 % — ABNORMAL HIGH (ref 11.0–14.6)

## 2011-07-12 LAB — MANUAL DIFFERENTIAL
Band Neutrophils: 0 % (ref 0–10)
Basophil: 0 % (ref 0–2)
EOS: 73 % — ABNORMAL HIGH (ref 0–7)
Metamyelocytes: 0 % (ref 0–0)
Myelocytes: 0 % (ref 0–0)
PROMYELO: 0 % (ref 0–0)
nRBC: 0 % (ref 0–0)

## 2011-07-12 LAB — COMPREHENSIVE METABOLIC PANEL
Alkaline Phosphatase: 48 U/L (ref 39–117)
BUN: 23 mg/dL (ref 6–23)
Glucose, Bld: 105 mg/dL — ABNORMAL HIGH (ref 70–99)
Sodium: 142 mEq/L (ref 135–145)
Total Bilirubin: 0.4 mg/dL (ref 0.3–1.2)
Total Protein: 7.3 g/dL (ref 6.0–8.3)

## 2011-07-12 LAB — CHCC SMEAR

## 2011-07-12 NOTE — Telephone Encounter (Signed)
Message copied by Wende Mott on Fri Jul 12, 2011  3:43 PM ------      Message from: Jethro Bolus T      Created: Fri Jul 12, 2011  2:43 PM       Please call pt.  (FYI: he has mild MDS; on observation).  His CBC is stable.  Continue observation.  Thanks.

## 2011-07-12 NOTE — Telephone Encounter (Signed)
Called pt again and informed him of Dr. Lodema Pilot message that his CBC is stable and to continue observation.  Pt verbalized understanding and confirmed his next appt as scheduled in April.

## 2011-07-12 NOTE — Telephone Encounter (Signed)
Called home number x 2 and no answer. Left VM requesting pt return this RN's call.

## 2011-07-12 NOTE — Telephone Encounter (Signed)
Message copied by Wende Mott on Fri Jul 12, 2011  3:13 PM ------      Message from: Jethro Bolus T      Created: Fri Jul 12, 2011  2:43 PM       Please call pt.  (FYI: he has mild MDS; on observation).  His CBC is stable.  Continue observation.  Thanks.

## 2011-07-12 NOTE — Telephone Encounter (Signed)
gve the pt his April 2013 appt calendar

## 2011-08-09 ENCOUNTER — Encounter: Payer: Self-pay | Admitting: Cardiovascular Disease

## 2011-08-09 ENCOUNTER — Ambulatory Visit (INDEPENDENT_AMBULATORY_CARE_PROVIDER_SITE_OTHER): Payer: Medicare Other | Admitting: Cardiovascular Disease

## 2011-08-09 VITALS — BP 136/68 | HR 70 | Ht 73.0 in | Wt 220.4 lb

## 2011-08-09 DIAGNOSIS — I359 Nonrheumatic aortic valve disorder, unspecified: Secondary | ICD-10-CM

## 2011-08-09 DIAGNOSIS — I350 Nonrheumatic aortic (valve) stenosis: Secondary | ICD-10-CM

## 2011-08-09 DIAGNOSIS — E785 Hyperlipidemia, unspecified: Secondary | ICD-10-CM

## 2011-08-09 DIAGNOSIS — I2510 Atherosclerotic heart disease of native coronary artery without angina pectoris: Secondary | ICD-10-CM

## 2011-08-09 MED ORDER — ATORVASTATIN CALCIUM 20 MG PO TABS: 20.0000 mg | ORAL_TABLET | Freq: Every day | ORAL | Status: DC

## 2011-08-09 NOTE — Assessment & Plan Note (Signed)
Stable

## 2011-08-09 NOTE — Progress Notes (Signed)
[name] Date of Birth  29-Jul-1933 [facility]     [place] [facility]  [address]    [address]   [address] [place], [place]  [address]    [place], [place]  [address] [phone]  Fax  [phone]  [phone]  Fax [phone]  Problem list 1. Aortic stenosis 2. Coronary artery disease-status post CABG 3. History of knee surgery 4. Diabetes mellitus 5. Renal insufficiency 6. Hyperlipidemia  History of Present Illness:  [name] is a 78 year old gentleman with a history of coronary artery disease and aortic stenosis. He was a previous patient of Dr. [name]  He is done well. He's not having episodes of chest pain or shortness breath. He's able to do all of his normal activities without any significant problems.  Current Outpatient Prescriptions on File Prior to Visit  Medication Sig Dispense Refill  . allopurinol (ZYLOPRIM) 300 MG tablet Take 300 mg by mouth daily.        Marland Kitchen aspirin 325 MG tablet Take 325 mg by mouth daily.        Marland Kitchen CALCIUM PO Take by mouth.       . doxazosin (CARDURA) 4 MG tablet Take 4 mg by mouth at bedtime.        . ferrous gluconate (FERGON) 324 MG tablet Take 324 mg by mouth 2 (two) times daily.        . furosemide (LASIX) 80 MG tablet Take 80 mg by mouth 2 (two) times daily.        Marland Kitchen GLUCOSAMINE PO Take 2,000 mg by mouth daily.       Marland Kitchen losartan (COZAAR) 100 MG tablet Take 100 mg by mouth daily.        . metFORMIN (GLUMETZA) 1000 MG (MOD) 24 hr tablet Take 1,000 mg by mouth daily with breakfast.        . metoprolol (LOPRESSOR) 50 MG tablet Take 1 tablet (50 mg total) by mouth 2 (two) times daily.  60 tablet  5  . Multiple Vitamins-Minerals (MULTIVITAMIN WITH MINERALS) tablet Take 100 tablets by mouth daily.        Marland Kitchen NIFEdipine (ADALAT CC) 90 MG 24 hr tablet Take 90 mg by mouth daily.        . Omega-3 Fatty Acids (FISH OIL PO) Take 1 capsule by mouth daily.        . potassium chloride SA (K-DUR,KLOR-CON) 20 MEQ tablet Take 20 mEq by  mouth 2 (two) times daily.        . sildenafil (VIAGRA) 100 MG tablet Take 100 mg by mouth daily as needed.        . simvastatin (ZOCOR) 40 MG tablet Take 40 mg by mouth at bedtime.          No Known Allergies  Past Medical History  Diagnosis Date  . Diabetes mellitus   . Hypertension     since his Mid -- 20's  . Hyperlipidemia   . Coronary artery disease     CABG x4  -- with Est. EF of 50-60%  --  07/06/2007  . Mild aortic stenosis   . Anemia, chronic disease     Followed by Dr. [name]  . Thrombocytopenia   . Renal insufficiency   . Gout   . MDS (myelodysplastic syndrome), low grade   . Osteoarthritis     Past Surgical History  Procedure Date  . Cardiac catheterization 06/29/2007     Left heart catheterization with selective coronary angiography,  left ventricular angiography -- Probable normal left ventricular function and dilated aortic root -- Extensive and heavy calcification of coronary arteries --  [name]. [name], M.D.       . Total knee arthroplasty 11/25/2008    Right knee - Severe degenerative arthritis with a varus deformity of the right knee --  [name], M.D.   . Coronary artery bypass graft 07/06/2007     x4 using a left internal mammary  artery graft to the left anterior descending coronary artery with a saphenous vein graft to the diagonal branch to the left anterior descending artery, a saphenous vein graft to the obtuse marginal branch of the left circumflex coronary artery, and a saphenous vein graft to  the posterior descending artery of the right coronary artery -  [name], M.D    History  Smoking status  . Former Smoker  . Quit date: 07/01/1980  Smokeless tobacco  . Not on file    History  Alcohol Use No    No family history on file.  Reviw of Systems:  Reviewed in the HPI.  All other systems are negative.  Physical Exam: Blood pressure 136/68, pulse 70, height 6\' 1"  (1.854 m), weight 220 lb 6.4 oz (99.973 kg). General: Well  developed, well nourished, in no acute distress.  Head: Normocephalic, atraumatic, sclera non-icteric, mucus membranes are moist,   Neck: Supple. Negative for carotid bruits. JVD not elevated.  Lungs: Clear bilaterally to auscultation without wheezes, rales, or rhonchi. Breathing is unlabored.  Heart: RRR with a 1-2 systolic murmur  Abdomen: Soft, non-tender, non-distended with normoactive bowel sounds. No hepatomegaly. No rebound/guarding. No obvious abdominal masses.  Msk:  Strength and tone appear normal for age.  Extremities: No clubbing or cyanosis. No edema.  Distal pedal pulses are 2+ and equal bilaterally.  Neuro: Alert and oriented X 3. Moves all extremities spontaneously.  Psych:  Responds to questions appropriately with a normal affect.  ECG: Normal sinus rhythm with first-degree AV block. Otherwise the EKG is unremarkable.  Assessment / Plan:

## 2011-08-09 NOTE — Assessment & Plan Note (Signed)
He's taking simvastatin 40 mg a day and is also taking nifedipine. There is some potential for interaction. I would like her him to discontinue the simvastatin and to start atorvastatin 20 mg a day. We'll see him back for fasting labs in 3 months and then again in 6 months when I see him for an office visit.

## 2011-08-09 NOTE — Patient Instructions (Signed)
Your physician wants you to follow-up in: 6 MONTHS You will receive a reminder letter in the mail two months in advance. If you don't receive a letter, please call our office to schedule the follow-up appointment.  Your physician recommends that you return for a FASTING lipid profile: 3 MONTHS    Your physician has recommended you make the following change in your medication:   STOP SIMVASTATIN  START ATORVASTATIN 20 MG, GET 3 MONTH LAB WORK TO MAKE SURE MEDICATION IS AGREEING WITH YOUR BODY.

## 2011-10-09 ENCOUNTER — Other Ambulatory Visit: Payer: Medicare Other | Admitting: Lab

## 2011-10-09 ENCOUNTER — Ambulatory Visit: Payer: Medicare Other | Admitting: Oncology

## 2011-10-09 ENCOUNTER — Ambulatory Visit (HOSPITAL_BASED_OUTPATIENT_CLINIC_OR_DEPARTMENT_OTHER): Payer: Medicare Other | Admitting: Oncology

## 2011-10-09 ENCOUNTER — Other Ambulatory Visit (HOSPITAL_BASED_OUTPATIENT_CLINIC_OR_DEPARTMENT_OTHER): Payer: Medicare Other | Admitting: Lab

## 2011-10-09 ENCOUNTER — Encounter: Payer: Self-pay | Admitting: Oncology

## 2011-10-09 ENCOUNTER — Telehealth: Payer: Self-pay | Admitting: Oncology

## 2011-10-09 VITALS — BP 144/75 | HR 71 | Temp 97.0°F | Ht 73.0 in | Wt 226.3 lb

## 2011-10-09 DIAGNOSIS — E785 Hyperlipidemia, unspecified: Secondary | ICD-10-CM

## 2011-10-09 DIAGNOSIS — I10 Essential (primary) hypertension: Secondary | ICD-10-CM

## 2011-10-09 DIAGNOSIS — M549 Dorsalgia, unspecified: Secondary | ICD-10-CM

## 2011-10-09 DIAGNOSIS — D469 Myelodysplastic syndrome, unspecified: Secondary | ICD-10-CM

## 2011-10-09 DIAGNOSIS — D4620 Refractory anemia with excess of blasts, unspecified: Secondary | ICD-10-CM

## 2011-10-09 LAB — COMPREHENSIVE METABOLIC PANEL
CO2: 26 mEq/L (ref 19–32)
Creatinine, Ser: 1.21 mg/dL (ref 0.50–1.35)
Glucose, Bld: 135 mg/dL — ABNORMAL HIGH (ref 70–99)
Total Bilirubin: 0.5 mg/dL (ref 0.3–1.2)

## 2011-10-09 LAB — CBC WITH DIFFERENTIAL/PLATELET
Basophils Absolute: 0.1 10*3/uL (ref 0.0–0.1)
Eosinophils Absolute: 6.6 10*3/uL — ABNORMAL HIGH (ref 0.0–0.5)
HGB: 11.9 g/dL — ABNORMAL LOW (ref 13.0–17.1)
LYMPH%: 10.1 % — ABNORMAL LOW (ref 14.0–49.0)
MCV: 83.4 fL (ref 79.3–98.0)
MONO%: 2 % (ref 0.0–14.0)
NEUT#: 1.5 10*3/uL (ref 1.5–6.5)
NEUT%: 15.8 % — ABNORMAL LOW (ref 39.0–75.0)
Platelets: 87 10*3/uL — ABNORMAL LOW (ref 140–400)

## 2011-10-09 NOTE — Telephone Encounter (Signed)
appts made and printed for pt aom °

## 2011-10-09 NOTE — Progress Notes (Signed)
[facility] OFFICE PROGRESS NOTE  Cc:  Daisy Floro, MD, MD  DIAGNOSIS:  1. Myelodysplastic syndrome, IPSS score low risk. 2. Normocytic anemia, 2/2 MDS. 3. Mild thrombocytopenia, 2/2 MDS.  CURRENT THERAPY: Observation  INTERVAL HISTORY: [name] 78 y.o. male returns to the clinic today with his wife for followup.  He reports that he continues to do well. Pain from DJD in his back is stable. Has routine Cardiology follow-up recently. Changed from simvastatin to Atorvastatin. No complaints of myalgias. He denies any bleeding symptoms, gum bleed, epistaxis, hemoptysis, hematemesis, melena, hematochezia, hematuria, skin rash, petechiae.  Past Medical History  Diagnosis Date  . Diabetes mellitus   . Hypertension     since his Mid -- 20's  . Hyperlipidemia   . Coronary artery disease     CABG x4  -- with Est. EF of 50-60%  --  07/06/2007  . Mild aortic stenosis   . Anemia, chronic disease     Followed by Dr. [name]  . Thrombocytopenia   . Renal insufficiency   . Gout   . MDS (myelodysplastic syndrome), low grade   . Osteoarthritis     Past Surgical History  Procedure Date  . Cardiac catheterization 06/29/2007     Left heart catheterization with selective coronary angiography, left ventricular angiography -- Probable normal left ventricular function and dilated aortic root -- Extensive and heavy calcification of coronary arteries --  [name]. [name], M.D.       . Total knee arthroplasty 11/25/2008    Right knee - Severe degenerative arthritis with a varus deformity of the right knee --  [name], M.D.   . Coronary artery bypass graft 07/06/2007     x4 using a left internal mammary  artery graft to the left anterior descending coronary artery with a saphenous vein graft to the diagonal branch to the left anterior descending artery, a saphenous vein graft to the obtuse marginal branch of the left circumflex coronary artery, and a saphenous vein graft to   the posterior descending artery of the right coronary artery -  [name], M.D    Current Outpatient Prescriptions  Medication Sig Dispense Refill  . allopurinol (ZYLOPRIM) 300 MG tablet Take 300 mg by mouth daily.        Marland Kitchen aspirin 325 MG tablet Take 325 mg by mouth daily.        Marland Kitchen atorvastatin (LIPITOR) 20 MG tablet Take 1 tablet (20 mg total) by mouth daily.  30 tablet  6  . CALCIUM PO Take by mouth.       . doxazosin (CARDURA) 4 MG tablet Take 4 mg by mouth at bedtime.        . ferrous gluconate (FERGON) 324 MG tablet Take 324 mg by mouth 2 (two) times daily.        . furosemide (LASIX) 80 MG tablet Take 80 mg by mouth 2 (two) times daily.        Marland Kitchen GLUCOSAMINE PO Take 2,000 mg by mouth daily.       Marland Kitchen losartan (COZAAR) 100 MG tablet Take 100 mg by mouth daily.        . metFORMIN (GLUMETZA) 1000 MG (MOD) 24 hr tablet Take 1,000 mg by mouth daily with breakfast.        . metoprolol (LOPRESSOR) 50 MG tablet Take 1 tablet (50 mg total) by mouth 2 (two) times daily.  60 tablet  5  . Multiple Vitamins-Minerals (MULTIVITAMIN WITH MINERALS) tablet  Take 100 tablets by mouth daily.        Marland Kitchen NIFEdipine (ADALAT CC) 90 MG 24 hr tablet Take 90 mg by mouth daily.        . potassium chloride SA (K-DUR,KLOR-CON) 20 MEQ tablet Take 20 mEq by mouth 2 (two) times daily.          ALLERGIES:   has no known allergies.  REVIEW OF SYSTEMS:  The rest of the 14-point review of system was negative.   Filed Vitals:   10/09/11 0856  BP: 144/75  Pulse: 71  Temp: 97 F (36.1 C)   Wt Readings from Last 3 Encounters:  10/09/11 226 lb 4.8 oz (102.649 kg)  08/09/11 220 lb 6.4 oz (99.973 kg)  01/08/11 225 lb (102.059 kg)   ECOG Performance status: 1  PHYSICAL EXAMINATION: General:  well-nourished in no acute distress.  Eyes:  no scleral icterus.  ENT:  There were no oropharyngeal lesions.  Neck was without thyromegaly.  Lymphatics:  Negative cervical, supraclavicular or axillary adenopathy.  Respiratory:  lungs were clear bilaterally without wheezing or crackles.  Cardiovascular:  Regular rate and rhythm, S1/S2; there was a 3/6 systolic murmur best heard in the left sternal border without radiation.  She has 1+ edema to mid shin.GI:  abdomen was soft, flat, nontender, nondistended, without organomegaly.  Muscoloskeletal:  no spinal tenderness of palpation of vertebral spine.  Skin exam was without echymosis, petichae.  Neuro exam was nonfocal.  Patient was able to get on and off exam table without assistance.  Gait was normal.  Patient was alerted and oriented.  Attention was good.   Language was appropriate.  Mood was normal without depression.  Speech was not pressured.  Thought content was not tangential.    LABORATORY/RADIOLOGY DATA:  Lab Results  Component Value Date   WBC 9.2 10/09/2011   HGB 11.9* 10/09/2011   HCT 35.9* 10/09/2011   PLT 87* 10/09/2011   GLUCOSE 105* 07/12/2011   CHOL 81 01/08/2011   TRIG 92.0 01/08/2011   HDL 31.50* 01/08/2011   LDLCALC 31 01/08/2011   ALKPHOS 48 07/12/2011   ALT 12 07/12/2011   AST 23 07/12/2011   NA 142 07/12/2011   K 4.6 07/12/2011   CL 105 07/12/2011   CREATININE 1.35 07/12/2011   BUN 23 07/12/2011   CO2 28 07/12/2011   INR 1.4 12/02/2008   HGBA1C  Value: 5.6 (NOTE)   The ADA recommends the following therapeutic goals for glycemic   control related to Hgb A1C measurement:   Goal of Therapy:   < 7.0% Hgb A1C   Action Suggested:  > 8.0% Hgb A1C   Ref:  Diabetes Care, 22, Suppl. 1, 1999 07/02/2007   ASSESSMENT AND PLAN:   1. Myelodysplastic syndrome:  I discussed with Mr. Haugen that his CBC has been quite stable.  I discussed with him that again I recommend observation.  In the future, if anything worsens with ANC of less than 1, hemoglobin less than 9, platelet count of less than 50, then may consider repeat bone biopsy for further evaluation to make sure he is not progressing into a higher risk MDS.  He does not require Aranesp as his Hgb is above 11.  2. Low back  pain, most likely secondary to degenerative joint disease. Remains stable.  3. Hypertension. He is on Lasix, losartan, nifedipine, and metoprolol per Cardiology. He will follow-up with Dr Melburn Popper as scheduled.   4. Hyperlipidemia.  He is on atorvastatin.  5.  History of gout.  He is on allopurinol.  6. Follow up with lab tests in  2 and 4 months. Labs with return visit in 6 months.   The patient was seen and examined with Dr [name]. >90% of plan of care developed by Dr [name]. The length of time of the face-to-face encounter was 20  minutes. More than 50% of time was spent counseling and coordination of care.

## 2011-11-07 ENCOUNTER — Other Ambulatory Visit (INDEPENDENT_AMBULATORY_CARE_PROVIDER_SITE_OTHER): Payer: Medicare Other

## 2011-11-07 DIAGNOSIS — I2510 Atherosclerotic heart disease of native coronary artery without angina pectoris: Secondary | ICD-10-CM

## 2011-11-07 DIAGNOSIS — E785 Hyperlipidemia, unspecified: Secondary | ICD-10-CM

## 2011-11-07 LAB — HEPATIC FUNCTION PANEL
AST: 23 U/L (ref 0–37)
Albumin: 4.1 g/dL (ref 3.5–5.2)
Alkaline Phosphatase: 52 U/L (ref 39–117)
Bilirubin, Direct: 0.1 mg/dL (ref 0.0–0.3)
Total Protein: 7.6 g/dL (ref 6.0–8.3)

## 2011-11-07 LAB — BASIC METABOLIC PANEL
BUN: 21 mg/dL (ref 6–23)
CO2: 27 mEq/L (ref 19–32)
Calcium: 9.7 mg/dL (ref 8.4–10.5)
Chloride: 107 mEq/L (ref 96–112)
Creatinine, Ser: 1.2 mg/dL (ref 0.4–1.5)
GFR: 72.97 mL/min (ref >60.00)
Glucose, Bld: 125 mg/dL — ABNORMAL HIGH (ref 70–99)
Potassium: 4.2 mEq/L (ref 3.5–5.1)
Sodium: 142 mEq/L (ref 135–145)

## 2011-11-07 LAB — LIPID PANEL
Total CHOL/HDL Ratio: 2
VLDL: 13.6 mg/dL (ref 0.0–40.0)

## 2011-11-12 ENCOUNTER — Other Ambulatory Visit: Payer: Self-pay | Admitting: Nurse Practitioner

## 2011-12-04 ENCOUNTER — Other Ambulatory Visit: Payer: Self-pay | Admitting: *Deleted

## 2011-12-04 MED ORDER — FERROUS GLUCONATE 324 (38 FE) MG PO TABS: 324.0000 mg | ORAL_TABLET | Freq: Two times a day (BID) | ORAL | Status: DC

## 2011-12-09 ENCOUNTER — Other Ambulatory Visit (HOSPITAL_BASED_OUTPATIENT_CLINIC_OR_DEPARTMENT_OTHER): Payer: Medicare Other

## 2011-12-09 DIAGNOSIS — D4620 Refractory anemia with excess of blasts, unspecified: Secondary | ICD-10-CM

## 2011-12-09 LAB — CBC WITH DIFFERENTIAL/PLATELET
BASO%: 1 % (ref 0.0–2.0)
EOS%: 79.1 % — ABNORMAL HIGH (ref 0.0–7.0)
LYMPH%: 8.9 % — ABNORMAL LOW (ref 14.0–49.0)
MCH: 27.5 pg (ref 27.2–33.4)
MCHC: 32.6 g/dL (ref 32.0–36.0)
MONO#: 0.2 10*3/uL (ref 0.1–0.9)
RBC: 4.4 10*6/uL (ref 4.20–5.82)
WBC: 10 10*3/uL (ref 4.0–10.3)
lymph#: 0.9 10*3/uL (ref 0.9–3.3)

## 2012-02-10 ENCOUNTER — Other Ambulatory Visit (HOSPITAL_BASED_OUTPATIENT_CLINIC_OR_DEPARTMENT_OTHER): Payer: Medicare Other | Admitting: Lab

## 2012-02-10 DIAGNOSIS — D4620 Refractory anemia with excess of blasts, unspecified: Secondary | ICD-10-CM

## 2012-02-10 LAB — CBC WITH DIFFERENTIAL/PLATELET
BASO%: 0.8 % (ref 0.0–2.0)
EOS%: 77.5 % — ABNORMAL HIGH (ref 0.0–7.0)
HCT: 34.9 % — ABNORMAL LOW (ref 38.4–49.9)
HGB: 11.4 g/dL — ABNORMAL LOW (ref 13.0–17.1)
MCHC: 32.8 g/dL (ref 32.0–36.0)
MONO#: 0.1 10*3/uL (ref 0.1–0.9)
NEUT%: 10.8 % — ABNORMAL LOW (ref 39.0–75.0)
RDW: 20.9 % — ABNORMAL HIGH (ref 11.0–14.6)
WBC: 9 10*3/uL (ref 4.0–10.3)
lymph#: 0.9 10*3/uL (ref 0.9–3.3)

## 2012-02-12 ENCOUNTER — Telehealth: Payer: Self-pay

## 2012-02-12 NOTE — Telephone Encounter (Signed)
Message copied by Kallie Locks on Wed Feb 12, 2012  4:44 PM ------      Message from: Myrtis Ser      Created: Tue Feb 11, 2012  9:21 AM       Please call patient and let him know that his WBC, Hgb, and platelets remains stable. Recommend continued observation. Keep appointments as scheduled.            Thanks

## 2012-04-06 ENCOUNTER — Other Ambulatory Visit: Payer: Self-pay | Admitting: Cardiovascular Disease

## 2012-04-06 NOTE — Telephone Encounter (Signed)
Fax Received. Refill Completed. [name] (R.M.A)   

## 2012-04-12 NOTE — Patient Instructions (Addendum)
1.  Diagnosis:  MDS (myelodysplastic syndrome); low risk features.   2. Treatment:  Continue observation with blood count in about 3 and then 6 months.  Follow up in about 9 months.  Repeat bone marrow biopsy in the future when blood counts lower significantly.

## 2012-04-13 ENCOUNTER — Ambulatory Visit (HOSPITAL_BASED_OUTPATIENT_CLINIC_OR_DEPARTMENT_OTHER): Payer: Medicare Other | Admitting: Oncology

## 2012-04-13 ENCOUNTER — Other Ambulatory Visit (HOSPITAL_BASED_OUTPATIENT_CLINIC_OR_DEPARTMENT_OTHER): Payer: Medicare Other | Admitting: Lab

## 2012-04-13 ENCOUNTER — Telehealth: Payer: Self-pay | Admitting: Oncology

## 2012-04-13 VITALS — BP 133/69 | HR 75 | Temp 97.3°F | Resp 20 | Ht 72.0 in | Wt 224.5 lb

## 2012-04-13 DIAGNOSIS — E785 Hyperlipidemia, unspecified: Secondary | ICD-10-CM

## 2012-04-13 DIAGNOSIS — D469 Myelodysplastic syndrome, unspecified: Secondary | ICD-10-CM

## 2012-04-13 DIAGNOSIS — M545 Low back pain: Secondary | ICD-10-CM

## 2012-04-13 DIAGNOSIS — I10 Essential (primary) hypertension: Secondary | ICD-10-CM

## 2012-04-13 DIAGNOSIS — D4620 Refractory anemia with excess of blasts, unspecified: Secondary | ICD-10-CM

## 2012-04-13 LAB — COMPREHENSIVE METABOLIC PANEL (CC13)
Albumin: 4 g/dL (ref 3.5–5.0)
Alkaline Phosphatase: 52 U/L (ref 40–150)
BUN: 20 mg/dL (ref 7.0–26.0)
CO2: 22 mEq/L (ref 22–29)
Calcium: 10.2 mg/dL (ref 8.4–10.4)
Chloride: 109 mEq/L — ABNORMAL HIGH (ref 98–107)
Glucose: 120 mg/dl — ABNORMAL HIGH (ref 70–99)
Potassium: 4.3 mEq/L (ref 3.5–5.1)
Sodium: 142 mEq/L (ref 136–145)
Total Protein: 7.3 g/dL (ref 6.4–8.3)

## 2012-04-13 LAB — CBC WITH DIFFERENTIAL/PLATELET
Basophils Absolute: 0.1 10*3/uL (ref 0.0–0.1)
Eosinophils Absolute: 5.5 10*3/uL — ABNORMAL HIGH (ref 0.0–0.5)
HGB: 11.4 g/dL — ABNORMAL LOW (ref 13.0–17.1)
MCV: 85.2 fL (ref 79.3–98.0)
MONO#: 0.1 10*3/uL (ref 0.1–0.9)
MONO%: 1.6 % (ref 0.0–14.0)
NEUT#: 1.4 10*3/uL — ABNORMAL LOW (ref 1.5–6.5)
RBC: 4.02 10*6/uL — ABNORMAL LOW (ref 4.20–5.82)
RDW: 20.9 % — ABNORMAL HIGH (ref 11.0–14.6)
WBC: 7.8 10*3/uL (ref 4.0–10.3)
lymph#: 0.7 10*3/uL — ABNORMAL LOW (ref 0.9–3.3)

## 2012-04-13 NOTE — Telephone Encounter (Signed)
gv pt appt schedule for January - April - July 2014.

## 2012-04-13 NOTE — Progress Notes (Signed)
[facility]  Telephone:([phone] [phone]   OFFICE PROGRESS NOTE   Cc:  Daisy Floro, MD  DIAGNOSIS: DIAGNOSIS:   Myelodysplastic syndrome, IPSS score low risk with mild chronic normocytic anemia and thrombocytopenia.  CURRENT THERAPY: Observation  INTERVAL HISTORY: [name] 78 y.o. male returns for regular follow up with his wife.  He reports feeling stable.  He has chronic right knee pain.  Pain is much better compared to prior to knee replacement.  However, the right knee has been weaker than the left.  With respect to his MDS, he denied anorexia, weight loss, fever, mucositis, recurrent infection, bleeding.  Patient denies fever, anorexia, weight loss, fatigue, headache, visual changes, confusion, drenching night sweats, palpable lymph node swelling, mucositis, odynophagia, dysphagia, nausea vomiting, jaundice, chest pain, palpitation, shortness of breath, dyspnea on exertion, productive cough, gum bleeding, epistaxis, hematemesis, hemoptysis, abdominal pain, abdominal swelling, early satiety, melena, hematochezia, hematuria, skin rash, spontaneous bleeding, heat or cold intolerance, bowel bladder incontinence, paresthesia, depression, suicidal or homicidal ideation, feeling hopelessness.   Past Medical History  Diagnosis Date  . Diabetes mellitus   . Hypertension     since his Mid -- 20's  . Hyperlipidemia   . Coronary artery disease     CABG x4  -- with Est. EF of 50-60%  --  07/06/2007  . Mild aortic stenosis   . Anemia, chronic disease     Followed by Dr. [name]  . Thrombocytopenia   . Renal insufficiency   . Gout   . MDS (myelodysplastic syndrome), low grade   . Osteoarthritis     Past Surgical History  Procedure Date  . Cardiac catheterization 06/29/2007     Left heart catheterization with selective coronary angiography, left ventricular angiography -- Probable normal left ventricular function and dilated aortic root -- Extensive  and heavy calcification of coronary arteries --  [name]. [name], M.D.       . Total knee arthroplasty 11/25/2008    Right knee - Severe degenerative arthritis with a varus deformity of the right knee --  [name], M.D.   . Coronary artery bypass graft 07/06/2007     x4 using a left internal mammary  artery graft to the left anterior descending coronary artery with a saphenous vein graft to the diagonal branch to the left anterior descending artery, a saphenous vein graft to the obtuse marginal branch of the left circumflex coronary artery, and a saphenous vein graft to  the posterior descending artery of the right coronary artery -  [name], M.D    Current Outpatient Prescriptions  Medication Sig Dispense Refill  . allopurinol (ZYLOPRIM) 300 MG tablet Take 300 mg by mouth daily.        Marland Kitchen aspirin 325 MG tablet Take 325 mg by mouth daily.        Marland Kitchen atorvastatin (LIPITOR) 20 MG tablet TAKE 1 TABLET (20 MG TOTAL) BY MOUTH DAILY.  30 tablet  6  . CALCIUM PO Take by mouth.       . doxazosin (CARDURA) 4 MG tablet Take 4 mg by mouth at bedtime.        . ferrous gluconate (FERGON) 324 MG tablet Take 1 tablet (324 mg total) by mouth 2 (two) times daily.  60 tablet  6  . furosemide (LASIX) 80 MG tablet Take 80 mg by mouth 2 (two) times daily.        Marland Kitchen GLUCOSAMINE PO Take 2,000 mg by mouth daily.       Marland Kitchen  losartan (COZAAR) 100 MG tablet Take 100 mg by mouth daily.        . metFORMIN (GLUMETZA) 1000 MG (MOD) 24 hr tablet Take 1,000 mg by mouth daily with breakfast.        . metoprolol (LOPRESSOR) 50 MG tablet TAKE 1 TABLET (50 MG TOTAL) BY MOUTH 2 (TWO) TIMES DAILY.  60 tablet  5  . Multiple Vitamins-Minerals (MULTIVITAMIN WITH MINERALS) tablet Take 100 tablets by mouth daily.        Marland Kitchen NIFEdipine (ADALAT CC) 90 MG 24 hr tablet Take 90 mg by mouth daily.        . potassium chloride SA (K-DUR,KLOR-CON) 20 MEQ tablet Take 20 mEq by mouth 2 (two) times daily.          ALLERGIES:   has no  known allergies.  REVIEW OF SYSTEMS:  The rest of the 14-point review of system was negative.   Filed Vitals:   04/13/12 0906  BP: 133/69  Pulse: 75  Temp: 97.3 F (36.3 C)  Resp: 20   Wt Readings from Last 3 Encounters:  04/13/12 224 lb 8 oz (101.833 kg)  10/09/11 226 lb 4.8 oz (102.649 kg)  08/09/11 220 lb 6.4 oz (99.973 kg)   ECOG Performance status: 1 due to osteoarthritis.   PHYSICAL EXAMINATION:  General:  well-nourished man, in no acute distress.  Eyes:  no scleral icterus.  ENT:  There were no oropharyngeal lesions.  Neck was without thyromegaly.  Lymphatics:  Negative cervical, supraclavicular or axillary adenopathy.  Respiratory: lungs were clear bilaterally without wheezing or crackles.  Cardiovascular:  Regular rate and rhythm, S1/S2, without rub or gallop.  There was a 3/6 systolic murmur best heard in the There was 1+ bilateral pedal edema.  GI:  abdomen was soft, flat, nontender, nondistended, without organomegaly.  Muscoloskeletal:  no spinal tenderness of palpation of vertebral spine.  Skin exam was without echymosis, petichae.  Neuro exam was nonfocal.  Patient was able to get on and off exam table without assistance.  Gait was normal.  Patient was alerted and oriented.  Attention was good.   Language was appropriate.  Mood was normal without depression.  Speech was not pressured.  Thought content was not tangential.       LABORATORY/RADIOLOGY DATA:  Lab Results  Component Value Date   WBC 7.8 04/13/2012   HGB 11.4* 04/13/2012   HCT 34.3* 04/13/2012   PLT 84* 04/13/2012   GLUCOSE 125* 11/07/2011   CHOL 78 11/07/2011   TRIG 68.0 11/07/2011   HDL 34.70* 11/07/2011   LDLCALC 30 11/07/2011   ALKPHOS 52 11/07/2011   ALT 15 11/07/2011   AST 23 11/07/2011   NA 142 11/07/2011   K 4.2 11/07/2011   CL 107 11/07/2011   CREATININE 1.2 11/07/2011   BUN 21 11/07/2011   CO2 27 11/07/2011   INR 1.4 12/02/2008   HGBA1C  Value: 5.6 (NOTE)   The ADA recommends the following therapeutic goals for  glycemic   control related to Hgb A1C measurement:   Goal of Therapy:   < 7.0% Hgb A1C   Action Suggested:  > 8.0% Hgb A1C   Ref:  Diabetes Care, 22, Suppl. 1, 1999 07/02/2007     ASSESSMENT AND PLAN:   1. Myelodysplastic syndrome:Again, he has stable disease with only mild anemia and thrombocytopenia.  There is no evidence of disease progression of his MDS to higher risk category.  I again recommended watchful observation and repeat bone marrow biopsy  in the future if and when he develops significantly worsened cytopenia.  2. Low back pain, most likely secondary to degenerative joint disease. Remains stable.  3. Hypertension. He is on Lasix, losartan, nifedipine, and metoprolol per Cardiology.  4. Hyperlipidemia. He is on atorvastatin.  5. History of gout. He is on allopurinol.  6. Follow up with lab tests in 3, and 6 months at the [facility]. Labs with return visit in about  9months. 7.

## 2012-05-22 ENCOUNTER — Other Ambulatory Visit: Payer: Self-pay | Admitting: *Deleted

## 2012-05-22 MED ORDER — METOPROLOL TARTRATE 50 MG PO TABS: 50.0000 mg | ORAL_TABLET | Freq: Two times a day (BID) | ORAL | Status: DC

## 2012-05-22 NOTE — Telephone Encounter (Signed)
Pt needs appointment then refill can be made Fax Received. Refill Completed. Suan Pyeatt Chowoe (R.M.A)   

## 2012-05-26 ENCOUNTER — Other Ambulatory Visit: Payer: Self-pay | Admitting: *Deleted

## 2012-05-26 NOTE — Telephone Encounter (Signed)
Opened in Error.

## 2012-07-13 ENCOUNTER — Telehealth: Payer: Self-pay | Admitting: *Deleted

## 2012-07-13 ENCOUNTER — Other Ambulatory Visit (HOSPITAL_BASED_OUTPATIENT_CLINIC_OR_DEPARTMENT_OTHER): Payer: Medicare Other | Admitting: Lab

## 2012-07-13 DIAGNOSIS — D4620 Refractory anemia with excess of blasts, unspecified: Secondary | ICD-10-CM

## 2012-07-13 DIAGNOSIS — D469 Myelodysplastic syndrome, unspecified: Secondary | ICD-10-CM

## 2012-07-13 LAB — CBC WITH DIFFERENTIAL/PLATELET
Basophils Absolute: 0.1 10*3/uL (ref 0.0–0.1)
EOS%: 72.6 % — ABNORMAL HIGH (ref 0.0–7.0)
HGB: 11.9 g/dL — ABNORMAL LOW (ref 13.0–17.1)
MCH: 28.2 pg (ref 27.2–33.4)
MONO#: 0.2 10*3/uL (ref 0.1–0.9)
NEUT#: 1.1 10*3/uL — ABNORMAL LOW (ref 1.5–6.5)
RDW: 20.8 % — ABNORMAL HIGH (ref 11.0–14.6)
WBC: 8.8 10*3/uL (ref 4.0–10.3)
lymph#: 1 10*3/uL (ref 0.9–3.3)

## 2012-07-13 NOTE — Telephone Encounter (Signed)
Spoke with patient, let him know that his MDS is stable and will continue observation.

## 2012-07-13 NOTE — Telephone Encounter (Signed)
Message copied by Reesa Chew on Mon Jul 13, 2012  4:16 PM ------      Message from: Su Hilt C      Created: Mon Jul 13, 2012  3:13 PM                   ----- Message -----         From: Exie Parody, MD         Sent: 07/13/2012  10:14 AM           To: Marlowe Aschoff, RN            Please call patient. His MDS is stable with stable counts. Continue observation at this time. Thank you.

## 2012-09-25 ENCOUNTER — Other Ambulatory Visit: Payer: Self-pay | Admitting: *Deleted

## 2012-09-25 MED ORDER — METOPROLOL TARTRATE 50 MG PO TABS: 50.0000 mg | ORAL_TABLET | Freq: Two times a day (BID) | ORAL | Status: DC

## 2012-09-25 NOTE — Telephone Encounter (Signed)
Fax Received. Refill Completed. Shai Mckenzie Chowoe (R.M.A)  NO REFILL UNTIL APPOINTMENT  

## 2012-10-12 ENCOUNTER — Encounter: Payer: Self-pay | Admitting: Oncology

## 2012-10-12 ENCOUNTER — Other Ambulatory Visit (HOSPITAL_BASED_OUTPATIENT_CLINIC_OR_DEPARTMENT_OTHER): Payer: Medicare Other | Admitting: Lab

## 2012-10-12 DIAGNOSIS — D469 Myelodysplastic syndrome, unspecified: Secondary | ICD-10-CM

## 2012-10-12 DIAGNOSIS — D4620 Refractory anemia with excess of blasts, unspecified: Secondary | ICD-10-CM

## 2012-10-12 LAB — CBC WITH DIFFERENTIAL/PLATELET
Basophils Absolute: 0 10*3/uL (ref 0.0–0.1)
Eosinophils Absolute: 6.3 10*3/uL — ABNORMAL HIGH (ref 0.0–0.5)
HCT: 36.2 % — ABNORMAL LOW (ref 38.4–49.9)
HGB: 11.8 g/dL — ABNORMAL LOW (ref 13.0–17.1)
MCV: 83.4 fL (ref 79.3–98.0)
MONO%: 0.6 % (ref 0.0–14.0)
NEUT#: 1.1 10*3/uL — ABNORMAL LOW (ref 1.5–6.5)
RDW: 18.9 % — ABNORMAL HIGH (ref 11.0–14.6)
lymph#: 0.9 10*3/uL (ref 0.9–3.3)

## 2012-10-12 LAB — TECHNOLOGIST REVIEW

## 2012-12-21 ENCOUNTER — Ambulatory Visit: Payer: Medicare Other | Admitting: Cardiovascular Disease

## 2013-01-11 ENCOUNTER — Ambulatory Visit (HOSPITAL_BASED_OUTPATIENT_CLINIC_OR_DEPARTMENT_OTHER): Payer: Medicare Other | Admitting: Oncology

## 2013-01-11 ENCOUNTER — Encounter: Payer: Self-pay | Admitting: Oncology

## 2013-01-11 ENCOUNTER — Other Ambulatory Visit (HOSPITAL_BASED_OUTPATIENT_CLINIC_OR_DEPARTMENT_OTHER): Payer: Self-pay

## 2013-01-11 ENCOUNTER — Telehealth: Payer: Self-pay | Admitting: Oncology

## 2013-01-11 ENCOUNTER — Ambulatory Visit (HOSPITAL_BASED_OUTPATIENT_CLINIC_OR_DEPARTMENT_OTHER): Payer: Medicare Other | Admitting: Lab

## 2013-01-11 ENCOUNTER — Telehealth: Payer: Self-pay

## 2013-01-11 VITALS — BP 155/76 | HR 67 | Temp 97.1°F | Resp 18 | Ht 72.0 in | Wt 222.2 lb

## 2013-01-11 DIAGNOSIS — D696 Thrombocytopenia, unspecified: Secondary | ICD-10-CM

## 2013-01-11 DIAGNOSIS — M545 Low back pain: Secondary | ICD-10-CM

## 2013-01-11 DIAGNOSIS — D638 Anemia in other chronic diseases classified elsewhere: Secondary | ICD-10-CM

## 2013-01-11 DIAGNOSIS — D469 Myelodysplastic syndrome, unspecified: Secondary | ICD-10-CM

## 2013-01-11 DIAGNOSIS — D649 Anemia, unspecified: Secondary | ICD-10-CM

## 2013-01-11 LAB — COMPREHENSIVE METABOLIC PANEL (CC13)
BUN: 15.3 mg/dL (ref 7.0–26.0)
CO2: 25 mEq/L (ref 22–29)
Calcium: 10.2 mg/dL (ref 8.4–10.4)
Chloride: 104 mEq/L (ref 98–109)
Creatinine: 1.2 mg/dL (ref 0.7–1.3)

## 2013-01-11 LAB — CBC WITH DIFFERENTIAL/PLATELET
Basophils Absolute: 0.1 10*3/uL (ref 0.0–0.1)
HCT: 36.1 % — ABNORMAL LOW (ref 38.4–49.9)
HGB: 12 g/dL — ABNORMAL LOW (ref 13.0–17.1)
MONO#: 0.2 10*3/uL (ref 0.1–0.9)
NEUT%: 17.9 % — ABNORMAL LOW (ref 39.0–75.0)
WBC: 9 10*3/uL (ref 4.0–10.3)
lymph#: 0.9 10*3/uL (ref 0.9–3.3)

## 2013-01-11 NOTE — Telephone Encounter (Signed)
gave pt appt for labs every 3 months and see MD on April 2015, sent pt to labs today

## 2013-01-11 NOTE — Telephone Encounter (Signed)
Message copied by Kallie Locks on Mon Jan 11, 2013  2:24 PM ------      Message from: [name]      Created: Mon Jan 11, 2013 12:29 PM       Please call pt and let him know that CBC remains stable. Continue observation with labs in 3 months and 6 months. Visit in 9 months. ------

## 2013-01-11 NOTE — Progress Notes (Signed)
[facility]  [phone] [phone]   OFFICE PROGRESS NOTE   Cc:  Daisy Floro, MD  DIAGNOSIS: DIAGNOSIS:   Myelodysplastic syndrome, IPSS score low risk with mild chronic normocytic anemia and thrombocytopenia.  CURRENT THERAPY: Observation  INTERVAL HISTORY: [name] 79 y.o. male returns for regular follow up with his wife.  He reports feeling stable.  With respect to his MDS, he denied anorexia, weight loss, fever, mucositis, recurrent infection, bleeding.  Patient denies fever, anorexia, weight loss, fatigue, headache, visual changes, confusion, drenching night sweats, palpable lymph node swelling, mucositis, odynophagia, dysphagia, nausea vomiting, jaundice, chest pain, palpitation, shortness of breath, dyspnea on exertion, productive cough, gum bleeding, epistaxis, hematemesis, hemoptysis, abdominal pain, abdominal swelling, early satiety, melena, hematochezia, hematuria, skin rash, spontaneous bleeding, heat or cold intolerance, bowel bladder incontinence, paresthesia, depression, suicidal or homicidal ideation, feeling hopelessness.   Past Medical History  Diagnosis Date  . Diabetes mellitus   . Hypertension     since his Mid -- 20's  . Hyperlipidemia   . Coronary artery disease     CABG x4  -- with Est. EF of 50-60%  --  07/06/2007  . Mild aortic stenosis   . Anemia, chronic disease     Followed by Dr. [name]  . Thrombocytopenia   . Renal insufficiency   . Gout   . MDS (myelodysplastic syndrome), low grade   . Osteoarthritis     Past Surgical History  Procedure Laterality Date  . Cardiac catheterization  06/29/2007     Left heart catheterization with selective coronary angiography, left ventricular angiography -- Probable normal left ventricular function and dilated aortic root -- Extensive and heavy calcification of coronary arteries --  [name]. [name], M.D.       . Total knee arthroplasty  11/25/2008    Right knee -  Severe degenerative arthritis with a varus deformity of the right knee --  [name], M.D.   . Coronary artery bypass graft  07/06/2007     x4 using a left internal mammary  artery graft to the left anterior descending coronary artery with a saphenous vein graft to the diagonal branch to the left anterior descending artery, a saphenous vein graft to the obtuse marginal branch of the left circumflex coronary artery, and a saphenous vein graft to  the posterior descending artery of the right coronary artery -  [name], M.D    Current Outpatient Prescriptions  Medication Sig Dispense Refill  . allopurinol (ZYLOPRIM) 300 MG tablet Take 300 mg by mouth daily.        Marland Kitchen aspirin 325 MG tablet Take 325 mg by mouth daily.        Marland Kitchen atorvastatin (LIPITOR) 20 MG tablet TAKE 1 TABLET (20 MG TOTAL) BY MOUTH DAILY.  30 tablet  6  . CALCIUM PO Take by mouth.       . doxazosin (CARDURA) 4 MG tablet Take 4 mg by mouth at bedtime.        . ferrous gluconate (FERGON) 324 MG tablet Take 1 tablet (324 mg total) by mouth 2 (two) times daily.  60 tablet  6  . furosemide (LASIX) 80 MG tablet Take 80 mg by mouth 2 (two) times daily.        Marland Kitchen GLUCOSAMINE PO Take 2,000 mg by mouth daily.       Marland Kitchen losartan (COZAAR) 100 MG tablet Take 100 mg by mouth daily.        Marland Kitchen  metFORMIN (GLUMETZA) 1000 MG (MOD) 24 hr tablet Take 1,000 mg by mouth daily with breakfast.        . metoprolol (LOPRESSOR) 50 MG tablet Take 1 tablet (50 mg total) by mouth 2 (two) times daily.  60 tablet  3  . Multiple Vitamins-Minerals (MULTIVITAMIN WITH MINERALS) tablet Take 100 tablets by mouth daily.        Marland Kitchen NIFEdipine (ADALAT CC) 90 MG 24 hr tablet Take 90 mg by mouth daily.        . potassium chloride SA (K-DUR,KLOR-CON) 20 MEQ tablet Take 20 mEq by mouth 2 (two) times daily.         No current facility-administered medications for this visit.    ALLERGIES:  has No Known Allergies.  REVIEW OF SYSTEMS:  The rest of the 14-point review of  system was negative.   Filed Vitals:   01/11/13 0846  BP: 155/76  Pulse: 67  Temp: 97.1 F (36.2 C)  Resp: 18   Wt Readings from Last 3 Encounters:  01/11/13 222 lb 3.2 oz (100.789 kg)  04/13/12 224 lb 8 oz (101.833 kg)  10/09/11 226 lb 4.8 oz (102.649 kg)   ECOG Performance status: 1 due to osteoarthritis.   PHYSICAL EXAMINATION:  General:  well-nourished man, in no acute distress.  Eyes:  no scleral icterus.  ENT:  There were no oropharyngeal lesions.  Neck was without thyromegaly.  Lymphatics:  Negative cervical, supraclavicular or axillary adenopathy.  Respiratory: lungs were clear bilaterally without wheezing or crackles.  Cardiovascular:  Regular rate and rhythm, S1/S2, without rub or gallop.  There was a 3/6 systolic murmur best heard in the There was 1+ bilateral pedal edema.  GI:  abdomen was soft, flat, nontender, nondistended, without organomegaly.  Muscoloskeletal:  no spinal tenderness of palpation of vertebral spine.  Skin exam was without echymosis, petichae.  Neuro exam was nonfocal.  Patient was able to get on and off exam table without assistance.  Gait was normal.  Patient was alerted and oriented.  Attention was good.   Language was appropriate.  Mood was normal without depression.  Speech was not pressured.  Thought content was not tangential.      LABORATORY/RADIOLOGY DATA:  Lab Results  Component Value Date   WBC 9.0 01/11/2013   HGB 12.0* 01/11/2013   HCT 36.1* 01/11/2013   PLT 96* 01/11/2013   GLUCOSE 139 01/11/2013   CHOL 78 11/07/2011   TRIG 68.0 11/07/2011   HDL 34.70* 11/07/2011   LDLCALC 30 11/07/2011   ALKPHOS 71 01/11/2013   ALT 9 01/11/2013   AST 17 01/11/2013   NA 142 01/11/2013   K 4.2 01/11/2013   CL 109* 04/13/2012   CREATININE 1.2 01/11/2013   BUN 15.3 01/11/2013   CO2 25 01/11/2013   INR 1.4 12/02/2008   HGBA1C  Value: 5.6 (NOTE)   The ADA recommends the following therapeutic goals for glycemic   control related to Hgb A1C measurement:   Goal of Therapy:    < 7.0% Hgb A1C   Action Suggested:  > 8.0% Hgb A1C   Ref:  Diabetes Care, 22, Suppl. 1, 1999 07/02/2007     ASSESSMENT AND PLAN:   1. Myelodysplastic syndrome:Again, he has stable disease with only mild anemia and thrombocytopenia.  There is no evidence of disease progression of his MDS to higher risk category.  I again recommended watchful observation and repeat bone marrow biopsy in the future if and when he develops significantly worsened cytopenia.  2. Low back pain, most likely secondary to degenerative joint disease. Remains stable.  3. Hypertension. He is on Lasix, losartan, nifedipine, and metoprolol per Cardiology.  4. Hyperlipidemia. He is on atorvastatin.  5. History of gout. He is on allopurinol.  6. Follow up with lab tests in 3, and 6 months at the [facility]. Labs with return visit in about  9months.

## 2013-01-23 ENCOUNTER — Other Ambulatory Visit: Payer: Self-pay | Admitting: Cardiovascular Disease

## 2013-01-30 ENCOUNTER — Other Ambulatory Visit: Payer: Self-pay | Admitting: Cardiovascular Disease

## 2013-03-12 ENCOUNTER — Ambulatory Visit (INDEPENDENT_AMBULATORY_CARE_PROVIDER_SITE_OTHER): Payer: Medicare Other | Admitting: Cardiovascular Disease

## 2013-03-12 ENCOUNTER — Encounter: Payer: Self-pay | Admitting: Cardiovascular Disease

## 2013-03-12 VITALS — BP 151/74 | HR 62 | Ht 73.0 in | Wt 219.0 lb

## 2013-03-12 DIAGNOSIS — I10 Essential (primary) hypertension: Secondary | ICD-10-CM

## 2013-03-12 DIAGNOSIS — I2510 Atherosclerotic heart disease of native coronary artery without angina pectoris: Secondary | ICD-10-CM

## 2013-03-12 NOTE — Assessment & Plan Note (Signed)
His blood pressure remains mildly elevated. I've encouraged him to stay away from salty foods. He still eats a fair amount of   salty foods to

## 2013-03-12 NOTE — Assessment & Plan Note (Signed)
[name] is doing well.  He has not CP.  I have encouraged him to get

## 2013-03-12 NOTE — Progress Notes (Signed)
[name] Date of Birth  23-Feb-1934 [facility]     [place] Office  [address]    [address]   [address] [place], [place]  [address]    [place], [place]  [address] [phone]  Fax  [phone]  [phone]  Fax [phone]  Problem list 1. Aortic stenosis 2. Coronary artery disease-status post CABG 3. History of knee surgery 4. Diabetes mellitus 5. Renal insufficiency 6. Hyperlipidemia  History of Present Illness:  [name] is a 79 year old gentleman with a history of coronary artery disease and aortic stenosis. He was a previous patient of Dr. [name]  He is done well. He's not having episodes of chest pain or shortness breath. He's able to do all of his normal activities without any significant problems.  Sept. 12, 2014: [name] is doing well.  He has not had any CP or dyspnea.   He does not exercise.    Current Outpatient Prescriptions on File Prior to Visit  Medication Sig Dispense Refill  . allopurinol (ZYLOPRIM) 300 MG tablet Take 300 mg by mouth daily.        Marland Kitchen aspirin 325 MG tablet Take 325 mg by mouth daily.        Marland Kitchen atorvastatin (LIPITOR) 20 MG tablet TAKE 1 TABLET (20 MG TOTAL) BY MOUTH DAILY.  30 tablet  6  . CALCIUM PO Take by mouth.       . doxazosin (CARDURA) 4 MG tablet Take 4 mg by mouth at bedtime.        . ferrous gluconate (FERGON) 324 MG tablet Take 1 tablet (324 mg total) by mouth 2 (two) times daily.  60 tablet  6  . furosemide (LASIX) 80 MG tablet Take 80 mg by mouth 2 (two) times daily.        Marland Kitchen GLUCOSAMINE PO Take 2,000 mg by mouth daily.       Marland Kitchen losartan (COZAAR) 100 MG tablet Take 100 mg by mouth daily.        . metFORMIN (GLUMETZA) 1000 MG (MOD) 24 hr tablet Take 1,000 mg by mouth daily with breakfast.        . metoprolol (LOPRESSOR) 50 MG tablet TAKE 1 TABLET (50 MG TOTAL) BY MOUTH 2 (TWO) TIMES DAILY.  60 tablet  3  . Multiple Vitamins-Minerals (MULTIVITAMIN WITH MINERALS) tablet Take 100 tablets by mouth daily.         Marland Kitchen NIFEdipine (ADALAT CC) 90 MG 24 hr tablet Take 90 mg by mouth daily.        . potassium chloride SA (K-DUR,KLOR-CON) 20 MEQ tablet Take 20 mEq by mouth 2 (two) times daily.         No current facility-administered medications on file prior to visit.    No Known Allergies  Past Medical History  Diagnosis Date  . Diabetes mellitus   . Hypertension     since his Mid -- 20's  . Hyperlipidemia   . Coronary artery disease     CABG x4  -- with Est. EF of 50-60%  --  07/06/2007  . Mild aortic stenosis   . Anemia, chronic disease     Followed by Dr. [name]  . Thrombocytopenia   . Renal insufficiency   . Gout   . MDS (myelodysplastic syndrome), low grade   . Osteoarthritis     Past Surgical History  Procedure Laterality Date  . Cardiac catheterization  06/29/2007     Left heart catheterization with selective coronary angiography,  left ventricular angiography -- Probable normal left ventricular function and dilated aortic root -- Extensive and heavy calcification of coronary arteries --  [name]. [name], M.D.       . Total knee arthroplasty  11/25/2008    Right knee - Severe degenerative arthritis with a varus deformity of the right knee --  [name], M.D.   . Coronary artery bypass graft  07/06/2007     x4 using a left internal mammary  artery graft to the left anterior descending coronary artery with a saphenous vein graft to the diagonal branch to the left anterior descending artery, a saphenous vein graft to the obtuse marginal branch of the left circumflex coronary artery, and a saphenous vein graft to  the posterior descending artery of the right coronary artery -  [name], M.D    History  Smoking status  . Former Smoker  . Quit date: 07/01/1980  Smokeless tobacco  . Not on file    History  Alcohol Use No    No family history on file.  Reviw of Systems:  Reviewed in the HPI.  All other systems are negative.  Physical Exam: Blood pressure 151/74,  pulse 62, height 6\' 1"  (1.854 m), weight 219 lb (99.338 kg). General: Well developed, well nourished, in no acute distress.  Head: Normocephalic, atraumatic, sclera non-icteric, mucus membranes are moist,   Neck: Supple. Negative for carotid bruits. JVD not elevated.  Lungs: Clear bilaterally to auscultation without wheezes, rales, or rhonchi. Breathing is unlabored.  Heart: RRR with a 1-2 systolic murmur  Abdomen: Soft, non-tender, non-distended with normoactive bowel sounds. No hepatomegaly. No rebound/guarding. No obvious abdominal masses.  Msk:  Strength and tone appear normal for age.  Extremities: No clubbing or cyanosis. No edema.  Distal pedal pulses are 2+ and equal bilaterally.  Neuro: Alert and oriented X 3. Moves all extremities spontaneously.  Psych:  Responds to questions appropriately with a normal affect.  ECG: Sept. 12, 2014:  Sinus brady at 75  with first-degree AV block. Otherwise the EKG is unremarkable.  Assessment / Plan:

## 2013-03-12 NOTE — Patient Instructions (Addendum)
Your physician wants you to follow-up in:  6 MONTHS You will receive a reminder letter in the mail two months in advance. If you don't receive a letter, please call our office to schedule the follow-up appointment.  Your physician recommends that you return for a FASTING lipid profile: 6 MONTHS   REDUCE HIGH SODIUM FOODS LIKE CANNED SOUP, GRAVY, SAUCES, READY PREPARED FOODS LIKE FROZEN FOODS; LEAN CUISINE, LASAGNA. BACON, SAUSAGE, LUNCH MEAT, FAST FOODS, HOT DOGS, CHIPS, PIZZA, CHINESE FOOD, SOY SAUCE, STORE BOUGHT FRIED CHICKEN= [place] FRIED CHICKEN/ BO [employer]ANGLES.

## 2013-03-25 ENCOUNTER — Encounter: Payer: Self-pay | Admitting: Cardiology

## 2013-04-14 ENCOUNTER — Other Ambulatory Visit: Payer: Medicare Other

## 2013-05-04 ENCOUNTER — Other Ambulatory Visit: Payer: Self-pay | Admitting: Oncology

## 2013-07-14 ENCOUNTER — Other Ambulatory Visit: Payer: Self-pay | Admitting: *Deleted

## 2013-07-14 ENCOUNTER — Other Ambulatory Visit: Payer: Medicare Other

## 2013-07-16 ENCOUNTER — Telehealth: Payer: Self-pay | Admitting: *Deleted

## 2013-07-16 NOTE — Telephone Encounter (Signed)
sw pt gv appt for labs 07/21/13 @ 11:45am. Pt is aware...td

## 2013-07-21 ENCOUNTER — Other Ambulatory Visit (HOSPITAL_BASED_OUTPATIENT_CLINIC_OR_DEPARTMENT_OTHER): Payer: BC Managed Care – PPO

## 2013-07-21 DIAGNOSIS — D469 Myelodysplastic syndrome, unspecified: Secondary | ICD-10-CM

## 2013-07-21 DIAGNOSIS — D696 Thrombocytopenia, unspecified: Secondary | ICD-10-CM

## 2013-07-21 DIAGNOSIS — D638 Anemia in other chronic diseases classified elsewhere: Secondary | ICD-10-CM

## 2013-07-21 LAB — CBC WITH DIFFERENTIAL/PLATELET
BASO%: 0.1 % (ref 0.0–2.0)
Basophils Absolute: 0 10*3/uL (ref 0.0–0.1)
EOS%: 70.6 % — AB (ref 0.0–7.0)
Eosinophils Absolute: 5.1 10*3/uL — ABNORMAL HIGH (ref 0.0–0.5)
HEMATOCRIT: 35.3 % — AB (ref 38.4–49.9)
HGB: 11.7 g/dL — ABNORMAL LOW (ref 13.0–17.1)
LYMPH%: 10 % — AB (ref 14.0–49.0)
MCH: 27.5 pg (ref 27.2–33.4)
MCHC: 33.1 g/dL (ref 32.0–36.0)
MCV: 82.9 fL (ref 79.3–98.0)
MONO#: 0.2 10*3/uL (ref 0.1–0.9)
MONO%: 2.4 % (ref 0.0–14.0)
NEUT#: 1.2 10*3/uL — ABNORMAL LOW (ref 1.5–6.5)
NEUT%: 16.9 % — AB (ref 39.0–75.0)
NRBC: 0 % (ref 0–0)
PLATELETS: 62 10*3/uL — AB (ref 140–400)
RBC: 4.26 10*6/uL (ref 4.20–5.82)
RDW: 18.6 % — ABNORMAL HIGH (ref 11.0–14.6)
WBC: 7.2 10*3/uL (ref 4.0–10.3)
lymph#: 0.7 10*3/uL — ABNORMAL LOW (ref 0.9–3.3)

## 2013-10-12 ENCOUNTER — Other Ambulatory Visit: Payer: Self-pay | Admitting: Hematology and Oncology

## 2013-10-12 ENCOUNTER — Telehealth: Payer: Self-pay | Admitting: Hematology and Oncology

## 2013-10-12 ENCOUNTER — Encounter: Payer: Self-pay | Admitting: *Deleted

## 2013-10-12 ENCOUNTER — Ambulatory Visit (HOSPITAL_BASED_OUTPATIENT_CLINIC_OR_DEPARTMENT_OTHER): Payer: Medicare Other | Admitting: Hematology and Oncology

## 2013-10-12 ENCOUNTER — Encounter: Payer: Self-pay | Admitting: Hematology and Oncology

## 2013-10-12 ENCOUNTER — Other Ambulatory Visit (HOSPITAL_BASED_OUTPATIENT_CLINIC_OR_DEPARTMENT_OTHER): Payer: Medicare Other

## 2013-10-12 VITALS — BP 140/70 | HR 64 | Temp 98.0°F | Resp 18 | Ht 73.0 in | Wt 223.9 lb

## 2013-10-12 DIAGNOSIS — D696 Thrombocytopenia, unspecified: Secondary | ICD-10-CM

## 2013-10-12 DIAGNOSIS — D539 Nutritional anemia, unspecified: Secondary | ICD-10-CM

## 2013-10-12 DIAGNOSIS — D4620 Refractory anemia with excess of blasts, unspecified: Secondary | ICD-10-CM

## 2013-10-12 DIAGNOSIS — D721 Eosinophilia, unspecified: Secondary | ICD-10-CM

## 2013-10-12 DIAGNOSIS — D638 Anemia in other chronic diseases classified elsewhere: Secondary | ICD-10-CM

## 2013-10-12 DIAGNOSIS — D469 Myelodysplastic syndrome, unspecified: Secondary | ICD-10-CM

## 2013-10-12 DIAGNOSIS — D649 Anemia, unspecified: Secondary | ICD-10-CM

## 2013-10-12 DIAGNOSIS — D46Z Other myelodysplastic syndromes: Secondary | ICD-10-CM

## 2013-10-12 LAB — CBC WITH DIFFERENTIAL/PLATELET
BASO%: 0.2 % (ref 0.0–2.0)
BASOS ABS: 0 10*3/uL (ref 0.0–0.1)
EOS%: 67.2 % — AB (ref 0.0–7.0)
Eosinophils Absolute: 4.2 10*3/uL — ABNORMAL HIGH (ref 0.0–0.5)
HCT: 34.2 % — ABNORMAL LOW (ref 38.4–49.9)
HGB: 11.3 g/dL — ABNORMAL LOW (ref 13.0–17.1)
LYMPH#: 0.8 10*3/uL — AB (ref 0.9–3.3)
LYMPH%: 12.5 % — ABNORMAL LOW (ref 14.0–49.0)
MCH: 27.6 pg (ref 27.2–33.4)
MCHC: 33 g/dL (ref 32.0–36.0)
MCV: 83.4 fL (ref 79.3–98.0)
MONO#: 0.2 10*3/uL (ref 0.1–0.9)
MONO%: 3 % (ref 0.0–14.0)
NEUT#: 1.1 10*3/uL — ABNORMAL LOW (ref 1.5–6.5)
NEUT%: 17.1 % — ABNORMAL LOW (ref 39.0–75.0)
Platelets: 54 10*3/uL — ABNORMAL LOW (ref 140–400)
RBC: 4.1 10*6/uL — AB (ref 4.20–5.82)
RDW: 18.2 % — AB (ref 11.0–14.6)
WBC: 6.3 10*3/uL (ref 4.0–10.3)
nRBC: 0 % (ref 0–0)

## 2013-10-12 LAB — TECHNOLOGIST REVIEW

## 2013-10-12 NOTE — Progress Notes (Signed)
Notified Butch Penny in [employer] of [facility] at [facility] on 4/16 at 8:30 am.

## 2013-10-12 NOTE — Progress Notes (Signed)
[place] progress notes  Patient Care Team: Melinda Crutch, MD as PCP - General (Family Medicine)  CHIEF COMPLAINTS/PURPOSE OF VISIT:  Myelodysplastic syndrome, IPSS score low risk with anemia and thrombocytopenia  HISTORY OF PRESENTING ILLNESS:  [name] 80 y.o. male was transferred to my care after his prior physician has left.  I reviewed his records and collaborated the history with the patient. He was noted to have mild plasmacytosis, eosinophilia and anemia. On March 2010, he had bone marrow aspirate and biopsy which showed dyspoietic changes less than 1% blasts. 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 The patient bruises easily. The patient denies any recent signs or symptoms of bleeding such as spontaneous epistaxis, hematuria or hematochezia. He has low energy level. MEDICAL HISTORY:  Past Medical History  Diagnosis Date  . Diabetes mellitus   . Hypertension     since his Mid -- 20's  . Hyperlipidemia   . Coronary artery disease     CABG x4  -- with Est. EF of 50-60%  --  07/06/2007  . Mild aortic stenosis   . Anemia, chronic disease     Followed by Dr. Lamonte Sakai  . Thrombocytopenia   . Renal insufficiency   . Gout   . MDS (myelodysplastic syndrome), low grade   . Osteoarthritis     SURGICAL HISTORY: Past Surgical History  Procedure Laterality Date  . Cardiac catheterization  06/29/2007     Left heart catheterization with selective coronary angiography, left ventricular angiography -- Probable normal left ventricular function and dilated aortic root -- Extensive and heavy calcification of coronary arteries --  Ludwig Lean. Doreatha Lew, M.D.       . Total knee arthroplasty  11/25/2008    Right knee - Severe degenerative arthritis with a varus deformity of the right knee --  Kipp Brood. [name], M.D.   . Coronary artery bypass graft  07/06/2007     x4 using a left internal mammary  artery graft to the  left anterior descending coronary artery with a saphenous vein graft to the diagonal branch to the left anterior descending artery, a saphenous vein graft to the obtuse marginal branch of the left circumflex coronary artery, and a saphenous vein graft to  the posterior descending artery of the right coronary artery -  Gilford Raid, M.D    SOCIAL HISTORY: History   Social History  . Marital Status: Married    Spouse Name: N/A    Number of Children: N/A  . Years of Education: N/A   Occupational History  . Not on file.   Social History Main Topics  . Smoking status: Former Smoker    Quit date: 07/01/1980  . Smokeless tobacco: Never Used  . Alcohol Use: No  . Drug Use: No  . Sexual Activity: No   Other Topics Concern  . Not on file   Social History Narrative  . No narrative on file    FAMILY HISTORY: Family History  Problem Relation Age of Onset  . Cancer Daughter     colon ca    ALLERGIES:  has No Known Allergies.  MEDICATIONS:  Current Outpatient Prescriptions  Medication Sig Dispense Refill  . allopurinol (ZYLOPRIM) 300 MG tablet Take 300 mg by mouth daily.        Marland Kitchen aspirin 325 MG tablet Take 325 mg by mouth daily.        Marland Kitchen atorvastatin (LIPITOR) 20 MG tablet TAKE 1 TABLET (20 MG TOTAL) BY MOUTH  DAILY.  30 tablet  6  . CALCIUM PO Take by mouth.       . doxazosin (CARDURA) 4 MG tablet Take 4 mg by mouth at bedtime.        . ferrous gluconate (FERGON) 324 MG tablet TAKE 1 TABLET BY MOUTH TWICE A DAY  60 tablet  5  . furosemide (LASIX) 80 MG tablet Take 80 mg by mouth 2 (two) times daily.        Marland Kitchen GLUCOSAMINE PO Take 2,000 mg by mouth daily.       Marland Kitchen losartan (COZAAR) 100 MG tablet Take 100 mg by mouth daily.        . metFORMIN (GLUMETZA) 1000 MG (MOD) 24 hr tablet Take 1,000 mg by mouth daily with breakfast.        . metoprolol (LOPRESSOR) 50 MG tablet TAKE 1 TABLET (50 MG TOTAL) BY MOUTH 2 (TWO) TIMES DAILY.  60 tablet  3  . Multiple Vitamins-Minerals (MULTIVITAMIN  WITH MINERALS) tablet Take 100 tablets by mouth daily.        Marland Kitchen NIFEdipine (ADALAT CC) 90 MG 24 hr tablet Take 90 mg by mouth daily.        . potassium chloride SA (K-DUR,KLOR-CON) 20 MEQ tablet Take 20 mEq by mouth 2 (two) times daily.         No current facility-administered medications for this visit.    REVIEW OF SYSTEMS:   Constitutional: Denies fevers, chills or abnormal night sweats Eyes: Denies blurriness of vision, double vision or watery eyes Ears, nose, mouth, throat, and face: Denies mucositis or sore throat Respiratory: Denies cough, dyspnea or wheezes Cardiovascular: Denies palpitation, chest discomfort or lower extremity swelling Gastrointestinal:  Denies nausea, heartburn or change in bowel habits Skin: Denies abnormal skin rashes Lymphatics: Denies new lymphadenopathy  Neurological:Denies numbness, tingling or new weaknesses Behavioral/Psych: Mood is stable, no new changes  All other systems were reviewed with the patient and are negative.  PHYSICAL EXAMINATION: ECOG PERFORMANCE STATUS: 1 - Symptomatic but completely ambulatory  Filed Vitals:   10/12/13 0831  BP: 140/70  Pulse: 64  Temp: 98 F (36.7 C)  Resp: 18   Filed Weights   10/12/13 0831  Weight: 223 lb 14.4 oz (101.56 kg)    GENERAL:alert, no distress and comfortable SKIN: Noted thin skin and some old bruises EYES: normal, conjunctiva are pink and non-injected, sclera clear OROPHARYNX:no exudate, normal lips, buccal mucosa, and tongue  NECK: supple, thyroid normal size, non-tender, without nodularity LYMPH:  no palpable lymphadenopathy in the cervical, axillary or inguinal LUNGS: clear to auscultation and percussion with normal breathing effort HEART: regular rate & rhythm and no murmurs without lower extremity edema ABDOMEN:abdomen soft, non-tender and normal bowel sounds. No splenomegaly Musculoskeletal:no cyanosis of digits and no clubbing  PSYCH: alert & oriented x 3 with fluent speech NEURO:  no focal motor/sensory deficits  LABORATORY DATA:  I have reviewed the data as listed Lab Results  Component Value Date   WBC 6.3 10/12/2013   HGB 11.3* 10/12/2013   HCT 34.2* 10/12/2013   MCV 83.4 10/12/2013   PLT 54* 10/12/2013    Recent Labs  01/11/13 0921  NA 142  K 4.2  CO2 25  GLUCOSE 139  BUN 15.3  CREATININE 1.2  CALCIUM 10.2  PROT 8.2  ALBUMIN 4.0  AST 17  ALT 9  ALKPHOS 71  BILITOT 0.61    ASSESSMENT & PLAN:  #1 anemia #2 eosinophilia #3 progressive thrombocytopenia I am concerned the  patient is progressing into acute myelogenous leukemia. I recommend repeat bone marrow aspirate and biopsy and to rule out myeloproliferative disorder/MDS such as hypereosinophilic syndrome as a potential cause for his abnormal blood counts. With his platelet count now close to 50,000, his risk of excessive bleeding in the near future is high I discussed with him the risks, benefits and side effects of a bone marrow aspirate and biopsy and he agreed to proceed. I told the patient discontinue his oral iron supplements, pending further test results. Orders Placed This Encounter  Procedures  . CBC with Differential    Standing Status: Future     Number of Occurrences:      Standing Expiration Date: 10/12/2014  . Vitamin B12    Standing Status: Future     Number of Occurrences:      Standing Expiration Date: 10/12/2014  . Iron and TIBC    Standing Status: Future     Number of Occurrences:      Standing Expiration Date: 10/12/2014  . Ferritin    Standing Status: Future     Number of Occurrences:      Standing Expiration Date: 10/12/2014  . Erythropoietin    Standing Status: Future     Number of Occurrences:      Standing Expiration Date: 10/12/2014  . JAK2 genotypr    Standing Status: Future     Number of Occurrences:      Standing Expiration Date: 10/12/2014    All questions were answered. The patient knows to call the clinic with any problems, questions or concerns. I  spent 40 minutes counseling the patient face to face. The total time spent in the appointment was 60 minutes and more than 50% was on counseling.     Heath Lark, MD 10/12/2013 9:40 PM

## 2013-10-12 NOTE — Telephone Encounter (Signed)
gv and pritned appt sched and avs for pt for April

## 2013-10-13 ENCOUNTER — Encounter: Payer: Self-pay | Admitting: Cardiovascular Disease

## 2013-10-13 ENCOUNTER — Ambulatory Visit (INDEPENDENT_AMBULATORY_CARE_PROVIDER_SITE_OTHER): Payer: Medicare Other | Admitting: Cardiovascular Disease

## 2013-10-13 ENCOUNTER — Ambulatory Visit (INDEPENDENT_AMBULATORY_CARE_PROVIDER_SITE_OTHER): Payer: Medicare Other | Admitting: *Deleted

## 2013-10-13 VITALS — BP 160/82 | HR 65 | Ht 73.0 in | Wt 221.1 lb

## 2013-10-13 DIAGNOSIS — I10 Essential (primary) hypertension: Secondary | ICD-10-CM

## 2013-10-13 DIAGNOSIS — I359 Nonrheumatic aortic valve disorder, unspecified: Secondary | ICD-10-CM

## 2013-10-13 DIAGNOSIS — E785 Hyperlipidemia, unspecified: Secondary | ICD-10-CM

## 2013-10-13 DIAGNOSIS — I2510 Atherosclerotic heart disease of native coronary artery without angina pectoris: Secondary | ICD-10-CM

## 2013-10-13 DIAGNOSIS — I350 Nonrheumatic aortic (valve) stenosis: Secondary | ICD-10-CM

## 2013-10-13 LAB — HEPATIC FUNCTION PANEL
ALT: 17 U/L (ref 0–53)
AST: 23 U/L (ref 0–37)
Albumin: 4 g/dL (ref 3.5–5.2)
Alkaline Phosphatase: 56 U/L (ref 39–117)
BILIRUBIN DIRECT: 0.1 mg/dL (ref 0.0–0.3)
Total Bilirubin: 0.7 mg/dL (ref 0.3–1.2)
Total Protein: 7.7 g/dL (ref 6.0–8.3)

## 2013-10-13 LAB — CBC
HCT: 35.1 % — ABNORMAL LOW (ref 39.0–52.0)
Hemoglobin: 11.6 g/dL — ABNORMAL LOW (ref 13.0–17.0)
MCHC: 33.1 g/dL (ref 30.0–36.0)
MCV: 85 fl (ref 78.0–100.0)
PLATELETS: 80 10*3/uL — AB (ref 150.0–400.0)
RBC: 4.12 Mil/uL — AB (ref 4.22–5.81)
RDW: 20.2 % — ABNORMAL HIGH (ref 11.5–14.6)
WBC: 6 10*3/uL (ref 4.5–10.5)

## 2013-10-13 LAB — LIPID PANEL
Cholesterol: 93 mg/dL (ref 0–200)
HDL: 34.4 mg/dL — ABNORMAL LOW (ref >39.00)
LDL CALC: 44 mg/dL (ref 0–99)
Total CHOL/HDL Ratio: 3
Triglycerides: 75 mg/dL (ref 0.0–149.0)
VLDL: 15 mg/dL (ref 0.0–40.0)

## 2013-10-13 LAB — BASIC METABOLIC PANEL
BUN: 14 mg/dL (ref 6–23)
CHLORIDE: 105 meq/L (ref 96–112)
CO2: 28 mEq/L (ref 19–32)
CREATININE: 1.1 mg/dL (ref 0.4–1.5)
Calcium: 9.7 mg/dL (ref 8.4–10.5)
GFR: 85.15 mL/min (ref >60.00)
Glucose, Bld: 137 mg/dL — ABNORMAL HIGH (ref 70–99)
Potassium: 3.7 mEq/L (ref 3.5–5.1)
Sodium: 140 mEq/L (ref 135–145)

## 2013-10-13 MED ORDER — CARVEDILOL 25 MG PO TABS: 25.0000 mg | ORAL_TABLET | Freq: Two times a day (BID) | ORAL | Status: DC

## 2013-10-13 MED ORDER — ASPIRIN 81 MG PO TABS: 81.0000 mg | ORAL_TABLET | Freq: Every day | ORAL | Status: DC

## 2013-10-13 NOTE — Progress Notes (Signed)
[name] Date of Birth  July 11, 1933 [name] [address]    [place]   [place], [place]  [address]    [place],   [address] [phone]  Fax  [phone]  [phone]  Fax [phone]  Problem list 1. Aortic stenosis 2. Coronary artery disease-status post CABG 3. History of knee surgery 4. Diabetes mellitus 5. Renal insufficiency 6. Hyperlipidemia  History of Present Illness:  [name] is a 80 year old gentleman with a history of coronary artery disease and aortic stenosis. He was a previous patient of Dr. Doreatha Lew  He is done well. He's not having episodes of chest pain or shortness breath. He's able to do all of his normal activities without any significant problems.  Sept. 12, 2014: [name] is doing well.  He has not had any CP or dyspnea.   He does not exercise.    October 13, 2013:  [name] is doing well.  His BP is much better at home.  He needs to exercise more.    Current Outpatient Prescriptions on File Prior to Visit  Medication Sig Dispense Refill  . allopurinol (ZYLOPRIM) 300 MG tablet Take 300 mg by mouth daily.        Marland Kitchen aspirin 325 MG tablet Take 325 mg by mouth daily.        Marland Kitchen atorvastatin (LIPITOR) 20 MG tablet TAKE 1 TABLET (20 MG TOTAL) BY MOUTH DAILY.  30 tablet  6  . CALCIUM PO Take by mouth.       . doxazosin (CARDURA) 4 MG tablet Take 4 mg by mouth at bedtime.        . ferrous gluconate (FERGON) 324 MG tablet TAKE 1 TABLET BY MOUTH TWICE A DAY  60 tablet  5  . furosemide (LASIX) 80 MG tablet Take 80 mg by mouth 2 (two) times daily.        Marland Kitchen GLUCOSAMINE PO Take 2,000 mg by mouth daily.       Marland Kitchen losartan (COZAAR) 100 MG tablet Take 100 mg by mouth daily.        . metFORMIN (GLUMETZA) 1000 MG (MOD) 24 hr tablet Take 1,000 mg by mouth daily with breakfast.        . metoprolol (LOPRESSOR) 50 MG tablet TAKE 1 TABLET (50 MG TOTAL) BY MOUTH 2 (TWO) TIMES DAILY.  60 tablet  3  . Multiple  Vitamins-Minerals (MULTIVITAMIN WITH MINERALS) tablet Take 100 tablets by mouth daily.        Marland Kitchen NIFEdipine (ADALAT CC) 90 MG 24 hr tablet Take 90 mg by mouth daily.        . potassium chloride SA (K-DUR,KLOR-CON) 20 MEQ tablet Take 20 mEq by mouth 2 (two) times daily.         No current facility-administered medications on file prior to visit.    No Known Allergies  Past Medical History  Diagnosis Date  . Diabetes mellitus   . Hypertension     since his Mid -- 20's  . Hyperlipidemia   . Coronary artery disease     CABG x4  -- with Est. EF of 50-60%  --  07/06/2007  . Mild aortic stenosis   . Anemia, chronic disease     Followed by Dr. Lamonte Sakai  . Thrombocytopenia   . Renal insufficiency   . Gout   . MDS (myelodysplastic syndrome), low grade   . Osteoarthritis     Past Surgical History  Procedure Laterality Date  . Cardiac catheterization  06/29/2007     Left heart catheterization with selective coronary angiography, left ventricular angiography -- Probable normal left ventricular function and dilated aortic root -- Extensive and heavy calcification of coronary arteries --  Ludwig Lean. Doreatha Lew, M.D.       . Total knee arthroplasty  11/25/2008    Right knee - Severe degenerative arthritis with a varus deformity of the right knee --  Kipp Brood. [name], M.D.   . Coronary artery bypass graft  07/06/2007     x4 using a left internal mammary  artery graft to the left anterior descending coronary artery with a saphenous vein graft to the diagonal branch to the left anterior descending artery, a saphenous vein graft to the obtuse marginal branch of the left circumflex coronary artery, and a saphenous vein graft to  the posterior descending artery of the right coronary artery -  Gilford Raid, M.D    History  Smoking status  . Former Smoker  . Quit date: 07/01/1980  Smokeless tobacco  . Never Used    History  Alcohol Use No    Family History  Problem Relation Age of Onset  . Cancer  Daughter     colon ca    Reviw of Systems:  Reviewed in the HPI.  All other systems are negative.  Physical Exam: Blood pressure 160/82, pulse 65, height 6\' 1"  (1.854 m), weight 221 lb 1.9 oz (100.299 kg). General: Well developed, well nourished, in no acute distress.  Head: Normocephalic, atraumatic, sclera non-icteric, mucus membranes are moist,   Neck: Supple. Negative for carotid bruits. JVD not elevated.  Lungs: Clear bilaterally to auscultation without wheezes, rales, or rhonchi. Breathing is unlabored.  Heart: RRR with a 1-2 systolic murmur  Abdomen: Soft, non-tender, non-distended with normoactive bowel sounds. No hepatomegaly. No rebound/guarding. No obvious abdominal masses.  Msk:  Strength and tone appear normal for age.  Extremities: No clubbing or cyanosis. No edema.  Distal pedal pulses are 2+ and equal bilaterally.  Neuro: Alert and oriented X 3. Moves all extremities spontaneously.  Psych:  Responds to questions appropriately with a normal affect.  ECG: Sept. 12, 2014:  Sinus brady at 8  with first-degree AV block. Otherwise the EKG is unremarkable.  Assessment / Plan:

## 2013-10-13 NOTE — Assessment & Plan Note (Signed)
His lipids were checked today. Will check him again in 6 months.

## 2013-10-13 NOTE — Assessment & Plan Note (Signed)
[name] is doing well.  No CP or dyspnea.  Lipids have been checked today.  Have encouraged him to exercise more.

## 2013-10-13 NOTE — Patient Instructions (Addendum)
Your physician has recommended you make the following change in your medication:  DECREASE Aspirin to 81 mg once daily STOP Metoprolol (Lopressor)  START Coreg 25 mg twice daily  Your physician recommends that you return for lab work in: 6 months before you see Dr. Acie Fredrickson.  You will need to fast for this appointment (nothing to eat or drink except water after midnight the night before)  Your physician wants you to follow-up in: 6 months with Dr. Vilinda Boehringer will receive a reminder letter in the mail two months in advance. If you don't receive a letter, please call our office to schedule the follow-up appointment.

## 2013-10-13 NOTE — Assessment & Plan Note (Signed)
BP is still mildly elevated. We'll discontinue the metoprolol and try him on carvedilol 25 mg twice a day. I've encouraged him to exercise a little bit more.

## 2013-10-14 ENCOUNTER — Other Ambulatory Visit (HOSPITAL_COMMUNITY)
Admission: RE | Admit: 2013-10-14 | Discharge: 2013-10-14 | Disposition: A | Discharge status: Home / Self Care | Payer: Medicare Other | Source: Ambulatory Visit | Attending: Hematology and Oncology | Admitting: Hematology and Oncology

## 2013-10-14 ENCOUNTER — Other Ambulatory Visit (HOSPITAL_BASED_OUTPATIENT_CLINIC_OR_DEPARTMENT_OTHER): Payer: Medicare Other

## 2013-10-14 ENCOUNTER — Ambulatory Visit: Payer: Medicare Other | Admitting: Hematology and Oncology

## 2013-10-14 VITALS — BP 133/63 | HR 63 | Temp 97.7°F | Resp 18

## 2013-10-14 DIAGNOSIS — D721 Eosinophilia, unspecified: Secondary | ICD-10-CM | POA: Insufficient documentation

## 2013-10-14 DIAGNOSIS — D696 Thrombocytopenia, unspecified: Secondary | ICD-10-CM | POA: Insufficient documentation

## 2013-10-14 DIAGNOSIS — D4620 Refractory anemia with excess of blasts, unspecified: Secondary | ICD-10-CM

## 2013-10-14 DIAGNOSIS — D709 Neutropenia, unspecified: Secondary | ICD-10-CM | POA: Insufficient documentation

## 2013-10-14 DIAGNOSIS — D649 Anemia, unspecified: Secondary | ICD-10-CM

## 2013-10-14 DIAGNOSIS — D539 Nutritional anemia, unspecified: Secondary | ICD-10-CM

## 2013-10-14 LAB — CBC WITH DIFFERENTIAL/PLATELET
BASO%: 1.1 % (ref 0.0–2.0)
BASOS ABS: 0.1 10*3/uL (ref 0.0–0.1)
EOS%: 63.7 % — ABNORMAL HIGH (ref 0.0–7.0)
Eosinophils Absolute: 3.6 10*3/uL — ABNORMAL HIGH (ref 0.0–0.5)
HEMATOCRIT: 36.3 % — AB (ref 38.4–49.9)
HEMOGLOBIN: 11.9 g/dL — AB (ref 13.0–17.1)
LYMPH%: 11.1 % — AB (ref 14.0–49.0)
MCH: 27.9 pg (ref 27.2–33.4)
MCHC: 32.8 g/dL (ref 32.0–36.0)
MCV: 85.2 fL (ref 79.3–98.0)
MONO#: 0.1 10*3/uL (ref 0.1–0.9)
MONO%: 1.9 % (ref 0.0–14.0)
NEUT%: 22.2 % — ABNORMAL LOW (ref 39.0–75.0)
NEUTROS ABS: 1.2 10*3/uL — AB (ref 1.5–6.5)
PLATELETS: 64 10*3/uL — AB (ref 140–400)
RBC: 4.26 10*6/uL (ref 4.20–5.82)
RDW: 20 % — ABNORMAL HIGH (ref 11.0–14.6)
WBC: 5.6 10*3/uL (ref 4.0–10.3)
lymph#: 0.6 10*3/uL — ABNORMAL LOW (ref 0.9–3.3)

## 2013-10-14 LAB — FERRITIN CHCC: FERRITIN: 121 ng/mL (ref 22–316)

## 2013-10-14 LAB — IRON AND TIBC CHCC
%SAT: 31 % (ref 20–55)
Iron: 83 ug/dL (ref 42–163)
TIBC: 268 ug/dL (ref 202–409)
UIBC: 185 ug/dL (ref 117–376)

## 2013-10-14 LAB — BONE MARROW EXAM: Bone Marrow Exam: 271

## 2013-10-14 NOTE — Progress Notes (Signed)
See procedure note.

## 2013-10-14 NOTE — Patient Instructions (Addendum)
Bone Marrow Aspiration, Bone Marrow Biopsy  Care After  Read the instructions outlined below and refer to this sheet in the next few weeks. These discharge instructions provide you with general information on caring for yourself after you leave the hospital. Your caregiver may also give you specific instructions. While your treatment has been planned according to the most current medical practices available, unavoidable complications occasionally occur. If you have any problems or questions after discharge, call your caregiver.  FINDING OUT THE RESULTS OF YOUR TEST  Not all test results are available during your visit. If your test results are not back during the visit, make an appointment with your caregiver to find out the results. Do not assume everything is normal if you have not heard from your caregiver or the medical facility. It is important for you to follow up on all of your test results.   HOME CARE INSTRUCTIONS   You have had sedation and may be sleepy or dizzy. Your thinking may not be as clear as usual. For the next 24 hours:   Only take over-the-counter or prescription medicines for pain, discomfort, and or fever as directed by your caregiver.   Do not drink alcohol.   Do not smoke.   Do not drive.   Do not make important legal decisions.   Do not operate heavy machinery.   Do not care for small children by yourself.   Keep your dressing clean and dry. You may replace dressing with a bandage after 24 hours.   You may take a bath or shower after 24 hours.   Use an ice pack for 20 minutes every 2 hours while awake for pain as needed.  SEEK MEDICAL CARE IF:    There is redness, swelling, or increasing pain at the biopsy site.   There is pus coming from the biopsy site.   There is drainage from a biopsy site lasting longer than one day.   An unexplained oral temperature above 102 F (38.9 C) develops.  SEEK IMMEDIATE MEDICAL CARE IF:    You develop a rash.   You have difficulty  breathing.   You develop any reaction or side effects to medications given.  Document Released: 01/04/2005 Document Revised: 09/09/2011 Document Reviewed: 06/14/2008  ExitCare Patient Information 2014 [employer].

## 2013-10-14 NOTE — Progress Notes (Signed)
0930- Patient layed flat on his back for 45 minutues after procedure. Dressing on his lower back noted to be clean dry and intact. Patient dressed and discharged home. Patient instructed to take Tylenol as needed for discomfort and call for any concerns. Patient verbalized understanding.

## 2013-10-14 NOTE — Procedures (Signed)
Bone Marrow Biopsy and Aspiration Procedure Note   Informed consent was obtained and potential risks including bleeding, infection and pain were reviewed with the patient. I verified that the patient has been fasting since midnight.  The patient's name, date of birth, identification, consent and allergies were verified prior to the start of procedure and time out was performed. The right posterior iliac crest was chosen as the site of biopsy.  The skin was prepped with Betadine solution.   8 cc of 1% lidocaine was used to provide local anaesthesia.   10 cc of bone marrow aspirate was obtained followed by 1 inch biopsy. Dry tap was encountered and 6 biopsy attempts were made.  The procedure was tolerated well and there were no complications.  The patient was stable at the end of the procedure.  Specimens sent for flow cytometry, cytogenetics and additional studies.

## 2013-10-18 LAB — VITAMIN B12: VITAMIN B 12: 1980 pg/mL — AB (ref 211–911)

## 2013-10-18 LAB — ERYTHROPOIETIN: ERYTHROPOIETIN: 38.4 m[IU]/mL — AB (ref 2.6–18.5)

## 2013-10-21 LAB — CHROMOSOME ANALYSIS, BONE MARROW

## 2013-10-21 LAB — TISSUE HYBRIDIZATION (BONE MARROW)-NCBH

## 2013-10-27 ENCOUNTER — Ambulatory Visit (HOSPITAL_BASED_OUTPATIENT_CLINIC_OR_DEPARTMENT_OTHER): Payer: BC Managed Care – PPO | Admitting: Hematology and Oncology

## 2013-10-27 ENCOUNTER — Telehealth: Payer: Self-pay | Admitting: *Deleted

## 2013-10-27 VITALS — BP 126/64 | HR 70 | Temp 98.2°F | Resp 18 | Ht 73.0 in | Wt 222.4 lb

## 2013-10-27 DIAGNOSIS — D696 Thrombocytopenia, unspecified: Secondary | ICD-10-CM

## 2013-10-27 DIAGNOSIS — D72819 Decreased white blood cell count, unspecified: Secondary | ICD-10-CM

## 2013-10-27 DIAGNOSIS — D4620 Refractory anemia with excess of blasts, unspecified: Secondary | ICD-10-CM

## 2013-10-27 NOTE — Progress Notes (Signed)
[facility] OFFICE PROGRESS NOTE  Patient Care Team: Melinda Crutch, MD as PCP - General (Family Medicine)  DIAGNOSIS: Myelodysplastic syndrome, IPSS score intermediate risk with leukopenia and thrombocytopenia and blasts count less than 5%.  SUMMARY OF ONCOLOGIC HISTORY: He was noted to have mild plasmacytosis, eosinophilia and anemia. On March 2010, he had bone marrow aspirate and biopsy which showed dyspoietic changes less than 1% blasts. 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 Repeat bone marrow aspirate and biopsy dated 10/04/2013 showed eosinophilia and dyspoietic changes.  INTERVAL HISTORY: [name] 80 y.o. male returns for further followup. He bruises easily. The patient denies any recent signs or symptoms of bleeding such as spontaneous epistaxis, hematuria or hematochezia. Denies any recent infection.  I have reviewed the past medical history, past surgical history, social history and family history with the patient and they are unchanged from previous note.  ALLERGIES:  has No Known Allergies.  MEDICATIONS:  Current Outpatient Prescriptions  Medication Sig Dispense Refill  . aspirin 81 MG tablet Take 1 tablet (81 mg total) by mouth daily.      Marland Kitchen atorvastatin (LIPITOR) 20 MG tablet TAKE 1 TABLET (20 MG TOTAL) BY MOUTH DAILY.  30 tablet  6  . CALCIUM PO Take by mouth.       . carvedilol (COREG) 25 MG tablet Take 1 tablet (25 mg total) by mouth 2 (two) times daily.  180 tablet  3  . doxazosin (CARDURA) 4 MG tablet Take 4 mg by mouth at bedtime.        . furosemide (LASIX) 80 MG tablet Take 80 mg by mouth 2 (two) times daily.        Marland Kitchen GLUCOSAMINE PO Take 2,000 mg by mouth daily.       Marland Kitchen losartan (COZAAR) 100 MG tablet Take 100 mg by mouth daily.        . metFORMIN (GLUMETZA) 1000 MG (MOD) 24 hr tablet Take 1,000 mg by mouth daily with breakfast.        . Multiple Vitamins-Minerals (MULTIVITAMIN WITH MINERALS)  tablet Take 100 tablets by mouth daily.        Marland Kitchen NIFEdipine (ADALAT CC) 90 MG 24 hr tablet Take 90 mg by mouth daily.        . potassium chloride SA (K-DUR,KLOR-CON) 20 MEQ tablet Take 20 mEq by mouth 2 (two) times daily.        Marland Kitchen allopurinol (ZYLOPRIM) 300 MG tablet Take 300 mg by mouth daily.         No current facility-administered medications for this visit.    REVIEW OF SYSTEMS:   Constitutional: Denies fevers, chills or abnormal weight loss Eyes: Denies blurriness of vision Ears, nose, mouth, throat, and face: Denies mucositis or sore throat Respiratory: Denies cough, dyspnea or wheezes Cardiovascular: Denies palpitation, chest discomfort or lower extremity swelling Gastrointestinal:  Denies nausea, heartburn or change in bowel habits Skin: Denies abnormal skin rashes Lymphatics: Denies new lymphadenopathy  Neurological:Denies numbness, tingling or new weaknesses Behavioral/Psych: Mood is stable, no new changes  All other systems were reviewed with the patient and are negative.  PHYSICAL EXAMINATION: ECOG PERFORMANCE STATUS: 1 - Symptomatic but completely ambulatory  Filed Vitals:   10/27/13 0940  BP: 126/64  Pulse: 70  Temp: 98.2 F (36.8 C)  Resp: 18   Filed Weights   10/27/13 0940  Weight: 222 lb 6.4 oz (100.88 kg)    GENERAL:alert, no distress and comfortable SKIN: skin color, texture, turgor  are normal, no rashes or significant lesions Musculoskeletal:no cyanosis of digits and no clubbing  NEURO: alert & oriented x 3 with fluent speech, no focal motor/sensory deficits  LABORATORY DATA:  I have reviewed the data as listed    Component Value Date/Time   NA 140 10/13/2013 0901   NA 142 01/11/2013 0921   K 3.7 10/13/2013 0901   K 4.2 01/11/2013 0921   CL 105 10/13/2013 0901   CL 109* 04/13/2012 0852   CO2 28 10/13/2013 0901   CO2 25 01/11/2013 0921   GLUCOSE 137* 10/13/2013 0901   GLUCOSE 139 01/11/2013 0921   GLUCOSE 120* 04/13/2012 0852   BUN 14 10/13/2013 0901    BUN 15.3 01/11/2013 0921   CREATININE 1.1 10/13/2013 0901   CREATININE 1.2 01/11/2013 0921   CALCIUM 9.7 10/13/2013 0901   CALCIUM 10.2 01/11/2013 0921   PROT 7.7 10/13/2013 0901   PROT 8.2 01/11/2013 0921   ALBUMIN 4.0 10/13/2013 0901   ALBUMIN 4.0 01/11/2013 0921   AST 23 10/13/2013 0901   AST 17 01/11/2013 0921   ALT 17 10/13/2013 0901   ALT 9 01/11/2013 0921   ALKPHOS 56 10/13/2013 0901   ALKPHOS 71 01/11/2013 0921   BILITOT 0.7 10/13/2013 0901   BILITOT 0.61 01/11/2013 0921   GFRNONAA 56* 11/30/2008 0510   GFRAA  Value: >60        The eGFR has been calculated using the MDRD equation. This calculation has not been validated in all clinical situations. eGFR's persistently <60 mL/min signify possible Chronic Kidney Disease. 11/30/2008 0510    No results found for this basename: SPEP, UPEP,  kappa and lambda light chains    Lab Results  Component Value Date   WBC 5.6 10/14/2013   NEUTROABS 1.2* 10/14/2013   HGB 11.9* 10/14/2013   HCT 36.3* 10/14/2013   MCV 85.2 10/14/2013   PLT 64* 10/14/2013      Chemistry      Component Value Date/Time   NA 140 10/13/2013 0901   NA 142 01/11/2013 0921   K 3.7 10/13/2013 0901   K 4.2 01/11/2013 0921   CL 105 10/13/2013 0901   CL 109* 04/13/2012 0852   CO2 28 10/13/2013 0901   CO2 25 01/11/2013 0921   BUN 14 10/13/2013 0901   BUN 15.3 01/11/2013 0921   CREATININE 1.1 10/13/2013 0901   CREATININE 1.2 01/11/2013 0921      Component Value Date/Time   CALCIUM 9.7 10/13/2013 0901   CALCIUM 10.2 01/11/2013 0921   ALKPHOS 56 10/13/2013 0901   ALKPHOS 71 01/11/2013 0921   AST 23 10/13/2013 0901   AST 17 01/11/2013 0921   ALT 17 10/13/2013 0901   ALT 9 01/11/2013 0921   BILITOT 0.7 10/13/2013 0901   BILITOT 0.61 01/11/2013 0921     ASSESSMENT & PLAN:  #1 myelodysplastic syndrome #2 leukopenia #3 anemia #4 thrombocytopenia I reviewed the most recent bone marrow biopsy results with him. He has persistent myelodysplastic syndrome with hypereosinophilia but no evidence  of AML. He has increased bruising but no spontaneous bleeding. The patient is made aware of risk of infection and bleeding if his disease progresses. At present time, there is no indication for transfusion or starting him on chemotherapy. I educated the patient's signs and symptoms to watch out for disease progression including unresolved infection, progressive fatigue or worsening bruising or bleeding. I recommend he follows here every 3 months with history, physical examination and blood work.  Orders Placed This Encounter  Procedures  . CBC & Diff and Retic    Standing Status: Future     Number of Occurrences:      Standing Expiration Date: 10/27/2014  . Morphology    Standing Status: Future     Number of Occurrences:      Standing Expiration Date: 10/27/2014   All questions were answered. The patient knows to call the clinic with any problems, questions or concerns. No barriers to learning was detected. I spent 25 minutes counseling the patient face to face. The total time spent in the appointment was 30 minutes and more than 50% was on counseling and review of test results     Heath Lark, MD 10/27/2013 3:55 PM

## 2013-10-27 NOTE — Telephone Encounter (Signed)
Chart opened in error

## 2013-10-29 ENCOUNTER — Telehealth: Payer: Self-pay | Admitting: Hematology and Oncology

## 2013-10-29 NOTE — Telephone Encounter (Signed)
, °

## 2013-12-21 ENCOUNTER — Encounter: Payer: Self-pay | Admitting: Internal Medicine

## 2014-01-25 ENCOUNTER — Encounter: Payer: Self-pay | Admitting: Hematology and Oncology

## 2014-01-25 ENCOUNTER — Other Ambulatory Visit (HOSPITAL_BASED_OUTPATIENT_CLINIC_OR_DEPARTMENT_OTHER): Payer: Medicare Other

## 2014-01-25 ENCOUNTER — Telehealth: Payer: Self-pay | Admitting: Hematology and Oncology

## 2014-01-25 ENCOUNTER — Ambulatory Visit (HOSPITAL_BASED_OUTPATIENT_CLINIC_OR_DEPARTMENT_OTHER): Payer: Medicare Other | Admitting: Hematology and Oncology

## 2014-01-25 VITALS — BP 132/62 | HR 63 | Temp 98.2°F | Resp 20 | Ht 73.0 in | Wt 212.4 lb

## 2014-01-25 DIAGNOSIS — D4620 Refractory anemia with excess of blasts, unspecified: Secondary | ICD-10-CM

## 2014-01-25 DIAGNOSIS — D696 Thrombocytopenia, unspecified: Secondary | ICD-10-CM

## 2014-01-25 DIAGNOSIS — D61818 Other pancytopenia: Secondary | ICD-10-CM

## 2014-01-25 DIAGNOSIS — D630 Anemia in neoplastic disease: Secondary | ICD-10-CM

## 2014-01-25 DIAGNOSIS — M545 Low back pain, unspecified: Secondary | ICD-10-CM

## 2014-01-25 DIAGNOSIS — D72819 Decreased white blood cell count, unspecified: Secondary | ICD-10-CM

## 2014-01-25 DIAGNOSIS — M549 Dorsalgia, unspecified: Secondary | ICD-10-CM

## 2014-01-25 HISTORY — DX: Dorsalgia, unspecified: M54.9

## 2014-01-25 LAB — MORPHOLOGY: PLT EST: DECREASED

## 2014-01-25 LAB — CBC & DIFF AND RETIC
BASO%: 0.2 % (ref 0.0–2.0)
Basophils Absolute: 0 10*3/uL (ref 0.0–0.1)
EOS%: 63.5 % — AB (ref 0.0–7.0)
Eosinophils Absolute: 3.3 10*3/uL — ABNORMAL HIGH (ref 0.0–0.5)
HEMATOCRIT: 32.5 % — AB (ref 38.4–49.9)
HGB: 10.7 g/dL — ABNORMAL LOW (ref 13.0–17.1)
IMMATURE RETIC FRACT: 8.6 % (ref 3.00–10.60)
LYMPH#: 0.8 10*3/uL — AB (ref 0.9–3.3)
LYMPH%: 15.2 % (ref 14.0–49.0)
MCH: 27.3 pg (ref 27.2–33.4)
MCHC: 32.9 g/dL (ref 32.0–36.0)
MCV: 82.9 fL (ref 79.3–98.0)
MONO#: 0.1 10*3/uL (ref 0.1–0.9)
MONO%: 2.7 % (ref 0.0–14.0)
NEUT#: 0.9 10*3/uL — ABNORMAL LOW (ref 1.5–6.5)
NEUT%: 18.4 % — AB (ref 39.0–75.0)
Platelets: 57 10*3/uL — ABNORMAL LOW (ref 140–400)
RBC: 3.92 10*6/uL — ABNORMAL LOW (ref 4.20–5.82)
RDW: 18.6 % — ABNORMAL HIGH (ref 11.0–14.6)
RETIC %: 1.14 % (ref 0.80–1.80)
Retic Ct Abs: 44.69 10*3/uL (ref 34.80–93.90)
WBC: 5.1 10*3/uL (ref 4.0–10.3)

## 2014-01-25 MED ORDER — OXYCODONE HCL 5 MG PO TABS: 5.0000 mg | ORAL_TABLET | Freq: Four times a day (QID) | ORAL | Status: DC | PRN

## 2014-01-25 NOTE — Patient Instructions (Signed)
Azacitidine suspension for injection (subcutaneous use) What is this medicine? AZACITIDINE (ay [place]) is a chemotherapy drug. This medicine reduces the growth of cancer cells and can suppress the immune system. It is used for treating myelodysplastic syndrome or some types of leukemia. This medicine may be used for other purposes; ask your health care provider or pharmacist if you have questions. COMMON BRAND NAME(S): Vidaza What should I tell my health care provider before I take this medicine? They need to know if you have any of these conditions: -infection (especially a virus infection such as chickenpox, cold sores, or herpes) -kidney disease -liver disease -liver tumors -an unusual or allergic reaction to azacitidine, mannitol, other medicines, foods, dyes, or preservatives -pregnant or trying to get pregnant -breast-feeding How should I use this medicine? This medicine is for injection under the skin. It is administered in a hospital or clinic by a specially trained health care professional. Talk to your pediatrician regarding the use of this medicine in children. While this drug may be prescribed for selected conditions, precautions do apply. Overdosage: If you think you have taken too much of this medicine contact a poison control center or emergency room at once. NOTE: This medicine is only for you. Do not share this medicine with others. What if I miss a dose? It is important not to miss your dose. Call your doctor or health care professional if you are unable to keep an appointment. What may interact with this medicine? -vaccines Talk to your doctor or health care professional before taking any of these medicines: -acetaminophen -aspirin -ibuprofen -ketoprofen -naproxen This list may not describe all possible interactions. Give your health care provider a list of all the medicines, herbs, non-prescription drugs, or dietary supplements you use. Also tell them if you  smoke, drink alcohol, or use illegal drugs. Some items may interact with your medicine. What should I watch for while using this medicine? Visit your doctor for checks on your progress. This drug may make you feel generally unwell. This is not uncommon, as chemotherapy can affect healthy cells as well as cancer cells. Report any side effects. Continue your course of treatment even though you feel ill unless your doctor tells you to stop. In some cases, you may be given additional medicines to help with side effects. Follow all directions for their use. Call your doctor or health care professional for advice if you get a fever, chills or sore throat, or other symptoms of a cold or flu. Do not treat yourself. This drug decreases your body's ability to fight infections. Try to avoid being around people who are sick. This medicine may increase your risk to bruise or bleed. Call your doctor or health care professional if you notice any unusual bleeding. Be careful brushing and flossing your teeth or using a toothpick because you may get an infection or bleed more easily. If you have any dental work done, tell your dentist you are receiving this medicine. Avoid taking products that contain aspirin, acetaminophen, ibuprofen, naproxen, or ketoprofen unless instructed by your doctor. These medicines may hide a fever. Do not have any vaccinations without your doctor's approval and avoid anyone who has recently had oral polio vaccine. Do not become pregnant while taking this medicine. Women should inform their doctor if they wish to become pregnant or think they might be pregnant. There is a potential for serious side effects to an unborn child. Talk to your health care professional or pharmacist for more information.  Do not breast-feed an infant while taking this medicine. If you are a man, you should not father a child while receiving treatment. What side effects may I notice from receiving this medicine? Side  effects that you should report to your doctor or health care professional as soon as possible: -allergic reactions like skin rash, itching or hives, swelling of the face, lips, or tongue -low blood counts - this medicine may decrease the number of white blood cells, red blood cells and platelets. You may be at increased risk for infections and bleeding. -signs of infection - fever or chills, cough, sore throat, pain or difficulty passing urine -signs of decreased platelets or bleeding - bruising, pinpoint red spots on the skin, black, tarry stools, blood in the urine -signs of decreased red blood cells - unusually weak or tired, fainting spells, lightheadedness -reactions at the injection site including redness, pain, itching, or bruising -breathing problems -changes in vision -fever -mouth sores -stomach pain -vomiting Side effects that usually do not require medical attention (report to your doctor or health care professional if they continue or are bothersome): -constipation -diarrhea -loss of appetite -nausea -pain or redness at the injection site -weak or tired This list may not describe all possible side effects. Call your doctor for medical advice about side effects. You may report side effects to FDA at 1-800-FDA-1088. Where should I keep my medicine? This drug is given in a hospital or clinic and will not be stored at home. NOTE: This sheet is a summary. It may not cover all possible information. If you have questions about this medicine, talk to your doctor, pharmacist, or health care provider.  2015, [employer]. (2007-09-10 11:04:07)  

## 2014-01-25 NOTE — Assessment & Plan Note (Signed)
The patient has slight progression with progressive pancytopenia. I had a long discussion with the patient regarding treatment options including supportive therapy only versus chemotherapy. The patient is not keen to undergo chemotherapy. I will see him back in 2 months for repeat history, physical examination and blood work. I gave the patient education handout regarding the use of Vidaza.

## 2014-01-25 NOTE — Assessment & Plan Note (Signed)
This could be related to chronic arthritis versus disease. I gave him prescription of oxycodone. We discussed about the role of pain management in the oncology clinic. The patient agreed to be compliant with prescribed pain regimen and promised not to share the prescribed medications. Any lost medications or missed prescription will not be refilled sooner than anticipated time when the patient's prescription is expected to run out. We also discussed narcotics refill policy in the clinic.  The patient is educated to check the pill bottles in the middle of the week and ensure there is adequate supply to last through the weekend until next appointment or available business day.  The oncology service has a strict policy not to refill pain medications after business hours or the weekend.  

## 2014-01-25 NOTE — Progress Notes (Signed)
[place] OFFICE PROGRESS NOTE  Patient Care Team: Melinda Crutch, MD as PCP - General (Family Medicine)   DIAGNOSIS: Myelodysplastic syndrome, IPSS score intermediate risk with leukopenia and thrombocytopenia and blasts count less than 5%.  SUMMARY OF ONCOLOGIC HISTORY: He was noted to have mild plasmacytosis, eosinophilia and anemia. On March 2010, he had bone marrow aspirate and biopsy which showed dyspoietic changes less than 1% blasts. 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 Repeat bone marrow aspirate and biopsy dated 10/04/2013 showed eosinophilia and dyspoietic changes. He was placed on observation. INTERVAL HISTORY: Please see below for problem oriented charting. He complained of chronic back pain which is severe. It is relieved with hydrocodone. He denies recent infection. He complains of fatigue. He bruises easily. The patient denies any recent signs or symptoms of bleeding such as spontaneous epistaxis, hematuria or hematochezia.   REVIEW OF SYSTEMS:   Constitutional: Denies fevers, chills or abnormal weight loss Eyes: Denies blurriness of vision Ears, nose, mouth, throat, and face: Denies mucositis or sore throat Respiratory: Denies cough, dyspnea or wheezes Cardiovascular: Denies palpitation, chest discomfort or lower extremity swelling Gastrointestinal:  Denies nausea, heartburn or change in bowel habits Skin: Denies abnormal skin rashes Lymphatics: Denies new lymphadenopathy  Neurological:Denies numbness, tingling or new weaknesses Behavioral/Psych: Mood is stable, no new changes  All other systems were reviewed with the patient and are negative.  I have reviewed the past medical history, past surgical history, social history and family history with the patient and they are unchanged from previous note.  ALLERGIES:  has No Known Allergies.  MEDICATIONS:  Current Outpatient Prescriptions  Medication  Sig Dispense Refill  . allopurinol (ZYLOPRIM) 300 MG tablet Take 300 mg by mouth daily.        Marland Kitchen aspirin 81 MG tablet Take 1 tablet (81 mg total) by mouth daily.      Marland Kitchen atorvastatin (LIPITOR) 20 MG tablet TAKE 1 TABLET (20 MG TOTAL) BY MOUTH DAILY.  30 tablet  6  . CALCIUM PO Take by mouth.       . carvedilol (COREG) 25 MG tablet Take 1 tablet (25 mg total) by mouth 2 (two) times daily.  180 tablet  3  . doxazosin (CARDURA) 4 MG tablet Take 4 mg by mouth at bedtime.        . furosemide (LASIX) 80 MG tablet Take 80 mg by mouth 2 (two) times daily.        Marland Kitchen GLUCOSAMINE PO Take 2,000 mg by mouth daily.       Marland Kitchen losartan (COZAAR) 100 MG tablet Take 100 mg by mouth daily.        . metFORMIN (GLUMETZA) 1000 MG (MOD) 24 hr tablet Take 1,000 mg by mouth daily with breakfast.        . Multiple Vitamins-Minerals (MULTIVITAMIN WITH MINERALS) tablet Take 100 tablets by mouth daily.        Marland Kitchen NIFEdipine (ADALAT CC) 90 MG 24 hr tablet Take 90 mg by mouth daily.        . potassium chloride SA (K-DUR,KLOR-CON) 20 MEQ tablet Take 20 mEq by mouth 2 (two) times daily.        Marland Kitchen oxyCODONE (OXY IR/ROXICODONE) 5 MG immediate release tablet Take 1 tablet (5 mg total) by mouth every 6 (six) hours as needed for severe pain.  90 tablet  0   No current facility-administered medications for this visit.    PHYSICAL EXAMINATION: ECOG PERFORMANCE STATUS: 1 - Symptomatic but  completely ambulatory  Filed Vitals:   01/25/14 1003  BP: 132/62  Pulse: 63  Temp: 98.2 F (36.8 C)  Resp: 20   Filed Weights   01/25/14 1003  Weight: 212 lb 6.4 oz (96.344 kg)    GENERAL:alert, no distress and comfortable SKIN: skin color, texture, turgor are normal, no rashes or significant lesions EYES: normal, Conjunctiva are pink and non-injected, sclera clear OROPHARYNX:no exudate, no erythema and lips, buccal mucosa, and tongue normal  NECK: supple, thyroid normal size, non-tender, without nodularity LYMPH:  no palpable  lymphadenopathy in the cervical, axillary or inguinal LUNGS: clear to auscultation and percussion with normal breathing effort HEART: regular rate & rhythm and no murmurs and no lower extremity edema ABDOMEN:abdomen soft, non-tender and normal bowel sounds Musculoskeletal:no cyanosis of digits and no clubbing  NEURO: alert & oriented x 3 with fluent speech, no focal motor/sensory deficits  LABORATORY DATA:  I have reviewed the data as listed    Component Value Date/Time   NA 140 10/13/2013 0901   NA 142 01/11/2013 0921   K 3.7 10/13/2013 0901   K 4.2 01/11/2013 0921   CL 105 10/13/2013 0901   CL 109* 04/13/2012 0852   CO2 28 10/13/2013 0901   CO2 25 01/11/2013 0921   GLUCOSE 137* 10/13/2013 0901   GLUCOSE 139 01/11/2013 0921   GLUCOSE 120* 04/13/2012 0852   BUN 14 10/13/2013 0901   BUN 15.3 01/11/2013 0921   CREATININE 1.1 10/13/2013 0901   CREATININE 1.2 01/11/2013 0921   CALCIUM 9.7 10/13/2013 0901   CALCIUM 10.2 01/11/2013 0921   PROT 7.7 10/13/2013 0901   PROT 8.2 01/11/2013 0921   ALBUMIN 4.0 10/13/2013 0901   ALBUMIN 4.0 01/11/2013 0921   AST 23 10/13/2013 0901   AST 17 01/11/2013 0921   ALT 17 10/13/2013 0901   ALT 9 01/11/2013 0921   ALKPHOS 56 10/13/2013 0901   ALKPHOS 71 01/11/2013 0921   BILITOT 0.7 10/13/2013 0901   BILITOT 0.61 01/11/2013 0921   GFRNONAA 56* 11/30/2008 0510   GFRAA  Value: >60        The eGFR has been calculated using the MDRD equation. This calculation has not been validated in all clinical situations. eGFR's persistently <60 mL/min signify possible Chronic Kidney Disease. 11/30/2008 0510    No results found for this basename: SPEP,  UPEP,   kappa and lambda light chains    Lab Results  Component Value Date   WBC 5.1 01/25/2014   NEUTROABS 0.9* 01/25/2014   HGB 10.7* 01/25/2014   HCT 32.5* 01/25/2014   MCV 82.9 01/25/2014   PLT 57* 01/25/2014      Chemistry      Component Value Date/Time   NA 140 10/13/2013 0901   NA 142 01/11/2013 0921   K 3.7 10/13/2013 0901    K 4.2 01/11/2013 0921   CL 105 10/13/2013 0901   CL 109* 04/13/2012 0852   CO2 28 10/13/2013 0901   CO2 25 01/11/2013 0921   BUN 14 10/13/2013 0901   BUN 15.3 01/11/2013 0921   CREATININE 1.1 10/13/2013 0901   CREATININE 1.2 01/11/2013 0921      Component Value Date/Time   CALCIUM 9.7 10/13/2013 0901   CALCIUM 10.2 01/11/2013 0921   ALKPHOS 56 10/13/2013 0901   ALKPHOS 71 01/11/2013 0921   AST 23 10/13/2013 0901   AST 17 01/11/2013 0921   ALT 17 10/13/2013 0901   ALT 9 01/11/2013 0921   BILITOT 0.7 10/13/2013 0901  BILITOT 0.61 01/11/2013 0921      ASSESSMENT & PLAN:  MDS (myelodysplastic syndrome), low grade The patient has slight progression with progressive pancytopenia. I had a long discussion with the patient regarding treatment options including supportive therapy only versus chemotherapy. The patient is not keen to undergo chemotherapy. I will see him back in 2 months for repeat history, physical examination and blood work. I gave the patient education handout regarding the use of Vidaza.  Thrombocytopenia This is related to his underlying disease. It is mild and there is little change compared from previous platelet count. The patient denies recent history of bleeding such as epistaxis, hematuria or hematochezia. He is asymptomatic from the thrombocytopenia. I will observe for now.  he does not require transfusion now.     Anemia in neoplastic disease This is likely anemia related to underlying disease. The patient denies recent history of bleeding such as epistaxis, hematuria or hematochezia. He is asymptomatic from the anemia. We will observe for now.  He does not require transfusion now.    Leukopenia The patient is mildly neutropenic. He is at risk of infection. I will like the patient to consider treatment seriously.  Back pain This could be related to chronic arthritis versus disease. I gave him prescription of oxycodone. We discussed about the role of pain management in  the oncology clinic. The patient agreed to be compliant with prescribed pain regimen and promised not to share the prescribed medications. Any lost medications or missed prescription will not be refilled sooner than anticipated time when the patient's prescription is expected to run out. We also discussed narcotics refill policy in the clinic.  The patient is educated to check the pill bottles in the middle of the week and ensure there is adequate supply to last through the weekend until next appointment or available business day.  The oncology service has a strict policy not to refill pain medications after business hours or the weekend.     Orders Placed This Encounter  Procedures  . CBC with Differential    Standing Status: Future     Number of Occurrences:      Standing Expiration Date: 01/25/2015  . Comprehensive metabolic panel    Standing Status: Future     Number of Occurrences:      Standing Expiration Date: 01/25/2015  . Hold Tube, Blood Bank    Standing Status: Future     Number of Occurrences:      Standing Expiration Date: 01/25/2015   All questions were answered. The patient knows to call the clinic with any problems, questions or concerns. No barriers to learning was detected. I spent 25 minutes counseling the patient face to face. The total time spent in the appointment was 30 minutes and more than 50% was on counseling and review of test results     [facility], [place], MD 01/25/2014 11:19 AM

## 2014-01-25 NOTE — Assessment & Plan Note (Signed)
This is related to his underlying disease. It is mild and there is little change compared from previous platelet count. The patient denies recent history of bleeding such as epistaxis, hematuria or hematochezia. He is asymptomatic from the thrombocytopenia. I will observe for now.  he does not require transfusion now.  

## 2014-01-25 NOTE — Assessment & Plan Note (Signed)
The patient is mildly neutropenic. He is at risk of infection. I will like the patient to consider treatment seriously.

## 2014-01-25 NOTE — Assessment & Plan Note (Signed)
This is likely anemia related to underlying disease. The patient denies recent history of bleeding such as epistaxis, hematuria or hematochezia. He is asymptomatic from the anemia. We will observe for now.  He does not require transfusion now.  

## 2014-03-29 ENCOUNTER — Encounter: Payer: Self-pay | Admitting: Hematology and Oncology

## 2014-03-29 ENCOUNTER — Other Ambulatory Visit (HOSPITAL_BASED_OUTPATIENT_CLINIC_OR_DEPARTMENT_OTHER): Payer: Medicare Other

## 2014-03-29 ENCOUNTER — Ambulatory Visit (HOSPITAL_BASED_OUTPATIENT_CLINIC_OR_DEPARTMENT_OTHER): Payer: Medicare Other | Admitting: Hematology and Oncology

## 2014-03-29 VITALS — BP 139/59 | HR 57 | Temp 98.0°F | Resp 18 | Ht 73.0 in | Wt 208.8 lb

## 2014-03-29 DIAGNOSIS — D4620 Refractory anemia with excess of blasts, unspecified: Secondary | ICD-10-CM

## 2014-03-29 DIAGNOSIS — D696 Thrombocytopenia, unspecified: Secondary | ICD-10-CM

## 2014-03-29 DIAGNOSIS — D630 Anemia in neoplastic disease: Secondary | ICD-10-CM

## 2014-03-29 DIAGNOSIS — D72819 Decreased white blood cell count, unspecified: Secondary | ICD-10-CM

## 2014-03-29 DIAGNOSIS — M545 Low back pain, unspecified: Secondary | ICD-10-CM

## 2014-03-29 LAB — COMPREHENSIVE METABOLIC PANEL (CC13)
ALBUMIN: 3.7 g/dL (ref 3.5–5.0)
ALT: 6 U/L (ref 0–55)
ANION GAP: 6 meq/L (ref 3–11)
AST: 16 U/L (ref 5–34)
Alkaline Phosphatase: 55 U/L (ref 40–150)
BUN: 14.5 mg/dL (ref 7.0–26.0)
CO2: 26 meq/L (ref 22–29)
Calcium: 9.6 mg/dL (ref 8.4–10.4)
Chloride: 112 mEq/L — ABNORMAL HIGH (ref 98–109)
Creatinine: 1.2 mg/dL (ref 0.7–1.3)
GLUCOSE: 106 mg/dL (ref 70–140)
Potassium: 4 mEq/L (ref 3.5–5.1)
SODIUM: 144 meq/L (ref 136–145)
Total Bilirubin: 0.69 mg/dL (ref 0.20–1.20)
Total Protein: 7.2 g/dL (ref 6.4–8.3)

## 2014-03-29 LAB — CBC WITH DIFFERENTIAL/PLATELET
BASO%: 0.8 % (ref 0.0–2.0)
BASOS ABS: 0 10*3/uL (ref 0.0–0.1)
EOS ABS: 2.3 10*3/uL — AB (ref 0.0–0.5)
EOS%: 49.5 % — ABNORMAL HIGH (ref 0.0–7.0)
HCT: 33.5 % — ABNORMAL LOW (ref 38.4–49.9)
HGB: 10.7 g/dL — ABNORMAL LOW (ref 13.0–17.1)
LYMPH%: 12.6 % — AB (ref 14.0–49.0)
MCH: 27.3 pg (ref 27.2–33.4)
MCHC: 31.9 g/dL — ABNORMAL LOW (ref 32.0–36.0)
MCV: 85.5 fL (ref 79.3–98.0)
MONO#: 0.1 10*3/uL (ref 0.1–0.9)
MONO%: 2.3 % (ref 0.0–14.0)
NEUT%: 34.8 % — ABNORMAL LOW (ref 39.0–75.0)
NEUTROS ABS: 1.6 10*3/uL (ref 1.5–6.5)
Platelets: 90 10*3/uL — ABNORMAL LOW (ref 140–400)
RBC: 3.92 10*6/uL — AB (ref 4.20–5.82)
RDW: 20.5 % — ABNORMAL HIGH (ref 11.0–14.6)
WBC: 4.6 10*3/uL (ref 4.0–10.3)
lymph#: 0.6 10*3/uL — ABNORMAL LOW (ref 0.9–3.3)

## 2014-03-29 LAB — HOLD TUBE, BLOOD BANK

## 2014-03-29 MED ORDER — OXYCODONE HCL 5 MG PO TABS: 5.0000 mg | ORAL_TABLET | Freq: Four times a day (QID) | ORAL | Status: DC | PRN

## 2014-03-29 NOTE — Assessment & Plan Note (Signed)
This is likely anemia related to underlying disease. The patient denies recent history of bleeding such as epistaxis, hematuria or hematochezia. He is asymptomatic from the anemia. We will observe for now.  He does not require transfusion now.  

## 2014-03-29 NOTE — Assessment & Plan Note (Signed)
The patient is mildly neutropenic. Continue close monitoring.

## 2014-03-29 NOTE — Assessment & Plan Note (Signed)
This is related to his underlying disease. It is mild and there is little change compared from previous platelet count. The patient denies recent history of bleeding such as epistaxis, hematuria or hematochezia. He is asymptomatic from the thrombocytopenia. I will observe for now.  he does not require transfusion now.  

## 2014-03-29 NOTE — Assessment & Plan Note (Signed)
This could be related to chronic arthritis versus disease. I gave him prescription of oxycodone. We discussed narcotic refill policy.

## 2014-03-29 NOTE — Assessment & Plan Note (Signed)
The patient has stable disease I had a long discussion with the patient regarding treatment options including supportive therapy only versus chemotherapy. The patient is not keen to undergo chemotherapy. I will see him back in 3 months for repeat history, physical examination and blood work.

## 2014-03-29 NOTE — Progress Notes (Signed)
[place] OFFICE PROGRESS NOTE  Patient Care Team: Melinda Crutch, MD as PCP - General (Family Medicine)  SUMMARY OF ONCOLOGIC HISTORY:  DIAGNOSIS: Myelodysplastic syndrome, IPSS score intermediate risk with leukopenia and thrombocytopenia and blasts count less than 5%.  SUMMARY OF ONCOLOGIC HISTORY: He was noted to have mild plasmacytosis, eosinophilia and anemia. On March 2010, he had bone marrow aspirate and biopsy which showed dyspoietic changes less than 1% blasts. 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 Repeat bone marrow aspirate and biopsy dated 10/04/2013 showed eosinophilia and dyspoietic changes. He was placed on observation.  INTERVAL HISTORY: Please see below for problem oriented charting. His back pain is much improved since I put him on oxycodone. He denies recent infection. The patient denies any recent signs or symptoms of bleeding such as spontaneous epistaxis, hematuria or hematochezia.  REVIEW OF SYSTEMS:   Constitutional: Denies fevers, chills or abnormal weight loss Eyes: Denies blurriness of vision Ears, nose, mouth, throat, and face: Denies mucositis or sore throat Respiratory: Denies cough, dyspnea or wheezes Cardiovascular: Denies palpitation, chest discomfort or lower extremity swelling Gastrointestinal:  Denies nausea, heartburn or change in bowel habits Skin: Denies abnormal skin rashes Lymphatics: Denies new lymphadenopathy or easy bruising Neurological:Denies numbness, tingling or new weaknesses Behavioral/Psych: Mood is stable, no new changes  All other systems were reviewed with the patient and are negative.  I have reviewed the past medical history, past surgical history, social history and family history with the patient and they are unchanged from previous note.  ALLERGIES:  has No Known Allergies.  MEDICATIONS:  Current Outpatient Prescriptions  Medication Sig Dispense Refill  .  allopurinol (ZYLOPRIM) 300 MG tablet Take 300 mg by mouth daily.        Marland Kitchen aspirin 81 MG tablet Take 1 tablet (81 mg total) by mouth daily.      Marland Kitchen atorvastatin (LIPITOR) 20 MG tablet TAKE 1 TABLET (20 MG TOTAL) BY MOUTH DAILY.  30 tablet  6  . CALCIUM PO Take by mouth.       . carvedilol (COREG) 25 MG tablet Take 1 tablet (25 mg total) by mouth 2 (two) times daily.  180 tablet  3  . doxazosin (CARDURA) 4 MG tablet Take 4 mg by mouth at bedtime.        . furosemide (LASIX) 80 MG tablet Take 80 mg by mouth 2 (two) times daily.        Marland Kitchen GLUCOSAMINE PO Take 2,000 mg by mouth daily.       Marland Kitchen losartan (COZAAR) 100 MG tablet Take 100 mg by mouth daily.        . metFORMIN (GLUMETZA) 1000 MG (MOD) 24 hr tablet Take 1,000 mg by mouth daily with breakfast.        . Multiple Vitamins-Minerals (MULTIVITAMIN WITH MINERALS) tablet Take 100 tablets by mouth daily.        Marland Kitchen NIFEdipine (ADALAT CC) 90 MG 24 hr tablet Take 90 mg by mouth daily.        Marland Kitchen oxyCODONE (OXY IR/ROXICODONE) 5 MG immediate release tablet Take 1 tablet (5 mg total) by mouth every 6 (six) hours as needed for severe pain.  60 tablet  0  . potassium chloride SA (K-DUR,KLOR-CON) 20 MEQ tablet Take 20 mEq by mouth 2 (two) times daily.         No current facility-administered medications for this visit.    PHYSICAL EXAMINATION: ECOG PERFORMANCE STATUS: 0 - Asymptomatic  Filed Vitals:  03/29/14 0929  BP: 139/59  Pulse: 57  Temp: 98 F (36.7 C)  Resp: 18   Filed Weights   03/29/14 0929  Weight: 208 lb 12.8 oz (94.711 kg)    GENERAL:alert, no distress and comfortable SKIN: skin color, texture, turgor are normal, no rashes or significant lesions EYES: normal, Conjunctiva are pink and non-injected, sclera clear OROPHARYNX:no exudate, no erythema and lips, buccal mucosa, and tongue normal  NECK: supple, thyroid normal size, non-tender, without nodularity LYMPH:  no palpable lymphadenopathy in the cervical, axillary or inguinal LUNGS:  clear to auscultation and percussion with normal breathing effort HEART: regular rate & rhythm with a soft murmur, no lower extremity edema ABDOMEN:abdomen soft, non-tender and normal bowel sounds Musculoskeletal:no cyanosis of digits and no clubbing  NEURO: alert & oriented x 3 with fluent speech, no focal motor/sensory deficits  LABORATORY DATA:  I have reviewed the data as listed    Component Value Date/Time   NA 144 03/29/2014 0913   NA 140 10/13/2013 0901   K 4.0 03/29/2014 0913   K 3.7 10/13/2013 0901   CL 105 10/13/2013 0901   CL 109* 04/13/2012 0852   CO2 26 03/29/2014 0913   CO2 28 10/13/2013 0901   GLUCOSE 106 03/29/2014 0913   GLUCOSE 137* 10/13/2013 0901   GLUCOSE 120* 04/13/2012 0852   BUN 14.5 03/29/2014 0913   BUN 14 10/13/2013 0901   CREATININE 1.2 03/29/2014 0913   CREATININE 1.1 10/13/2013 0901   CALCIUM 9.6 03/29/2014 0913   CALCIUM 9.7 10/13/2013 0901   PROT 7.2 03/29/2014 0913   PROT 7.7 10/13/2013 0901   ALBUMIN 3.7 03/29/2014 0913   ALBUMIN 4.0 10/13/2013 0901   AST 16 03/29/2014 0913   AST 23 10/13/2013 0901   ALT 6 03/29/2014 0913   ALT 17 10/13/2013 0901   ALKPHOS 55 03/29/2014 0913   ALKPHOS 56 10/13/2013 0901   BILITOT 0.69 03/29/2014 0913   BILITOT 0.7 10/13/2013 0901   GFRNONAA 56* 11/30/2008 0510   GFRAA  Value: >60        The eGFR has been calculated using the MDRD equation. This calculation has not been validated in all clinical situations. eGFR's persistently <60 mL/min signify possible Chronic Kidney Disease. 11/30/2008 0510    No results found for this basename: SPEP,  UPEP,   kappa and lambda light chains    Lab Results  Component Value Date   WBC 4.6 03/29/2014   NEUTROABS 1.6 03/29/2014   HGB 10.7* 03/29/2014   HCT 33.5* 03/29/2014   MCV 85.5 03/29/2014   PLT 90* 03/29/2014      Chemistry      Component Value Date/Time   NA 144 03/29/2014 0913   NA 140 10/13/2013 0901   K 4.0 03/29/2014 0913   K 3.7 10/13/2013 0901   CL 105 10/13/2013 0901   CL 109*  04/13/2012 0852   CO2 26 03/29/2014 0913   CO2 28 10/13/2013 0901   BUN 14.5 03/29/2014 0913   BUN 14 10/13/2013 0901   CREATININE 1.2 03/29/2014 0913   CREATININE 1.1 10/13/2013 0901      Component Value Date/Time   CALCIUM 9.6 03/29/2014 0913   CALCIUM 9.7 10/13/2013 0901   ALKPHOS 55 03/29/2014 0913   ALKPHOS 56 10/13/2013 0901   AST 16 03/29/2014 0913   AST 23 10/13/2013 0901   ALT 6 03/29/2014 0913   ALT 17 10/13/2013 0901   BILITOT 0.69 03/29/2014 0913   BILITOT 0.7 10/13/2013 0901  ASSESSMENT & PLAN:  MDS (myelodysplastic syndrome), low grade The patient has stable disease I had a long discussion with the patient regarding treatment options including supportive therapy only versus chemotherapy. The patient is not keen to undergo chemotherapy. I will see him back in 3 months for repeat history, physical examination and blood work.   Anemia in neoplastic disease This is likely anemia related to underlying disease. The patient denies recent history of bleeding such as epistaxis, hematuria or hematochezia. He is asymptomatic from the anemia. We will observe for now.  He does not require transfusion now.   Thrombocytopenia This is related to his underlying disease. It is mild and there is little change compared from previous platelet count. The patient denies recent history of bleeding such as epistaxis, hematuria or hematochezia. He is asymptomatic from the thrombocytopenia. I will observe for now.  he does not require transfusion now.   Leukopenia The patient is mildly neutropenic. Continue close monitoring.  Midline low back pain without sciatica This could be related to chronic arthritis versus disease. I gave him prescription of oxycodone. We discussed narcotic refill policy.    No orders of the defined types were placed in this encounter.   All questions were answered. The patient knows to call the clinic with any problems, questions or concerns. No barriers to learning was  detected. I spent 25 minutes counseling the patient face to face. The total time spent in the appointment was 30 minutes and more than 50% was on counseling and review of test results     [facility], [place], MD 03/29/2014 11:09 AM

## 2014-03-30 ENCOUNTER — Telehealth: Payer: Self-pay | Admitting: Hematology and Oncology

## 2014-03-30 NOTE — Telephone Encounter (Signed)
s.w.l pt and advised on Dec appt.Marland KitchenMarland KitchenMarland KitchenMarland Kitchenpt ok and aware

## 2014-05-04 ENCOUNTER — Telehealth: Payer: Self-pay | Admitting: *Deleted

## 2014-05-04 ENCOUNTER — Other Ambulatory Visit: Payer: Self-pay | Admitting: Hematology and Oncology

## 2014-05-04 DIAGNOSIS — M545 Low back pain, unspecified: Secondary | ICD-10-CM

## 2014-05-04 DIAGNOSIS — D72819 Decreased white blood cell count, unspecified: Secondary | ICD-10-CM

## 2014-05-04 DIAGNOSIS — D696 Thrombocytopenia, unspecified: Secondary | ICD-10-CM

## 2014-05-04 DIAGNOSIS — D4620 Refractory anemia with excess of blasts, unspecified: Secondary | ICD-10-CM

## 2014-05-04 DIAGNOSIS — D630 Anemia in neoplastic disease: Secondary | ICD-10-CM

## 2014-05-04 MED ORDER — OXYCODONE HCL 5 MG PO TABS: 5.0000 mg | ORAL_TABLET | Freq: Four times a day (QID) | ORAL | Status: DC | PRN

## 2014-05-04 NOTE — Telephone Encounter (Signed)
Pt requests refill on Oxycodone.

## 2014-05-04 NOTE — Telephone Encounter (Signed)
Left VM for pt informing of Rx ready to pick up.

## 2014-06-28 ENCOUNTER — Telehealth: Payer: Self-pay | Admitting: Hematology and Oncology

## 2014-06-28 ENCOUNTER — Other Ambulatory Visit (HOSPITAL_BASED_OUTPATIENT_CLINIC_OR_DEPARTMENT_OTHER): Payer: Medicare Other

## 2014-06-28 ENCOUNTER — Encounter: Payer: Self-pay | Admitting: Hematology and Oncology

## 2014-06-28 ENCOUNTER — Ambulatory Visit (HOSPITAL_BASED_OUTPATIENT_CLINIC_OR_DEPARTMENT_OTHER): Payer: Medicare Other | Admitting: Hematology and Oncology

## 2014-06-28 VITALS — BP 128/56 | HR 53 | Temp 98.1°F | Resp 18 | Ht 73.0 in | Wt 201.9 lb

## 2014-06-28 DIAGNOSIS — D72819 Decreased white blood cell count, unspecified: Secondary | ICD-10-CM

## 2014-06-28 DIAGNOSIS — M545 Low back pain, unspecified: Secondary | ICD-10-CM

## 2014-06-28 DIAGNOSIS — D46Z Other myelodysplastic syndromes: Secondary | ICD-10-CM

## 2014-06-28 DIAGNOSIS — D630 Anemia in neoplastic disease: Secondary | ICD-10-CM

## 2014-06-28 DIAGNOSIS — D4620 Refractory anemia with excess of blasts, unspecified: Secondary | ICD-10-CM

## 2014-06-28 DIAGNOSIS — D696 Thrombocytopenia, unspecified: Secondary | ICD-10-CM

## 2014-06-28 LAB — COMPREHENSIVE METABOLIC PANEL (CC13)
ALBUMIN: 3.9 g/dL (ref 3.5–5.0)
ALT: 8 U/L (ref 0–55)
AST: 19 U/L (ref 5–34)
Alkaline Phosphatase: 56 U/L (ref 40–150)
Anion Gap: 8 mEq/L (ref 3–11)
BUN: 19 mg/dL (ref 7.0–26.0)
CALCIUM: 9.8 mg/dL (ref 8.4–10.4)
CO2: 26 mEq/L (ref 22–29)
CREATININE: 1.2 mg/dL (ref 0.7–1.3)
Chloride: 110 mEq/L — ABNORMAL HIGH (ref 98–109)
EGFR: 67 mL/min/{1.73_m2} — AB (ref >90)
GLUCOSE: 116 mg/dL (ref 70–140)
Potassium: 4.5 mEq/L (ref 3.5–5.1)
Sodium: 144 mEq/L (ref 136–145)
Total Bilirubin: 0.65 mg/dL (ref 0.20–1.20)
Total Protein: 7.3 g/dL (ref 6.4–8.3)

## 2014-06-28 LAB — CBC & DIFF AND RETIC
BASO%: 0.2 % (ref 0.0–2.0)
Basophils Absolute: 0 10*3/uL (ref 0.0–0.1)
EOS ABS: 3 10*3/uL — AB (ref 0.0–0.5)
EOS%: 62.7 % — AB (ref 0.0–7.0)
HCT: 34.1 % — ABNORMAL LOW (ref 38.4–49.9)
HGB: 11.2 g/dL — ABNORMAL LOW (ref 13.0–17.1)
Immature Retic Fract: 5.8 % (ref 3.00–10.60)
LYMPH#: 0.6 10*3/uL — AB (ref 0.9–3.3)
LYMPH%: 13.3 % — ABNORMAL LOW (ref 14.0–49.0)
MCH: 27.8 pg (ref 27.2–33.4)
MCHC: 32.8 g/dL (ref 32.0–36.0)
MCV: 84.6 fL (ref 79.3–98.0)
MONO#: 0.1 10*3/uL (ref 0.1–0.9)
MONO%: 2.5 % (ref 0.0–14.0)
NEUT#: 1 10*3/uL — ABNORMAL LOW (ref 1.5–6.5)
NEUT%: 21.3 % — ABNORMAL LOW (ref 39.0–75.0)
Platelets: 64 10*3/uL — ABNORMAL LOW (ref 140–400)
RBC: 4.03 10*6/uL — ABNORMAL LOW (ref 4.20–5.82)
RDW: 18.2 % — AB (ref 11.0–14.6)
RETIC %: 1.1 % (ref 0.80–1.80)
RETIC CT ABS: 44.33 10*3/uL (ref 34.80–93.90)
WBC: 4.7 10*3/uL (ref 4.0–10.3)

## 2014-06-28 LAB — LACTATE DEHYDROGENASE (CC13): LDH: 239 U/L (ref 125–245)

## 2014-06-28 LAB — HOLD TUBE, BLOOD BANK

## 2014-06-28 MED ORDER — OXYCODONE HCL 5 MG PO TABS: 5.0000 mg | ORAL_TABLET | Freq: Four times a day (QID) | ORAL | Status: DC | PRN

## 2014-06-28 NOTE — Telephone Encounter (Signed)
gv and printed appt sched and avs for pt for June 2016 °

## 2014-06-29 NOTE — Assessment & Plan Note (Signed)
This is related to his underlying disease. It is mild and there is little change compared from previous platelet count. The patient denies recent history of bleeding such as epistaxis, hematuria or hematochezia. He is asymptomatic from the thrombocytopenia. I will observe for now.  he does not require transfusion now.

## 2014-06-29 NOTE — Assessment & Plan Note (Signed)
This could be related to chronic arthritis versus disease. I gave him prescription of oxycodone. We discussed about the role of pain management in the oncology clinic. The patient agreed to be compliant with prescribed pain regimen and promised not to share the prescribed medications. Any lost medications or missed prescription will not be refilled sooner than anticipated time when the patient's prescription is expected to run out. We also discussed narcotics refill policy in the clinic.  The patient is educated to check the pill bottles in the middle of the week and ensure there is adequate supply to last through the weekend until next appointment or available business day.  The oncology service has a strict policy not to refill pain medications after business hours or the weekend.

## 2014-06-29 NOTE — Assessment & Plan Note (Signed)
This is likely anemia related to underlying disease. The patient denies recent history of bleeding such as epistaxis, hematuria or hematochezia. He is asymptomatic from the anemia. We will observe for now.  He does not require transfusion now.

## 2014-06-29 NOTE — Assessment & Plan Note (Signed)
The patient has stable disease I had a long discussion with the patient regarding treatment options including supportive therapy only versus chemotherapy. The patient is not keen to undergo chemotherapy. I will see him back in 6 months for repeat history, physical examination and blood work.

## 2014-06-29 NOTE — Progress Notes (Signed)
[place] OFFICE PROGRESS NOTE  Patient Care Team: Melinda Crutch, MD as PCP - General (Family Medicine)  SUMMARY OF ONCOLOGIC HISTORY:  DIAGNOSIS: Myelodysplastic syndrome, IPSS score intermediate risk with leukopenia and thrombocytopenia and blasts count less than 5%.  SUMMARY OF ONCOLOGIC HISTORY: He was noted to have mild plasmacytosis, eosinophilia and anemia. On March 2010, he had bone marrow aspirate and biopsy which showed dyspoietic changes less than 1% blasts. 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 Repeat bone marrow aspirate and biopsy dated 10/04/2013 showed eosinophilia and dyspoietic changes. He was placed on observation.  INTERVAL HISTORY: Please see below for problem oriented charting. He feels well. Denies recent infection. He continued to take pain medicine 3-4 times a week for point pain.  REVIEW OF SYSTEMS:   Constitutional: Denies fevers, chills or abnormal weight loss Eyes: Denies blurriness of vision Ears, nose, mouth, throat, and face: Denies mucositis or sore throat Respiratory: Denies cough, dyspnea or wheezes Cardiovascular: Denies palpitation, chest discomfort or lower extremity swelling Gastrointestinal:  Denies nausea, heartburn or change in bowel habits Skin: Denies abnormal skin rashes Lymphatics: Denies new lymphadenopathy or easy bruising Neurological:Denies numbness, tingling or new weaknesses Behavioral/Psych: Mood is stable, no new changes  All other systems were reviewed with the patient and are negative.  I have reviewed the past medical history, past surgical history, social history and family history with the patient and they are unchanged from previous note.  ALLERGIES:  has No Known Allergies.  MEDICATIONS:  Current Outpatient Prescriptions  Medication Sig Dispense Refill  . allopurinol (ZYLOPRIM) 300 MG tablet Take 300 mg by mouth daily.      Marland Kitchen aspirin 81 MG tablet Take 1  tablet (81 mg total) by mouth daily.    Marland Kitchen atorvastatin (LIPITOR) 20 MG tablet TAKE 1 TABLET (20 MG TOTAL) BY MOUTH DAILY. 30 tablet 6  . CALCIUM PO Take by mouth.     . carvedilol (COREG) 25 MG tablet Take 1 tablet (25 mg total) by mouth 2 (two) times daily. 180 tablet 3  . doxazosin (CARDURA) 4 MG tablet Take 4 mg by mouth at bedtime.      . furosemide (LASIX) 80 MG tablet Take 80 mg by mouth 2 (two) times daily.      Marland Kitchen GLUCOSAMINE PO Take 2,000 mg by mouth daily.     Marland Kitchen losartan (COZAAR) 100 MG tablet Take 100 mg by mouth daily.      . metFORMIN (GLUMETZA) 1000 MG (MOD) 24 hr tablet Take 1,000 mg by mouth daily with breakfast.      . metoprolol (LOPRESSOR) 50 MG tablet Take 50 mg by mouth 2 (two) times daily.  4  . Multiple Vitamins-Minerals (MULTIVITAMIN WITH MINERALS) tablet Take 100 tablets by mouth daily.      Marland Kitchen NIFEdipine (ADALAT CC) 90 MG 24 hr tablet Take 90 mg by mouth daily.      Marland Kitchen oxyCODONE (OXY IR/ROXICODONE) 5 MG immediate release tablet Take 1 tablet (5 mg total) by mouth every 6 (six) hours as needed for severe pain. 90 tablet 0  . potassium chloride SA (K-DUR,KLOR-CON) 20 MEQ tablet Take 20 mEq by mouth 2 (two) times daily.       No current facility-administered medications for this visit.    PHYSICAL EXAMINATION: ECOG PERFORMANCE STATUS: 0 - Asymptomatic  Filed Vitals:   06/28/14 0920  BP: 128/56  Pulse: 53  Temp: 98.1 F (36.7 C)  Resp: 18   Filed Weights  06/28/14 0920  Weight: 201 lb 14.4 oz (91.581 kg)    GENERAL:alert, no distress and comfortable SKIN: skin color, texture, turgor are normal, no rashes or significant lesions EYES: normal, Conjunctiva are pink and non-injected, sclera clear OROPHARYNX:no exudate, no erythema and lips, buccal mucosa, and tongue normal  NECK: supple, thyroid normal size, non-tender, without nodularity LYMPH:  no palpable lymphadenopathy in the cervical, axillary or inguinal LUNGS: clear to auscultation and percussion with  normal breathing effort HEART: regular rate & rhythm and no murmurs and no lower extremity edema ABDOMEN:abdomen soft, non-tender and normal bowel sounds Musculoskeletal:no cyanosis of digits and no clubbing  NEURO: alert & oriented x 3 with fluent speech, no focal motor/sensory deficits  LABORATORY DATA:  I have reviewed the data as listed    Component Value Date/Time   NA 144 06/28/2014 0816   NA 140 10/13/2013 0901   K 4.5 06/28/2014 0816   K 3.7 10/13/2013 0901   CL 105 10/13/2013 0901   CL 109* 04/13/2012 0852   CO2 26 06/28/2014 0816   CO2 28 10/13/2013 0901   GLUCOSE 116 06/28/2014 0816   GLUCOSE 137* 10/13/2013 0901   GLUCOSE 120* 04/13/2012 0852   BUN 19.0 06/28/2014 0816   BUN 14 10/13/2013 0901   CREATININE 1.2 06/28/2014 0816   CREATININE 1.1 10/13/2013 0901   CALCIUM 9.8 06/28/2014 0816   CALCIUM 9.7 10/13/2013 0901   PROT 7.3 06/28/2014 0816   PROT 7.7 10/13/2013 0901   ALBUMIN 3.9 06/28/2014 0816   ALBUMIN 4.0 10/13/2013 0901   AST 19 06/28/2014 0816   AST 23 10/13/2013 0901   ALT 8 06/28/2014 0816   ALT 17 10/13/2013 0901   ALKPHOS 56 06/28/2014 0816   ALKPHOS 56 10/13/2013 0901   BILITOT 0.65 06/28/2014 0816   BILITOT 0.7 10/13/2013 0901   GFRNONAA 56* 11/30/2008 0510   GFRAA  11/30/2008 0510    >60        The eGFR has been calculated using the MDRD equation. This calculation has not been validated in all clinical situations. eGFR's persistently <60 mL/min signify possible Chronic Kidney Disease.    No results found for: SPEP, UPEP  Lab Results  Component Value Date   WBC 4.7 06/28/2014   NEUTROABS 1.0* 06/28/2014   HGB 11.2* 06/28/2014   HCT 34.1* 06/28/2014   MCV 84.6 06/28/2014   PLT 64* 06/28/2014      Chemistry      Component Value Date/Time   NA 144 06/28/2014 0816   NA 140 10/13/2013 0901   K 4.5 06/28/2014 0816   K 3.7 10/13/2013 0901   CL 105 10/13/2013 0901   CL 109* 04/13/2012 0852   CO2 26 06/28/2014 0816   CO2 28  10/13/2013 0901   BUN 19.0 06/28/2014 0816   BUN 14 10/13/2013 0901   CREATININE 1.2 06/28/2014 0816   CREATININE 1.1 10/13/2013 0901      Component Value Date/Time   CALCIUM 9.8 06/28/2014 0816   CALCIUM 9.7 10/13/2013 0901   ALKPHOS 56 06/28/2014 0816   ALKPHOS 56 10/13/2013 0901   AST 19 06/28/2014 0816   AST 23 10/13/2013 0901   ALT 8 06/28/2014 0816   ALT 17 10/13/2013 0901   BILITOT 0.65 06/28/2014 0816   BILITOT 0.7 10/13/2013 0901     ASSESSMENT & PLAN:  MDS (myelodysplastic syndrome), low grade The patient has stable disease I had a long discussion with the patient regarding treatment options including supportive therapy only versus chemotherapy.  The patient is not keen to undergo chemotherapy. I will see him back in 6 months for repeat history, physical examination and blood work.  Anemia in neoplastic disease This is likely anemia related to underlying disease. The patient denies recent history of bleeding such as epistaxis, hematuria or hematochezia. He is asymptomatic from the anemia. We will observe for now.  He does not require transfusion now.   Thrombocytopenia This is related to his underlying disease. It is mild and there is little change compared from previous platelet count. The patient denies recent history of bleeding such as epistaxis, hematuria or hematochezia. He is asymptomatic from the thrombocytopenia. I will observe for now.  he does not require transfusion now.   Midline low back pain without sciatica This could be related to chronic arthritis versus disease. I gave him prescription of oxycodone. We discussed about the role of pain management in the oncology clinic. The patient agreed to be compliant with prescribed pain regimen and promised not to share the prescribed medications. Any lost medications or missed prescription will not be refilled sooner than anticipated time when the patient's prescription is expected to run out. We also discussed  narcotics refill policy in the clinic.  The patient is educated to check the pill bottles in the middle of the week and ensure there is adequate supply to last through the weekend until next appointment or available business day.  The oncology service has a strict policy not to refill pain medications after business hours or the weekend.    Orders Placed This Encounter  Procedures  . CBC with Differential    Standing Status: Future     Number of Occurrences:      Standing Expiration Date: 08/02/2015  . Comprehensive metabolic panel    Standing Status: Future     Number of Occurrences:      Standing Expiration Date: 08/02/2015  . Lactate dehydrogenase    Standing Status: Future     Number of Occurrences:      Standing Expiration Date: 08/02/2015  . Morphology    Standing Status: Future     Number of Occurrences:      Standing Expiration Date: 08/02/2015   All questions were answered. The patient knows to call the clinic with any problems, questions or concerns. No barriers to learning was detected. I spent 25 minutes counseling the patient face to face. The total time spent in the appointment was 30 minutes and more than 50% was on counseling and review of test results     [facility], [place], MD 06/29/2014 1:00 PM

## 2014-10-13 ENCOUNTER — Other Ambulatory Visit: Payer: Self-pay | Admitting: Cardiovascular Disease

## 2014-11-23 ENCOUNTER — Other Ambulatory Visit: Payer: Self-pay | Admitting: Cardiovascular Disease

## 2014-11-27 ENCOUNTER — Other Ambulatory Visit: Payer: Self-pay | Admitting: Cardiovascular Disease

## 2014-12-24 ENCOUNTER — Other Ambulatory Visit: Payer: Self-pay | Admitting: Cardiovascular Disease

## 2014-12-27 ENCOUNTER — Telehealth: Payer: Self-pay | Admitting: Hematology and Oncology

## 2014-12-27 ENCOUNTER — Other Ambulatory Visit (HOSPITAL_BASED_OUTPATIENT_CLINIC_OR_DEPARTMENT_OTHER): Payer: Medicare Other

## 2014-12-27 ENCOUNTER — Encounter: Payer: Self-pay | Admitting: Hematology and Oncology

## 2014-12-27 ENCOUNTER — Other Ambulatory Visit: Payer: Self-pay | Admitting: Lab

## 2014-12-27 ENCOUNTER — Ambulatory Visit (HOSPITAL_BASED_OUTPATIENT_CLINIC_OR_DEPARTMENT_OTHER): Payer: Medicare Other | Admitting: Hematology and Oncology

## 2014-12-27 VITALS — BP 133/58 | HR 65 | Temp 98.2°F | Resp 18 | Ht 73.0 in | Wt 191.1 lb

## 2014-12-27 DIAGNOSIS — D696 Thrombocytopenia, unspecified: Secondary | ICD-10-CM

## 2014-12-27 DIAGNOSIS — D630 Anemia in neoplastic disease: Secondary | ICD-10-CM | POA: Diagnosis not present

## 2014-12-27 DIAGNOSIS — D72819 Decreased white blood cell count, unspecified: Secondary | ICD-10-CM

## 2014-12-27 DIAGNOSIS — D46Z Other myelodysplastic syndromes: Secondary | ICD-10-CM

## 2014-12-27 DIAGNOSIS — M545 Low back pain, unspecified: Secondary | ICD-10-CM

## 2014-12-27 DIAGNOSIS — D4620 Refractory anemia with excess of blasts, unspecified: Secondary | ICD-10-CM

## 2014-12-27 DIAGNOSIS — M25562 Pain in left knee: Secondary | ICD-10-CM | POA: Insufficient documentation

## 2014-12-27 LAB — COMPREHENSIVE METABOLIC PANEL (CC13)
ALT: 11 U/L (ref 0–55)
ANION GAP: 7 meq/L (ref 3–11)
AST: 18 U/L (ref 5–34)
Albumin: 3.9 g/dL (ref 3.5–5.0)
Alkaline Phosphatase: 58 U/L (ref 40–150)
BILIRUBIN TOTAL: 0.78 mg/dL (ref 0.20–1.20)
BUN: 21.3 mg/dL (ref 7.0–26.0)
CO2: 27 meq/L (ref 22–29)
CREATININE: 1.2 mg/dL (ref 0.7–1.3)
Calcium: 9.7 mg/dL (ref 8.4–10.4)
Chloride: 109 mEq/L (ref 98–109)
EGFR: 65 mL/min/{1.73_m2} — ABNORMAL LOW (ref >90)
Glucose: 106 mg/dl (ref 70–140)
Potassium: 4.3 mEq/L (ref 3.5–5.1)
Sodium: 143 mEq/L (ref 136–145)
TOTAL PROTEIN: 7.1 g/dL (ref 6.4–8.3)

## 2014-12-27 LAB — CBC WITH DIFFERENTIAL/PLATELET
BASO%: 1 % (ref 0.0–2.0)
BASOS ABS: 0 10*3/uL (ref 0.0–0.1)
EOS%: 30.1 % — ABNORMAL HIGH (ref 0.0–7.0)
Eosinophils Absolute: 1.1 10*3/uL — ABNORMAL HIGH (ref 0.0–0.5)
HCT: 33 % — ABNORMAL LOW (ref 38.4–49.9)
HEMOGLOBIN: 10.8 g/dL — AB (ref 13.0–17.1)
LYMPH#: 0.6 10*3/uL — AB (ref 0.9–3.3)
LYMPH%: 15.3 % (ref 14.0–49.0)
MCH: 28.5 pg (ref 27.2–33.4)
MCHC: 32.7 g/dL (ref 32.0–36.0)
MCV: 87.1 fL (ref 79.3–98.0)
MONO#: 0.1 10*3/uL (ref 0.1–0.9)
MONO%: 3.2 % (ref 0.0–14.0)
NEUT%: 50.4 % (ref 39.0–75.0)
NEUTROS ABS: 1.9 10*3/uL (ref 1.5–6.5)
Platelets: 82 10*3/uL — ABNORMAL LOW (ref 140–400)
RBC: 3.78 10*6/uL — ABNORMAL LOW (ref 4.20–5.82)
RDW: 18.5 % — ABNORMAL HIGH (ref 11.0–14.6)
WBC: 3.7 10*3/uL — AB (ref 4.0–10.3)

## 2014-12-27 LAB — LACTATE DEHYDROGENASE (CC13): LDH: 242 U/L (ref 125–245)

## 2014-12-27 LAB — MORPHOLOGY: PLT EST: DECREASED

## 2014-12-27 MED ORDER — OXYCODONE HCL 5 MG PO TABS: 5.0000 mg | ORAL_TABLET | Freq: Four times a day (QID) | ORAL | Status: DC | PRN

## 2014-12-27 NOTE — Assessment & Plan Note (Signed)
This could be related to chronic arthritis. I gave him prescription of oxycodone. We discussed about the role of pain management in the oncology clinic. The patient agreed to be compliant with prescribed pain regimen and promised not to share the prescribed medications. Any lost medications or missed prescription will not be refilled sooner than anticipated time when the patient's prescription is expected to run out. We also discussed narcotics refill policy in the clinic.  The patient is educated to check the pill bottles in the middle of the week and ensure there is adequate supply to last through the weekend until next appointment or available business day.  The oncology service has a strict policy not to refill pain medications after business hours or the weekend.

## 2014-12-27 NOTE — Progress Notes (Signed)
[place] OFFICE PROGRESS NOTE  Patient Care Team: Lona Kettle, MD as PCP - General (Family Medicine)  SUMMARY OF ONCOLOGIC HISTORY:  DIAGNOSIS: Myelodysplastic syndrome, IPSS score intermediate risk with leukopenia and thrombocytopenia and blasts count less than 5%.  SUMMARY OF ONCOLOGIC HISTORY: He was noted to have mild plasmacytosis, eosinophilia and anemia. On March 2010, he had bone marrow aspirate and biopsy which showed dyspoietic changes less than 1% blasts. 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 Repeat bone marrow aspirate and biopsy dated 10/04/2013 showed eosinophilia and dyspoietic changes. He was placed on observation.  INTERVAL HISTORY: Please see below for problem oriented charting. The patient denies any recent signs or symptoms of bleeding such as spontaneous epistaxis, hematuria or hematochezia.  He feels well. Denies recent infection. He continued to take pain medicine 3-4 times a week for left knee pain.  REVIEW OF SYSTEMS:   Constitutional: Denies fevers, chills or abnormal weight loss Eyes: Denies blurriness of vision Ears, nose, mouth, throat, and face: Denies mucositis or sore throat Respiratory: Denies cough, dyspnea or wheezes Cardiovascular: Denies palpitation, chest discomfort or lower extremity swelling Gastrointestinal:  Denies nausea, heartburn or change in bowel habits Skin: Denies abnormal skin rashes Lymphatics: Denies new lymphadenopathy or easy bruising Neurological:Denies numbness, tingling or new weaknesses Behavioral/Psych: Mood is stable, no new changes  All other systems were reviewed with the patient and are negative.  I have reviewed the past medical history, past surgical history, social history and family history with the patient and they are unchanged from previous note.  ALLERGIES:  has No Known Allergies.  MEDICATIONS:  Current Outpatient Prescriptions   Medication Sig Dispense Refill  . allopurinol (ZYLOPRIM) 300 MG tablet Take 300 mg by mouth daily.      Marland Kitchen aspirin 81 MG tablet Take 1 tablet (81 mg total) by mouth daily.    Marland Kitchen atorvastatin (LIPITOR) 20 MG tablet TAKE 1 TABLET (20 MG TOTAL) BY MOUTH DAILY. 30 tablet 6  . CALCIUM PO Take by mouth.     . carvedilol (COREG) 25 MG tablet TAKE 1 TABLET (25 MG TOTAL) BY MOUTH 2 (TWO) TIMES DAILY. 60 tablet 6  . doxazosin (CARDURA) 4 MG tablet Take 4 mg by mouth at bedtime.      . furosemide (LASIX) 80 MG tablet Take 80 mg by mouth 2 (two) times daily.      Marland Kitchen losartan (COZAAR) 100 MG tablet Take 100 mg by mouth daily.      . metFORMIN (GLUMETZA) 1000 MG (MOD) 24 hr tablet Take 1,000 mg by mouth daily with breakfast.      . metoprolol (LOPRESSOR) 50 MG tablet Take 50 mg by mouth 2 (two) times daily.  4  . Multiple Vitamins-Minerals (MULTIVITAMIN WITH MINERALS) tablet Take 100 tablets by mouth daily.      Marland Kitchen NIFEdipine (ADALAT CC) 90 MG 24 hr tablet Take 90 mg by mouth daily.      Marland Kitchen oxyCODONE (OXY IR/ROXICODONE) 5 MG immediate release tablet Take 1 tablet (5 mg total) by mouth every 6 (six) hours as needed for severe pain. 60 tablet 0  . potassium chloride SA (K-DUR,KLOR-CON) 20 MEQ tablet Take 20 mEq by mouth 2 (two) times daily.       No current facility-administered medications for this visit.    PHYSICAL EXAMINATION: ECOG PERFORMANCE STATUS: 0 - Asymptomatic  Filed Vitals:   12/27/14 0913  BP: 133/58  Pulse: 65  Temp: 98.2 F (36.8 C)  Resp: 18  Filed Weights   12/27/14 0913  Weight: 191 lb 1.6 oz (86.682 kg)    GENERAL:alert, no distress and comfortable SKIN: skin color, texture, turgor are normal, no rashes or significant lesions EYES: normal, Conjunctiva are pink and non-injected, sclera clear OROPHARYNX:no exudate, no erythema and lips, buccal mucosa, and tongue normal  NECK: supple, thyroid normal size, non-tender, without nodularity LYMPH:  no palpable lymphadenopathy in the  cervical, axillary or inguinal LUNGS: clear to auscultation and percussion with normal breathing effort HEART: regular rate & rhythm and no murmurs and no lower extremity edema ABDOMEN:abdomen soft, non-tender and normal bowel sounds Musculoskeletal:no cyanosis of digits and no clubbing  NEURO: alert & oriented x 3 with fluent speech, no focal motor/sensory deficits  LABORATORY DATA:  I have reviewed the data as listed    Component Value Date/Time   NA 143 12/27/2014 0902   NA 140 10/13/2013 0901   K 4.3 12/27/2014 0902   K 3.7 10/13/2013 0901   CL 105 10/13/2013 0901   CL 109* 04/13/2012 0852   CO2 27 12/27/2014 0902   CO2 28 10/13/2013 0901   GLUCOSE 106 12/27/2014 0902   GLUCOSE 137* 10/13/2013 0901   GLUCOSE 120* 04/13/2012 0852   BUN 21.3 12/27/2014 0902   BUN 14 10/13/2013 0901   CREATININE 1.2 12/27/2014 0902   CREATININE 1.1 10/13/2013 0901   CALCIUM 9.7 12/27/2014 0902   CALCIUM 9.7 10/13/2013 0901   PROT 7.1 12/27/2014 0902   PROT 7.7 10/13/2013 0901   ALBUMIN 3.9 12/27/2014 0902   ALBUMIN 4.0 10/13/2013 0901   AST 18 12/27/2014 0902   AST 23 10/13/2013 0901   ALT 11 12/27/2014 0902   ALT 17 10/13/2013 0901   ALKPHOS 58 12/27/2014 0902   ALKPHOS 56 10/13/2013 0901   BILITOT 0.78 12/27/2014 0902   BILITOT 0.7 10/13/2013 0901   GFRNONAA 56* 11/30/2008 0510   GFRAA  11/30/2008 0510    >60        The eGFR has been calculated using the MDRD equation. This calculation has not been validated in all clinical situations. eGFR's persistently <60 mL/min signify possible Chronic Kidney Disease.    No results found for: SPEP, UPEP  Lab Results  Component Value Date   WBC 3.7* 12/27/2014   NEUTROABS 1.9 12/27/2014   HGB 10.8* 12/27/2014   HCT 33.0* 12/27/2014   MCV 87.1 12/27/2014   PLT 82* 12/27/2014      Chemistry      Component Value Date/Time   NA 143 12/27/2014 0902   NA 140 10/13/2013 0901   K 4.3 12/27/2014 0902   K 3.7 10/13/2013 0901   CL  105 10/13/2013 0901   CL 109* 04/13/2012 0852   CO2 27 12/27/2014 0902   CO2 28 10/13/2013 0901   BUN 21.3 12/27/2014 0902   BUN 14 10/13/2013 0901   CREATININE 1.2 12/27/2014 0902   CREATININE 1.1 10/13/2013 0901      Component Value Date/Time   CALCIUM 9.7 12/27/2014 0902   CALCIUM 9.7 10/13/2013 0901   ALKPHOS 58 12/27/2014 0902   ALKPHOS 56 10/13/2013 0901   AST 18 12/27/2014 0902   AST 23 10/13/2013 0901   ALT 11 12/27/2014 0902   ALT 17 10/13/2013 0901   BILITOT 0.78 12/27/2014 0902   BILITOT 0.7 10/13/2013 0901       RADIOGRAPHIC STUDIES: I have personally reviewed the radiological images as listed and agreed with the findings in the report. No results found.   ASSESSMENT &  PLAN:  MDS (myelodysplastic syndrome), low grade The patient has stable disease I had a long discussion with the patient regarding treatment options including supportive therapy only versus chemotherapy. The patient is not keen to undergo chemotherapy. I will see him back in 6 months for repeat history, physical examination and blood work.  Anemia in neoplastic disease This is likely anemia related to underlying disease. The patient denies recent history of bleeding such as epistaxis, hematuria or hematochezia. He is asymptomatic from the anemia. We will observe for now.  He does not require transfusion now.   Leukopenia The patient is mildly neutropenic. Continue close monitoring. He is not symptomatic    Thrombocytopenia This is related to his underlying disease. It is mild and there is little change compared from previous platelet count. The patient denies recent history of bleeding such as epistaxis, hematuria or hematochezia. He is asymptomatic from the thrombocytopenia. I will observe for now.   Left knee pain This could be related to chronic arthritis. I gave him prescription of oxycodone. We discussed about the role of pain management in the oncology clinic. The patient agreed to be  compliant with prescribed pain regimen and promised not to share the prescribed medications. Any lost medications or missed prescription will not be refilled sooner than anticipated time when the patient's prescription is expected to run out. We also discussed narcotics refill policy in the clinic.  The patient is educated to check the pill bottles in the middle of the week and ensure there is adequate supply to last through the weekend until next appointment or available business day.  The oncology service has a strict policy not to refill pain medications after business hours or the weekend.     Orders Placed This Encounter  Procedures  . CBC with Differential/Platelet    Standing Status: Future     Number of Occurrences:      Standing Expiration Date: 01/31/2016  . Comprehensive metabolic panel    Standing Status: Future     Number of Occurrences:      Standing Expiration Date: 01/31/2016  . Morphology    Standing Status: Future     Number of Occurrences:      Standing Expiration Date: 01/31/2016   All questions were answered. The patient knows to call the clinic with any problems, questions or concerns. No barriers to learning was detected. I spent 15 minutes counseling the patient face to face. The total time spent in the appointment was 20 minutes and more than 50% was on counseling and review of test results     [facility], [place], MD 12/27/2014 9:55 AM

## 2014-12-27 NOTE — Assessment & Plan Note (Signed)
The patient is mildly neutropenic. Continue close monitoring. He is not symptomatic

## 2014-12-27 NOTE — Assessment & Plan Note (Signed)
This is likely anemia related to underlying disease. The patient denies recent history of bleeding such as epistaxis, hematuria or hematochezia. He is asymptomatic from the anemia. We will observe for now.  He does not require transfusion now.

## 2014-12-27 NOTE — Assessment & Plan Note (Signed)
The patient has stable disease I had a long discussion with the patient regarding treatment options including supportive therapy only versus chemotherapy. The patient is not keen to undergo chemotherapy. I will see him back in 6 months for repeat history, physical examination and blood work.

## 2014-12-27 NOTE — Assessment & Plan Note (Signed)
This is related to his underlying disease. It is mild and there is little change compared from previous platelet count. The patient denies recent history of bleeding such as epistaxis, hematuria or hematochezia. He is asymptomatic from the thrombocytopenia. I will observe for now.

## 2014-12-27 NOTE — Telephone Encounter (Signed)
Gave and printed appt sched and avs fo rpt; for NOV  °

## 2014-12-29 ENCOUNTER — Other Ambulatory Visit: Payer: Self-pay | Admitting: Cardiovascular Disease

## 2015-03-23 ENCOUNTER — Ambulatory Visit
Admission: RE | Admit: 2015-03-23 | Discharge: 2015-03-23 | Disposition: A | Discharge status: Home / Self Care | Payer: Medicare Other | Source: Ambulatory Visit | Attending: Family Medicine | Admitting: Family Medicine

## 2015-03-23 ENCOUNTER — Other Ambulatory Visit: Payer: Self-pay | Admitting: Family Medicine

## 2015-03-23 DIAGNOSIS — R52 Pain, unspecified: Secondary | ICD-10-CM

## 2015-05-16 ENCOUNTER — Ambulatory Visit (HOSPITAL_BASED_OUTPATIENT_CLINIC_OR_DEPARTMENT_OTHER): Payer: Medicare Other | Admitting: Hematology and Oncology

## 2015-05-16 ENCOUNTER — Telehealth: Payer: Self-pay | Admitting: Hematology and Oncology

## 2015-05-16 ENCOUNTER — Encounter: Payer: Self-pay | Admitting: Hematology and Oncology

## 2015-05-16 ENCOUNTER — Other Ambulatory Visit (HOSPITAL_BASED_OUTPATIENT_CLINIC_OR_DEPARTMENT_OTHER): Payer: Medicare Other

## 2015-05-16 VITALS — BP 147/69 | HR 63 | Temp 98.3°F | Resp 18 | Ht 73.0 in | Wt 194.9 lb

## 2015-05-16 DIAGNOSIS — M545 Low back pain, unspecified: Secondary | ICD-10-CM

## 2015-05-16 DIAGNOSIS — D469 Myelodysplastic syndrome, unspecified: Secondary | ICD-10-CM | POA: Diagnosis not present

## 2015-05-16 DIAGNOSIS — D61818 Other pancytopenia: Secondary | ICD-10-CM | POA: Diagnosis not present

## 2015-05-16 DIAGNOSIS — M25562 Pain in left knee: Secondary | ICD-10-CM | POA: Diagnosis not present

## 2015-05-16 DIAGNOSIS — D4620 Refractory anemia with excess of blasts, unspecified: Secondary | ICD-10-CM

## 2015-05-16 LAB — COMPREHENSIVE METABOLIC PANEL (CC13)
ALBUMIN: 3.8 g/dL (ref 3.5–5.0)
ALK PHOS: 57 U/L (ref 40–150)
ALT: 13 U/L (ref 0–55)
AST: 21 U/L (ref 5–34)
Anion Gap: 9 mEq/L (ref 3–11)
BUN: 27.1 mg/dL — AB (ref 7.0–26.0)
CALCIUM: 9.5 mg/dL (ref 8.4–10.4)
CO2: 24 mEq/L (ref 22–29)
CREATININE: 1.4 mg/dL — AB (ref 0.7–1.3)
Chloride: 111 mEq/L — ABNORMAL HIGH (ref 98–109)
EGFR: 54 mL/min/{1.73_m2} — ABNORMAL LOW (ref >90)
GLUCOSE: 109 mg/dL (ref 70–140)
POTASSIUM: 4.7 meq/L (ref 3.5–5.1)
Sodium: 143 mEq/L (ref 136–145)
TOTAL PROTEIN: 7.2 g/dL (ref 6.4–8.3)
Total Bilirubin: 0.55 mg/dL (ref 0.20–1.20)

## 2015-05-16 LAB — MORPHOLOGY: PLT EST: DECREASED

## 2015-05-16 LAB — CBC WITH DIFFERENTIAL/PLATELET
BASO%: 1.4 % (ref 0.0–2.0)
BASOS ABS: 0.1 10*3/uL (ref 0.0–0.1)
EOS%: 37.2 % — ABNORMAL HIGH (ref 0.0–7.0)
Eosinophils Absolute: 1.4 10*3/uL — ABNORMAL HIGH (ref 0.0–0.5)
HEMATOCRIT: 33.6 % — AB (ref 38.4–49.9)
HGB: 10.8 g/dL — ABNORMAL LOW (ref 13.0–17.1)
LYMPH%: 16.2 % (ref 14.0–49.0)
MCH: 27.9 pg (ref 27.2–33.4)
MCHC: 32.1 g/dL (ref 32.0–36.0)
MCV: 86.9 fL (ref 79.3–98.0)
MONO#: 0.2 10*3/uL (ref 0.1–0.9)
MONO%: 4.4 % (ref 0.0–14.0)
NEUT%: 40.8 % (ref 39.0–75.0)
NEUTROS ABS: 1.5 10*3/uL (ref 1.5–6.5)
Platelets: 91 10*3/uL — ABNORMAL LOW (ref 140–400)
RBC: 3.87 10*6/uL — ABNORMAL LOW (ref 4.20–5.82)
RDW: 19.5 % — ABNORMAL HIGH (ref 11.0–14.6)
WBC: 3.7 10*3/uL — AB (ref 4.0–10.3)
lymph#: 0.6 10*3/uL — ABNORMAL LOW (ref 0.9–3.3)

## 2015-05-16 MED ORDER — OXYCODONE HCL 5 MG PO TABS: 5.0000 mg | ORAL_TABLET | Freq: Four times a day (QID) | ORAL | Status: DC | PRN

## 2015-05-16 NOTE — Assessment & Plan Note (Signed)
This could be related to chronic arthritis. I gave him prescription of oxycodone. We discussed about the role of pain management in the oncology clinic. The patient agreed to be compliant with prescribed pain regimen and promised not to share the prescribed medications. Any lost medications or missed prescription will not be refilled sooner than anticipated time when the patient's prescription is expected to run out. We also discussed narcotics refill policy in the clinic.  The patient is educated to check the pill bottles in the middle of the week and ensure there is adequate supply to last through the weekend until next appointment or available business day.  The oncology service has a strict policy not to refill pain medications after business hours or the weekend.   

## 2015-05-16 NOTE — Telephone Encounter (Signed)
per pof to sch pt appt-gave pt copy of avs °

## 2015-05-16 NOTE — Assessment & Plan Note (Signed)
The patient has stable disease We had a long discussion in the past with the patient regarding treatment options including supportive therapy only versus chemotherapy. The patient is not keen to undergo chemotherapy. I will see him back in 6 months for repeat history, physical examination and blood work.

## 2015-05-16 NOTE — Assessment & Plan Note (Signed)
This is likely related to underlying disease. The patient denies recent history of bleeding such as epistaxis, hematuria or hematochezia. He had no recent infection. He is asymptomatic from the pancytopenia. We will observe for now.

## 2015-05-16 NOTE — Progress Notes (Signed)
[place] OFFICE PROGRESS NOTE  Patient Care Team: Lona Kettle, MD as PCP - General (Family Medicine)  SUMMARY OF ONCOLOGIC HISTORY:   MDS (myelodysplastic syndrome), low grade ([place])   09/08/2008 Bone Marrow Biopsy Case #: [id number] BM biopsy showed HYPERCELLULAR MARROW FOR AGE WITH DYSPOIETIC CHANGES.    10/14/2013 Bone Marrow Biopsy Accession: [id number] biopsy confirmed dyspoetic changes suspicious for MDS/MPN with eosinophilia    INTERVAL HISTORY: Please see below for problem oriented charting.  he feels well. Denies recent infection. His energy level is fair. The patient denies any recent signs or symptoms of bleeding such as spontaneous epistaxis, hematuria or hematochezia.  He has chronic knee pain and take oxycodone several times a week.  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  REVIEW OF SYSTEMS:   Constitutional: Denies fevers, chills or abnormal weight loss Eyes: Denies blurriness of vision Ears, nose, mouth, throat, and face: Denies mucositis or sore throat Respiratory: Denies cough, dyspnea or wheezes Cardiovascular: Denies palpitation, chest discomfort or lower extremity swelling Gastrointestinal:  Denies nausea, heartburn or change in bowel habits Skin: Denies abnormal skin rashes Lymphatics: Denies new lymphadenopathy or easy bruising Neurological:Denies numbness, tingling or new weaknesses Behavioral/Psych: Mood is stable, no new changes  All other systems were reviewed with the patient and are negative.  I have reviewed the past medical history, past surgical history, social history and family history with the patient and they are unchanged from previous note.  ALLERGIES:  has No Known Allergies.  MEDICATIONS:  Current Outpatient Prescriptions  Medication Sig Dispense Refill  . allopurinol (ZYLOPRIM) 300 MG tablet Take 300 mg by mouth daily.      Marland Kitchen aspirin 81 MG tablet Take 1 tablet (81 mg total) by mouth daily.    Marland Kitchen  atorvastatin (LIPITOR) 20 MG tablet TAKE 1 TABLET (20 MG TOTAL) BY MOUTH DAILY. 30 tablet 6  . CALCIUM PO Take by mouth.     . carvedilol (COREG) 25 MG tablet TAKE 1 TABLET (25 MG TOTAL) BY MOUTH 2 (TWO) TIMES DAILY. 60 tablet 6  . doxazosin (CARDURA) 4 MG tablet Take 4 mg by mouth at bedtime.      . furosemide (LASIX) 80 MG tablet Take 80 mg by mouth 2 (two) times daily.      Marland Kitchen losartan (COZAAR) 100 MG tablet Take 100 mg by mouth daily.      . meloxicam (MOBIC) 7.5 MG tablet Take 7.5 mg by mouth daily.  6  . metoprolol (LOPRESSOR) 50 MG tablet Take 50 mg by mouth 2 (two) times daily.  4  . Multiple Vitamins-Minerals (MULTIVITAMIN WITH MINERALS) tablet Take 100 tablets by mouth daily.      Marland Kitchen NIFEdipine (ADALAT CC) 90 MG 24 hr tablet Take 90 mg by mouth daily.      Marland Kitchen oxyCODONE (OXY IR/ROXICODONE) 5 MG immediate release tablet Take 1 tablet (5 mg total) by mouth every 6 (six) hours as needed for severe pain. 90 tablet 0  . potassium chloride SA (K-DUR,KLOR-CON) 20 MEQ tablet Take 20 mEq by mouth 2 (two) times daily.       No current facility-administered medications for this visit.    PHYSICAL EXAMINATION: ECOG PERFORMANCE STATUS: 0 - Asymptomatic  Filed Vitals:   05/16/15 1016  BP: 147/69  Pulse: 63  Temp: 98.3 F (36.8 C)  Resp: 18   Filed Weights   05/16/15 1016  Weight: 194 lb 14.4 oz (88.406 kg)    GENERAL:alert, no  distress and comfortable SKIN: skin color, texture, turgor are normal, no rashes or significant lesions EYES: normal, Conjunctiva are pink and non-injected, sclera clear Musculoskeletal:no cyanosis of digits and no clubbing  NEURO: alert & oriented x 3 with fluent speech, no focal motor/sensory deficits  LABORATORY DATA:  I have reviewed the data as listed    Component Value Date/Time   NA 143 05/16/2015 0956   NA 140 10/13/2013 0901   K 4.7 05/16/2015 0956   K 3.7 10/13/2013 0901   CL 105 10/13/2013 0901   CL 109* 04/13/2012 0852   CO2 24 05/16/2015  0956   CO2 28 10/13/2013 0901   GLUCOSE 109 05/16/2015 0956   GLUCOSE 137* 10/13/2013 0901   GLUCOSE 120* 04/13/2012 0852   BUN 27.1* 05/16/2015 0956   BUN 14 10/13/2013 0901   CREATININE 1.4* 05/16/2015 0956   CREATININE 1.1 10/13/2013 0901   CALCIUM 9.5 05/16/2015 0956   CALCIUM 9.7 10/13/2013 0901   PROT 7.2 05/16/2015 0956   PROT 7.7 10/13/2013 0901   ALBUMIN 3.8 05/16/2015 0956   ALBUMIN 4.0 10/13/2013 0901   AST 21 05/16/2015 0956   AST 23 10/13/2013 0901   ALT 13 05/16/2015 0956   ALT 17 10/13/2013 0901   ALKPHOS 57 05/16/2015 0956   ALKPHOS 56 10/13/2013 0901   BILITOT 0.55 05/16/2015 0956   BILITOT 0.7 10/13/2013 0901   GFRNONAA 56* 11/30/2008 0510   GFRAA  11/30/2008 0510    >60        The eGFR has been calculated using the MDRD equation. This calculation has not been validated in all clinical situations. eGFR's persistently <60 mL/min signify possible Chronic Kidney Disease.    No results found for: SPEP, UPEP  Lab Results  Component Value Date   WBC 3.7* 05/16/2015   NEUTROABS 1.5 05/16/2015   HGB 10.8* 05/16/2015   HCT 33.6* 05/16/2015   MCV 86.9 05/16/2015   PLT 91* 05/16/2015      Chemistry      Component Value Date/Time   NA 143 05/16/2015 0956   NA 140 10/13/2013 0901   K 4.7 05/16/2015 0956   K 3.7 10/13/2013 0901   CL 105 10/13/2013 0901   CL 109* 04/13/2012 0852   CO2 24 05/16/2015 0956   CO2 28 10/13/2013 0901   BUN 27.1* 05/16/2015 0956   BUN 14 10/13/2013 0901   CREATININE 1.4* 05/16/2015 0956   CREATININE 1.1 10/13/2013 0901      Component Value Date/Time   CALCIUM 9.5 05/16/2015 0956   CALCIUM 9.7 10/13/2013 0901   ALKPHOS 57 05/16/2015 0956   ALKPHOS 56 10/13/2013 0901   AST 21 05/16/2015 0956   AST 23 10/13/2013 0901   ALT 13 05/16/2015 0956   ALT 17 10/13/2013 0901   BILITOT 0.55 05/16/2015 0956   BILITOT 0.7 10/13/2013 0901      ASSESSMENT & PLAN:  MDS (myelodysplastic syndrome), low grade The patient has  stable disease We had a long discussion in the past with the patient regarding treatment options including supportive therapy only versus chemotherapy. The patient is not keen to undergo chemotherapy. I will see him back in 6 months for repeat history, physical examination and blood work.    Pancytopenia ([place]) This is likely related to underlying disease. The patient denies recent history of bleeding such as epistaxis, hematuria or hematochezia. He had no recent infection. He is asymptomatic from the pancytopenia. We will observe for now.     Left knee pain  This could be related to chronic arthritis. I gave him prescription of oxycodone. We discussed about the role of pain management in the oncology clinic. The patient agreed to be compliant with prescribed pain regimen and promised not to share the prescribed medications. Any lost medications or missed prescription will not be refilled sooner than anticipated time when the patient's prescription is expected to run out. We also discussed narcotics refill policy in the clinic.  The patient is educated to check the pill bottles in the middle of the week and ensure there is adequate supply to last through the weekend until next appointment or available business day.  The oncology service has a strict policy not to refill pain medications after business hours or the weekend.     Orders Placed This Encounter  Procedures  . CBC with Differential/Platelet    Standing Status: Future     Number of Occurrences:      Standing Expiration Date: 06/19/2016  . Morphology    Standing Status: Future     Number of Occurrences:      Standing Expiration Date: 06/19/2016   All questions were answered. The patient knows to call the clinic with any problems, questions or concerns. No barriers to learning was detected. I spent 15 minutes counseling the patient face to face. The total time spent in the appointment was 20 minutes and more than 50% was on  counseling and review of test results     [facility], [place], MD 05/16/2015 10:54 AM

## 2015-10-16 ENCOUNTER — Telehealth: Payer: Self-pay | Admitting: Hematology and Oncology

## 2015-10-16 ENCOUNTER — Other Ambulatory Visit (HOSPITAL_BASED_OUTPATIENT_CLINIC_OR_DEPARTMENT_OTHER): Payer: Medicare Other

## 2015-10-16 ENCOUNTER — Ambulatory Visit (HOSPITAL_BASED_OUTPATIENT_CLINIC_OR_DEPARTMENT_OTHER): Payer: Medicare Other | Admitting: Hematology and Oncology

## 2015-10-16 ENCOUNTER — Encounter: Payer: Self-pay | Admitting: Hematology and Oncology

## 2015-10-16 VITALS — BP 133/56 | HR 62 | Temp 97.6°F | Resp 18 | Ht 73.0 in | Wt 192.8 lb

## 2015-10-16 DIAGNOSIS — D4620 Refractory anemia with excess of blasts, unspecified: Secondary | ICD-10-CM | POA: Diagnosis not present

## 2015-10-16 DIAGNOSIS — M545 Low back pain, unspecified: Secondary | ICD-10-CM

## 2015-10-16 DIAGNOSIS — D61818 Other pancytopenia: Secondary | ICD-10-CM | POA: Diagnosis not present

## 2015-10-16 LAB — CBC WITH DIFFERENTIAL/PLATELET
BASO%: 0.6 % (ref 0.0–2.0)
Basophils Absolute: 0 10*3/uL (ref 0.0–0.1)
EOS%: 29.8 % — ABNORMAL HIGH (ref 0.0–7.0)
Eosinophils Absolute: 1.1 10*3/uL — ABNORMAL HIGH (ref 0.0–0.5)
HCT: 33.7 % — ABNORMAL LOW (ref 38.4–49.9)
HGB: 10.8 g/dL — ABNORMAL LOW (ref 13.0–17.1)
LYMPH#: 0.6 10*3/uL — AB (ref 0.9–3.3)
LYMPH%: 16.2 % (ref 14.0–49.0)
MCH: 27.9 pg (ref 27.2–33.4)
MCHC: 32 g/dL (ref 32.0–36.0)
MCV: 87.1 fL (ref 79.3–98.0)
MONO#: 0.2 10*3/uL (ref 0.1–0.9)
MONO%: 4 % (ref 0.0–14.0)
NEUT%: 49.4 % (ref 39.0–75.0)
NEUTROS ABS: 1.9 10*3/uL (ref 1.5–6.5)
PLATELETS: 78 10*3/uL — AB (ref 140–400)
RBC: 3.87 10*6/uL — AB (ref 4.20–5.82)
RDW: 19.5 % — ABNORMAL HIGH (ref 11.0–14.6)
WBC: 3.8 10*3/uL — ABNORMAL LOW (ref 4.0–10.3)

## 2015-10-16 LAB — MORPHOLOGY: PLT EST: DECREASED

## 2015-10-16 MED ORDER — OXYCODONE HCL 5 MG PO TABS
5.0000 mg | ORAL_TABLET | Freq: Four times a day (QID) | ORAL | Status: DC | PRN
Start: 2015-10-16 — End: 2016-03-18

## 2015-10-16 NOTE — Assessment & Plan Note (Signed)
This is likely related to underlying disease. The patient denies recent history of bleeding such as epistaxis, hematuria or hematochezia. He had no recent infection. He is asymptomatic from the pancytopenia. We will observe for now.

## 2015-10-16 NOTE — Telephone Encounter (Signed)
Gave and printed appt sched and avs for pt for Sept °

## 2015-10-16 NOTE — Assessment & Plan Note (Signed)
The patient has stable disease We had a long discussion in the past with the patient regarding treatment options including supportive therapy only versus chemotherapy. The patient is not keen to undergo chemotherapy. I will see him back in 5 months for repeat history, physical examination and blood work.

## 2015-10-16 NOTE — Assessment & Plan Note (Signed)
This could be related to chronic arthritis. I gave him prescription of oxycodone. We discussed about the role of pain management in the oncology clinic. The patient agreed to be compliant with prescribed pain regimen and promised not to share the prescribed medications. Any lost medications or missed prescription will not be refilled sooner than anticipated time when the patient's prescription is expected to run out. We also discussed narcotics refill policy in the clinic.  The patient is educated to check the pill bottles in the middle of the week and ensure there is adequate supply to last through the weekend until next appointment or available business day.  The oncology service has a strict policy not to refill pain medications after business hours or the weekend.   

## 2015-10-16 NOTE — Progress Notes (Signed)
[place] OFFICE PROGRESS NOTE  Patient Care Team: Lona Kettle, MD as PCP - General (Family Medicine)  SUMMARY OF ONCOLOGIC HISTORY:   MDS (myelodysplastic syndrome), low grade ([place])   09/08/2008 Bone Marrow Biopsy Case #: [id number] BM biopsy showed HYPERCELLULAR MARROW FOR AGE WITH DYSPOIETIC CHANGES.    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 He feels well. He still struggles to take care of his wife who is debilitated with the help from his daughter. Denies recent infection. The patient denies any recent signs or symptoms of bleeding such as spontaneous epistaxis, hematuria or hematochezia. He continues to have chronic joint pain affecting his back and his knees. He continues to take oxycodone once or twice a week as prescribed.  REVIEW OF SYSTEMS:   Constitutional: Denies fevers, chills or abnormal weight loss Eyes: Denies blurriness of vision Ears, nose, mouth, throat, and face: Denies mucositis or sore throat Respiratory: Denies cough, dyspnea or wheezes Cardiovascular: Denies palpitation, chest discomfort or lower extremity swelling Gastrointestinal:  Denies nausea, heartburn or change in bowel habits Skin: Denies abnormal skin rashes Lymphatics: Denies new lymphadenopathy or easy bruising Neurological:Denies numbness, tingling or new weaknesses Behavioral/Psych: Mood is stable, no new changes  All other systems were reviewed with the patient and are negative.  I have reviewed the past medical history, past surgical history, social history and family history with the patient and they are unchanged from previous note.  ALLERGIES:  has No Known Allergies.  MEDICATIONS:  Current Outpatient Prescriptions  Medication Sig Dispense Refill  . allopurinol (ZYLOPRIM) 300 MG tablet Take 300 mg by mouth daily.       Marland Kitchen aspirin 81 MG tablet Take 1 tablet (81 mg total) by mouth daily.    Marland Kitchen atorvastatin (LIPITOR) 20 MG tablet TAKE 1 TABLET (20 MG TOTAL) BY MOUTH DAILY. 30 tablet 6  . CALCIUM PO Take by mouth.     . carvedilol (COREG) 25 MG tablet TAKE 1 TABLET (25 MG TOTAL) BY MOUTH 2 (TWO) TIMES DAILY. 60 tablet 6  . doxazosin (CARDURA) 4 MG tablet Take 4 mg by mouth at bedtime.      . furosemide (LASIX) 80 MG tablet Take 80 mg by mouth 2 (two) times daily.      Marland Kitchen losartan (COZAAR) 100 MG tablet Take 100 mg by mouth daily.      . meloxicam (MOBIC) 7.5 MG tablet Take 7.5 mg by mouth daily.  6  . metoprolol (LOPRESSOR) 50 MG tablet Take 50 mg by mouth 2 (two) times daily.  4  . Multiple Vitamins-Minerals (MULTIVITAMIN WITH MINERALS) tablet Take 100 tablets by mouth daily.      Marland Kitchen NIFEdipine (ADALAT CC) 90 MG 24 hr tablet Take 90 mg by mouth daily.      Marland Kitchen oxyCODONE (OXY IR/ROXICODONE) 5 MG immediate release tablet Take 1 tablet (5 mg total) by mouth every 6 (six) hours as needed for severe pain. 90 tablet 0  . potassium chloride SA (K-DUR,KLOR-CON) 20 MEQ tablet Take 20 mEq by mouth 2 (two) times daily.       No current facility-administered medications for this visit.    PHYSICAL EXAMINATION: ECOG PERFORMANCE STATUS: 1 - Symptomatic but completely ambulatory  Filed Vitals:   10/16/15 0917  BP: 133/56  Pulse: 62  Temp: 97.6 F (36.4 C)  Resp: 18   Filed Weights  10/16/15 0917  Weight: 192 lb 12.8 oz (87.454 kg)    GENERAL:alert, no distress and comfortable SKIN: skin color, texture, turgor are normal, no rashes or significant lesions EYES: normal, Conjunctiva are pink and non-injected, sclera clear OROPHARYNX:no exudate, no erythema and lips, buccal mucosa, and tongue normal  NECK: supple, thyroid normal size, non-tender, without nodularity LYMPH:  no palpable lymphadenopathy in the cervical, axillary or inguinal LUNGS: clear to auscultation and percussion with normal breathing effort HEART:  regular rate & rhythm and no murmurs With moderate bilateral lower extremity edema ABDOMEN:abdomen soft, non-tender and normal bowel sounds Musculoskeletal:no cyanosis of digits and no clubbing  NEURO: alert & oriented x 3 with fluent speech, no focal motor/sensory deficits  LABORATORY DATA:  I have reviewed the data as listed    Component Value Date/Time   NA 143 05/16/2015 0956   NA 140 10/13/2013 0901   K 4.7 05/16/2015 0956   K 3.7 10/13/2013 0901   CL 105 10/13/2013 0901   CL 109* 04/13/2012 0852   CO2 24 05/16/2015 0956   CO2 28 10/13/2013 0901   GLUCOSE 109 05/16/2015 0956   GLUCOSE 137* 10/13/2013 0901   GLUCOSE 120* 04/13/2012 0852   BUN 27.1* 05/16/2015 0956   BUN 14 10/13/2013 0901   CREATININE 1.4* 05/16/2015 0956   CREATININE 1.1 10/13/2013 0901   CALCIUM 9.5 05/16/2015 0956   CALCIUM 9.7 10/13/2013 0901   PROT 7.2 05/16/2015 0956   PROT 7.7 10/13/2013 0901   ALBUMIN 3.8 05/16/2015 0956   ALBUMIN 4.0 10/13/2013 0901   AST 21 05/16/2015 0956   AST 23 10/13/2013 0901   ALT 13 05/16/2015 0956   ALT 17 10/13/2013 0901   ALKPHOS 57 05/16/2015 0956   ALKPHOS 56 10/13/2013 0901   BILITOT 0.55 05/16/2015 0956   BILITOT 0.7 10/13/2013 0901   GFRNONAA 56* 11/30/2008 0510   GFRAA  11/30/2008 0510    >60        The eGFR has been calculated using the MDRD equation. This calculation has not been validated in all clinical situations. eGFR's persistently <60 mL/min signify possible Chronic Kidney Disease.    No results found for: SPEP, UPEP  Lab Results  Component Value Date   WBC 3.8* 10/16/2015   NEUTROABS 1.9 10/16/2015   HGB 10.8* 10/16/2015   HCT 33.7* 10/16/2015   MCV 87.1 10/16/2015   PLT 78* 10/16/2015      Chemistry      Component Value Date/Time   NA 143 05/16/2015 0956   NA 140 10/13/2013 0901   K 4.7 05/16/2015 0956   K 3.7 10/13/2013 0901   CL 105 10/13/2013 0901   CL 109* 04/13/2012 0852   CO2 24 05/16/2015 0956   CO2 28 10/13/2013  0901   BUN 27.1* 05/16/2015 0956   BUN 14 10/13/2013 0901   CREATININE 1.4* 05/16/2015 0956   CREATININE 1.1 10/13/2013 0901      Component Value Date/Time   CALCIUM 9.5 05/16/2015 0956   CALCIUM 9.7 10/13/2013 0901   ALKPHOS 57 05/16/2015 0956   ALKPHOS 56 10/13/2013 0901   AST 21 05/16/2015 0956   AST 23 10/13/2013 0901   ALT 13 05/16/2015 0956   ALT 17 10/13/2013 0901   BILITOT 0.55 05/16/2015 0956   BILITOT 0.7 10/13/2013 0901      ASSESSMENT & PLAN:  MDS (myelodysplastic syndrome), low grade The patient has stable disease We had a long discussion in the past with the patient regarding treatment options including  supportive therapy only versus chemotherapy. The patient is not keen to undergo chemotherapy. I will see him back in 5 months for repeat history, physical examination and blood work.  Pancytopenia ([place]) This is likely related to underlying disease. The patient denies recent history of bleeding such as epistaxis, hematuria or hematochezia. He had no recent infection. He is asymptomatic from the pancytopenia. We will observe for now.    Midline low back pain without sciatica This could be related to chronic arthritis. I gave him prescription of oxycodone. We discussed about the role of pain management in the oncology clinic. The patient agreed to be compliant with prescribed pain regimen and promised not to share the prescribed medications. Any lost medications or missed prescription will not be refilled sooner than anticipated time when the patient's prescription is expected to run out. We also discussed narcotics refill policy in the clinic.  The patient is educated to check the pill bottles in the middle of the week and ensure there is adequate supply to last through the weekend until next appointment or available business day.  The oncology service has a strict policy not to refill pain medications after business hours or the weekend.    No orders of the defined  types were placed in this encounter.   All questions were answered. The patient knows to call the clinic with any problems, questions or concerns. No barriers to learning was detected. I spent 15 minutes counseling the patient face to face. The total time spent in the appointment was 20 minutes and more than 50% was on counseling and review of test results     [facility], [place], MD 10/16/2015 9:24 AM

## 2016-03-15 ENCOUNTER — Other Ambulatory Visit: Payer: Self-pay | Admitting: Hematology and Oncology

## 2016-03-15 DIAGNOSIS — D4620 Refractory anemia with excess of blasts, unspecified: Secondary | ICD-10-CM

## 2016-03-18 ENCOUNTER — Other Ambulatory Visit (HOSPITAL_BASED_OUTPATIENT_CLINIC_OR_DEPARTMENT_OTHER): Payer: Medicare Other

## 2016-03-18 ENCOUNTER — Encounter: Payer: Self-pay | Admitting: Hematology and Oncology

## 2016-03-18 ENCOUNTER — Telehealth: Payer: Self-pay | Admitting: Hematology and Oncology

## 2016-03-18 ENCOUNTER — Ambulatory Visit (HOSPITAL_BASED_OUTPATIENT_CLINIC_OR_DEPARTMENT_OTHER): Payer: Medicare Other | Admitting: Hematology and Oncology

## 2016-03-18 VITALS — BP 117/57 | HR 64 | Temp 98.3°F | Resp 18 | Ht 73.0 in | Wt 198.7 lb

## 2016-03-18 DIAGNOSIS — M25562 Pain in left knee: Secondary | ICD-10-CM

## 2016-03-18 DIAGNOSIS — D4620 Refractory anemia with excess of blasts, unspecified: Secondary | ICD-10-CM

## 2016-03-18 DIAGNOSIS — D61818 Other pancytopenia: Secondary | ICD-10-CM

## 2016-03-18 DIAGNOSIS — Z23 Encounter for immunization: Secondary | ICD-10-CM | POA: Diagnosis not present

## 2016-03-18 DIAGNOSIS — M545 Low back pain, unspecified: Secondary | ICD-10-CM

## 2016-03-18 LAB — CBC WITH DIFFERENTIAL/PLATELET
BASO%: 1.1 % (ref 0.0–2.0)
BASOS ABS: 0 10*3/uL (ref 0.0–0.1)
EOS ABS: 0.7 10*3/uL — AB (ref 0.0–0.5)
EOS%: 22.6 % — ABNORMAL HIGH (ref 0.0–7.0)
HEMATOCRIT: 32.5 % — AB (ref 38.4–49.9)
HEMOGLOBIN: 10.7 g/dL — AB (ref 13.0–17.1)
LYMPH#: 0.6 10*3/uL — AB (ref 0.9–3.3)
LYMPH%: 18 % (ref 14.0–49.0)
MCH: 28.3 pg (ref 27.2–33.4)
MCHC: 32.9 g/dL (ref 32.0–36.0)
MCV: 86.1 fL (ref 79.3–98.0)
MONO#: 0.1 10*3/uL (ref 0.1–0.9)
MONO%: 3.6 % (ref 0.0–14.0)
NEUT#: 1.7 10*3/uL (ref 1.5–6.5)
NEUT%: 54.7 % (ref 39.0–75.0)
PLATELETS: 80 10*3/uL — AB (ref 140–400)
RBC: 3.77 10*6/uL — AB (ref 4.20–5.82)
RDW: 20.5 % — AB (ref 11.0–14.6)
WBC: 3.2 10*3/uL — ABNORMAL LOW (ref 4.0–10.3)

## 2016-03-18 MED ORDER — OXYCODONE HCL 5 MG PO TABS: 5.0000 mg | ORAL_TABLET | Freq: Four times a day (QID) | ORAL | 0 refills | Status: DC | PRN

## 2016-03-18 MED ORDER — INFLUENZA VAC SPLIT QUAD 0.5 ML IM SUSY
0.5000 mL | PREFILLED_SYRINGE | Freq: Once | INTRAMUSCULAR | Status: AC
  Administered 2016-03-18: 0.5 mL via INTRAMUSCULAR
  Filled 2016-03-18: qty 0.5

## 2016-03-18 NOTE — Progress Notes (Signed)
[place] OFFICE PROGRESS NOTE  Patient Care Team: Lona Kettle, MD as PCP - General (Family Medicine)  SUMMARY OF ONCOLOGIC HISTORY:   MDS (myelodysplastic syndrome), low grade ([place])   09/08/2008 Bone Marrow Biopsy    Case #: [id number] BM biopsy showed HYPERCELLULAR MARROW FOR AGE WITH DYSPOIETIC CHANGES.       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 He returns for follow-up. He denies recent infection. He continues to have mild bone pain especially his knees, responding well to low-dose prescription pain medicine. The patient denies any recent signs or symptoms of bleeding such as spontaneous epistaxis, hematuria or hematochezia.  REVIEW OF SYSTEMS:   Constitutional: Denies fevers, chills or abnormal weight loss Eyes: Denies blurriness of vision Ears, nose, mouth, throat, and face: Denies mucositis or sore throat Respiratory: Denies cough, dyspnea or wheezes Cardiovascular: Denies palpitation, chest discomfort or lower extremity swelling Gastrointestinal:  Denies nausea, heartburn or change in bowel habits Skin: Denies abnormal skin rashes Lymphatics: Denies new lymphadenopathy or easy bruising Neurological:Denies numbness, tingling or new weaknesses Behavioral/Psych: Mood is stable, no new changes  All other systems were reviewed with the patient and are negative.  I have reviewed the past medical history, past surgical history, social history and family history with the patient and they are unchanged from previous note.  ALLERGIES:  has No Known Allergies.  MEDICATIONS:  Current Outpatient Prescriptions  Medication Sig Dispense Refill  . allopurinol (ZYLOPRIM) 300 MG tablet Take 300 mg by mouth daily.      Marland Kitchen aspirin 81 MG tablet Take 1 tablet (81 mg total) by mouth daily.    Marland Kitchen atorvastatin  (LIPITOR) 20 MG tablet TAKE 1 TABLET (20 MG TOTAL) BY MOUTH DAILY. 30 tablet 6  . CALCIUM PO Take by mouth.     . carvedilol (COREG) 25 MG tablet TAKE 1 TABLET (25 MG TOTAL) BY MOUTH 2 (TWO) TIMES DAILY. 60 tablet 6  . doxazosin (CARDURA) 4 MG tablet Take 4 mg by mouth at bedtime.      . furosemide (LASIX) 80 MG tablet Take 80 mg by mouth 2 (two) times daily.      Marland Kitchen losartan (COZAAR) 100 MG tablet Take 100 mg by mouth daily.      . meloxicam (MOBIC) 7.5 MG tablet Take 7.5 mg by mouth daily.  6  . Multiple Vitamins-Minerals (MULTIVITAMIN WITH MINERALS) tablet Take 100 tablets by mouth daily.      Marland Kitchen NIFEdipine (ADALAT CC) 90 MG 24 hr tablet Take 90 mg by mouth daily.      Marland Kitchen oxyCODONE (OXY IR/ROXICODONE) 5 MG immediate release tablet Take 1 tablet (5 mg total) by mouth every 6 (six) hours as needed for severe pain. 90 tablet 0  . potassium chloride SA (K-DUR,KLOR-CON) 20 MEQ tablet Take 20 mEq by mouth 2 (two) times daily.       No current facility-administered medications for this visit.     PHYSICAL EXAMINATION: ECOG PERFORMANCE STATUS: 1 - Symptomatic but completely ambulatory  Vitals:   03/18/16 0946  BP: (!) 117/57  Pulse: 64  Resp: 18  Temp: 98.3 F (36.8 C)   Filed Weights   03/18/16 0946  Weight: 198 lb 11.2 oz (90.1 kg)    GENERAL:alert, no distress and comfortable SKIN: skin color, texture, turgor are normal, no rashes or significant  lesions EYES: normal, Conjunctiva are pink and non-injected, sclera clear OROPHARYNX:no exudate, no erythema and lips, buccal mucosa, and tongue normal  NECK: supple, thyroid normal size, non-tender, without nodularity LYMPH:  no palpable lymphadenopathy in the cervical, axillary or inguinal LUNGS: clear to auscultation and percussion with normal breathing effort HEART: regular rate & rhythm and no murmurs and no lower extremity edema ABDOMEN:abdomen soft, non-tender and normal bowel sounds Musculoskeletal:no cyanosis of digits and no  clubbing  NEURO: alert & oriented x 3 with fluent speech, no focal motor/sensory deficits  LABORATORY DATA:  I have reviewed the data as listed    Component Value Date/Time   NA 143 05/16/2015 0956   K 4.7 05/16/2015 0956   CL 105 10/13/2013 0901   CL 109 (H) 04/13/2012 0852   CO2 24 05/16/2015 0956   GLUCOSE 109 05/16/2015 0956   GLUCOSE 120 (H) 04/13/2012 0852   BUN 27.1 (H) 05/16/2015 0956   CREATININE 1.4 (H) 05/16/2015 0956   CALCIUM 9.5 05/16/2015 0956   PROT 7.2 05/16/2015 0956   ALBUMIN 3.8 05/16/2015 0956   AST 21 05/16/2015 0956   ALT 13 05/16/2015 0956   ALKPHOS 57 05/16/2015 0956   BILITOT 0.55 05/16/2015 0956   GFRNONAA 56 (L) 11/30/2008 0510   GFRAA  11/30/2008 0510    >60        The eGFR has been calculated using the MDRD equation. This calculation has not been validated in all clinical situations. eGFR's persistently <60 mL/min signify possible Chronic Kidney Disease.    No results found for: SPEP, UPEP  Lab Results  Component Value Date   WBC 3.2 (L) 03/18/2016   NEUTROABS 1.7 03/18/2016   HGB 10.7 (L) 03/18/2016   HCT 32.5 (L) 03/18/2016   MCV 86.1 03/18/2016   PLT 80 (L) 03/18/2016      Chemistry      Component Value Date/Time   NA 143 05/16/2015 0956   K 4.7 05/16/2015 0956   CL 105 10/13/2013 0901   CL 109 (H) 04/13/2012 0852   CO2 24 05/16/2015 0956   BUN 27.1 (H) 05/16/2015 0956   CREATININE 1.4 (H) 05/16/2015 0956      Component Value Date/Time   CALCIUM 9.5 05/16/2015 0956   ALKPHOS 57 05/16/2015 0956   AST 21 05/16/2015 0956   ALT 13 05/16/2015 0956   BILITOT 0.55 05/16/2015 0956     ASSESSMENT & PLAN:  MDS (myelodysplastic syndrome), low grade The patient has stable disease We had a long discussion in the past with the patient regarding treatment options including supportive therapy only versus chemotherapy. The patient is not keen to undergo chemotherapy. I will see him back in 6 months for repeat history, physical  examination and blood work.  Pancytopenia ([place]) This is likely related to underlying disease. The patient denies recent history of bleeding such as epistaxis, hematuria or hematochezia. He had no recent infection. He is asymptomatic from the pancytopenia. We will observe for now.    Left knee pain This could be related to chronic arthritis. I gave him prescription of oxycodone. We discussed about the role of pain management in the oncology clinic. The patient agreed to be compliant with prescribed pain regimen and promised not to share the prescribed medications. Any lost medications or missed prescription will not be refilled sooner than anticipated time when the patient's prescription is expected to run out. We also discussed narcotics refill policy in the clinic.  The patient is educated to check the  pill bottles in the middle of the week and ensure there is adequate supply to last through the weekend until next appointment or available business day.  The oncology service has a strict policy not to refill pain medications after business hours or the weekend.    Orders Placed This Encounter  Procedures  . CBC with Differential/Platelet    Standing Status:   Future    Standing Expiration Date:   04/22/2017   All questions were answered. The patient knows to call the clinic with any problems, questions or concerns. No barriers to learning was detected. I spent 15 minutes counseling the patient face to face. The total time spent in the appointment was 20 minutes and more than 50% was on counseling and review of test results     [facility], [place], MD 03/18/2016 10:28 AM

## 2016-03-18 NOTE — Telephone Encounter (Signed)
Gave patient avs report and appointments for March 2018.  °

## 2016-03-18 NOTE — Assessment & Plan Note (Signed)
This could be related to chronic arthritis. I gave him prescription of oxycodone. We discussed about the role of pain management in the oncology clinic. The patient agreed to be compliant with prescribed pain regimen and promised not to share the prescribed medications. Any lost medications or missed prescription will not be refilled sooner than anticipated time when the patient's prescription is expected to run out. We also discussed narcotics refill policy in the clinic.  The patient is educated to check the pill bottles in the middle of the week and ensure there is adequate supply to last through the weekend until next appointment or available business day.  The oncology service has a strict policy not to refill pain medications after business hours or the weekend.   

## 2016-03-18 NOTE — Assessment & Plan Note (Signed)
This is likely related to underlying disease. The patient denies recent history of bleeding such as epistaxis, hematuria or hematochezia. He had no recent infection. He is asymptomatic from the pancytopenia. We will observe for now.

## 2016-03-18 NOTE — Assessment & Plan Note (Signed)
The patient has stable disease We had a long discussion in the past with the patient regarding treatment options including supportive therapy only versus chemotherapy. The patient is not keen to undergo chemotherapy. I will see him back in 6 months for repeat history, physical examination and blood work.

## 2016-08-11 ENCOUNTER — Encounter (HOSPITAL_COMMUNITY): Payer: Self-pay | Admitting: Emergency Medicine

## 2016-08-11 ENCOUNTER — Inpatient Hospital Stay (HOSPITAL_COMMUNITY)
Admission: EM | Admit: 2016-08-11 | Discharge: 2016-08-16 | DRG: 380 | Disposition: A | Discharge status: Home Health | Payer: Medicare Other | Attending: Internal Medicine | Admitting: Internal Medicine

## 2016-08-11 ENCOUNTER — Emergency Department (HOSPITAL_COMMUNITY): Payer: Medicare Other

## 2016-08-11 DIAGNOSIS — E872 Acidosis: Secondary | ICD-10-CM | POA: Diagnosis present

## 2016-08-11 DIAGNOSIS — M109 Gout, unspecified: Secondary | ICD-10-CM | POA: Diagnosis present

## 2016-08-11 DIAGNOSIS — K250 Acute gastric ulcer with hemorrhage: Secondary | ICD-10-CM | POA: Diagnosis not present

## 2016-08-11 DIAGNOSIS — R6889 Other general symptoms and signs: Secondary | ICD-10-CM

## 2016-08-11 DIAGNOSIS — K2211 Ulcer of esophagus with bleeding: Principal | ICD-10-CM

## 2016-08-11 DIAGNOSIS — K269 Duodenal ulcer, unspecified as acute or chronic, without hemorrhage or perforation: Secondary | ICD-10-CM | POA: Diagnosis present

## 2016-08-11 DIAGNOSIS — Z96651 Presence of right artificial knee joint: Secondary | ICD-10-CM | POA: Diagnosis present

## 2016-08-11 DIAGNOSIS — Z6824 Body mass index (BMI) 24.0-24.9, adult: Secondary | ICD-10-CM

## 2016-08-11 DIAGNOSIS — Z87891 Personal history of nicotine dependence: Secondary | ICD-10-CM

## 2016-08-11 DIAGNOSIS — I2510 Atherosclerotic heart disease of native coronary artery without angina pectoris: Secondary | ICD-10-CM | POA: Diagnosis present

## 2016-08-11 DIAGNOSIS — J189 Pneumonia, unspecified organism: Secondary | ICD-10-CM | POA: Diagnosis present

## 2016-08-11 DIAGNOSIS — N183 Chronic kidney disease, stage 3 (moderate): Secondary | ICD-10-CM

## 2016-08-11 DIAGNOSIS — D62 Acute posthemorrhagic anemia: Secondary | ICD-10-CM | POA: Diagnosis present

## 2016-08-11 DIAGNOSIS — Z951 Presence of aortocoronary bypass graft: Secondary | ICD-10-CM

## 2016-08-11 DIAGNOSIS — K921 Melena: Secondary | ICD-10-CM

## 2016-08-11 DIAGNOSIS — E878 Other disorders of electrolyte and fluid balance, not elsewhere classified: Secondary | ICD-10-CM | POA: Diagnosis present

## 2016-08-11 DIAGNOSIS — E43 Unspecified severe protein-calorie malnutrition: Secondary | ICD-10-CM | POA: Diagnosis present

## 2016-08-11 DIAGNOSIS — D4620 Refractory anemia with excess of blasts, unspecified: Secondary | ICD-10-CM | POA: Diagnosis present

## 2016-08-11 DIAGNOSIS — D649 Anemia, unspecified: Secondary | ICD-10-CM

## 2016-08-11 DIAGNOSIS — E1122 Type 2 diabetes mellitus with diabetic chronic kidney disease: Secondary | ICD-10-CM | POA: Diagnosis present

## 2016-08-11 DIAGNOSIS — D61818 Other pancytopenia: Secondary | ICD-10-CM | POA: Diagnosis present

## 2016-08-11 DIAGNOSIS — D696 Thrombocytopenia, unspecified: Secondary | ICD-10-CM | POA: Diagnosis present

## 2016-08-11 DIAGNOSIS — D469 Myelodysplastic syndrome, unspecified: Secondary | ICD-10-CM | POA: Diagnosis present

## 2016-08-11 DIAGNOSIS — I5032 Chronic diastolic (congestive) heart failure: Secondary | ICD-10-CM

## 2016-08-11 DIAGNOSIS — Z800 Family history of malignant neoplasm of digestive organs: Secondary | ICD-10-CM

## 2016-08-11 DIAGNOSIS — K254 Chronic or unspecified gastric ulcer with hemorrhage: Secondary | ICD-10-CM

## 2016-08-11 DIAGNOSIS — I350 Nonrheumatic aortic (valve) stenosis: Secondary | ICD-10-CM | POA: Diagnosis present

## 2016-08-11 DIAGNOSIS — E876 Hypokalemia: Secondary | ICD-10-CM | POA: Diagnosis present

## 2016-08-11 DIAGNOSIS — E785 Hyperlipidemia, unspecified: Secondary | ICD-10-CM | POA: Diagnosis present

## 2016-08-11 DIAGNOSIS — D638 Anemia in other chronic diseases classified elsewhere: Secondary | ICD-10-CM | POA: Diagnosis present

## 2016-08-11 DIAGNOSIS — D46Z Other myelodysplastic syndromes: Secondary | ICD-10-CM | POA: Diagnosis present

## 2016-08-11 DIAGNOSIS — N179 Acute kidney failure, unspecified: Secondary | ICD-10-CM | POA: Diagnosis present

## 2016-08-11 DIAGNOSIS — I130 Hypertensive heart and chronic kidney disease with heart failure and stage 1 through stage 4 chronic kidney disease, or unspecified chronic kidney disease: Secondary | ICD-10-CM | POA: Diagnosis present

## 2016-08-11 DIAGNOSIS — E870 Hyperosmolality and hypernatremia: Secondary | ICD-10-CM | POA: Diagnosis present

## 2016-08-11 DIAGNOSIS — Z79899 Other long term (current) drug therapy: Secondary | ICD-10-CM

## 2016-08-11 DIAGNOSIS — R0602 Shortness of breath: Secondary | ICD-10-CM

## 2016-08-11 DIAGNOSIS — I10 Essential (primary) hypertension: Secondary | ICD-10-CM | POA: Diagnosis not present

## 2016-08-11 DIAGNOSIS — Z7982 Long term (current) use of aspirin: Secondary | ICD-10-CM

## 2016-08-11 DIAGNOSIS — K922 Gastrointestinal hemorrhage, unspecified: Secondary | ICD-10-CM | POA: Diagnosis present

## 2016-08-11 DIAGNOSIS — R509 Fever, unspecified: Secondary | ICD-10-CM

## 2016-08-11 DIAGNOSIS — N400 Enlarged prostate without lower urinary tract symptoms: Secondary | ICD-10-CM | POA: Diagnosis present

## 2016-08-11 LAB — COMPREHENSIVE METABOLIC PANEL
ALT: 15 U/L — AB (ref 17–63)
AST: 33 U/L (ref 15–41)
Albumin: 2.7 g/dL — ABNORMAL LOW (ref 3.5–5.0)
Alkaline Phosphatase: 32 U/L — ABNORMAL LOW (ref 38–126)
Anion gap: 10 (ref 5–15)
BUN: 73 mg/dL — AB (ref 6–20)
CHLORIDE: 119 mmol/L — AB (ref 101–111)
CO2: 16 mmol/L — AB (ref 22–32)
CREATININE: 2.15 mg/dL — AB (ref 0.61–1.24)
Calcium: 8.5 mg/dL — ABNORMAL LOW (ref 8.9–10.3)
GFR calc Af Amer: 32 mL/min — ABNORMAL LOW (ref >60)
GFR calc non Af Amer: 27 mL/min — ABNORMAL LOW (ref >60)
Glucose, Bld: 160 mg/dL — ABNORMAL HIGH (ref 65–99)
Potassium: 3.9 mmol/L (ref 3.5–5.1)
SODIUM: 145 mmol/L (ref 135–145)
Total Bilirubin: 0.9 mg/dL (ref 0.3–1.2)
Total Protein: 6.2 g/dL — ABNORMAL LOW (ref 6.5–8.1)

## 2016-08-11 LAB — RETICULOCYTES
RBC.: 1.65 MIL/uL — ABNORMAL LOW (ref 4.22–5.81)
Retic Count, Absolute: 52.8 10*3/uL (ref 19.0–186.0)
Retic Ct Pct: 3.2 % — ABNORMAL HIGH (ref 0.4–3.1)

## 2016-08-11 LAB — I-STAT TROPONIN, ED: Troponin i, poc: 0.01 ng/mL (ref 0.00–0.08)

## 2016-08-11 LAB — D-DIMER, QUANTITATIVE: D-Dimer, Quant: 3.27 ug/mL-FEU — ABNORMAL HIGH (ref 0.00–0.50)

## 2016-08-11 LAB — RESPIRATORY PANEL BY PCR

## 2016-08-11 LAB — CBC WITH DIFFERENTIAL/PLATELET
BASOS PCT: 0 %
Basophils Absolute: 0 10*3/uL (ref 0.0–0.1)
EOS PCT: 11 %
Eosinophils Absolute: 0.4 10*3/uL (ref 0.0–0.7)
HCT: 13 % — ABNORMAL LOW (ref 39.0–52.0)
HEMOGLOBIN: 4.1 g/dL — AB (ref 13.0–17.0)
LYMPHS PCT: 6 %
Lymphs Abs: 0.2 10*3/uL — ABNORMAL LOW (ref 0.7–4.0)
MCH: 24.8 pg — AB (ref 26.0–34.0)
MCHC: 31.5 g/dL (ref 30.0–36.0)
MCV: 78.8 fL (ref 78.0–100.0)
MONOS PCT: 3 %
Monocytes Absolute: 0.1 10*3/uL (ref 0.1–1.0)
NEUTROS PCT: 80 %
Neutro Abs: 3.1 10*3/uL (ref 1.7–7.7)
Platelets: 68 10*3/uL — ABNORMAL LOW (ref 150–400)
RBC: 1.65 MIL/uL — ABNORMAL LOW (ref 4.22–5.81)
RDW: 22.8 % — ABNORMAL HIGH (ref 11.5–15.5)
WBC: 3.8 10*3/uL — ABNORMAL LOW (ref 4.0–10.5)

## 2016-08-11 LAB — MRSA PCR SCREENING: MRSA by PCR: POSITIVE — AB

## 2016-08-11 LAB — BRAIN NATRIURETIC PEPTIDE: B NATRIURETIC PEPTIDE 5: 415 pg/mL — AB (ref 0.0–100.0)

## 2016-08-11 LAB — I-STAT CG4 LACTIC ACID, ED: LACTIC ACID, VENOUS: 2.14 mmol/L — AB (ref 0.5–1.9)

## 2016-08-11 LAB — IRON AND TIBC
IRON: 16 ug/dL — AB (ref 45–182)
Saturation Ratios: 7 % — ABNORMAL LOW (ref 17.9–39.5)
TIBC: 224 ug/dL — AB (ref 250–450)
UIBC: 208 ug/dL

## 2016-08-11 LAB — PREPARE RBC (CROSSMATCH)

## 2016-08-11 LAB — POC OCCULT BLOOD, ED: Fecal Occult Bld: POSITIVE — AB

## 2016-08-11 LAB — PROTIME-INR
INR: 1.34
PROTHROMBIN TIME: 16.7 s — AB (ref 11.4–15.2)

## 2016-08-11 LAB — VITAMIN B12: VITAMIN B 12: 2828 pg/mL — AB (ref 180–914)

## 2016-08-11 LAB — FERRITIN: Ferritin: 57 ng/mL (ref 24–336)

## 2016-08-11 LAB — LACTIC ACID, PLASMA: Lactic Acid, Venous: 1.6 mmol/L (ref 0.5–1.9)

## 2016-08-11 LAB — FOLATE: Folate: 40 ng/mL (ref >5.9)

## 2016-08-11 MED ORDER — FUROSEMIDE 40 MG PO TABS
80.0000 mg | ORAL_TABLET | Freq: Every day | ORAL | Status: DC
  Filled 2016-08-11: qty 2

## 2016-08-11 MED ORDER — MUPIROCIN 2 % EX OINT
1.0000 "application " | TOPICAL_OINTMENT | Freq: Two times a day (BID) | CUTANEOUS | Status: DC
  Administered 2016-08-11: 1 via NASAL
  Filled 2016-08-11: qty 22

## 2016-08-11 MED ORDER — ALLOPURINOL 100 MG PO TABS
300.0000 mg | ORAL_TABLET | Freq: Every day | ORAL | Status: DC
  Administered 2016-08-11: 300 mg via ORAL
  Filled 2016-08-11 (×2): qty 3

## 2016-08-11 MED ORDER — SODIUM CHLORIDE 0.9 % IV BOLUS (SEPSIS)
500.0000 mL | Freq: Once | INTRAVENOUS | Status: AC
  Administered 2016-08-11: 500 mL via INTRAVENOUS

## 2016-08-11 MED ORDER — DOXAZOSIN MESYLATE 4 MG PO TABS
4.0000 mg | ORAL_TABLET | Freq: Every day | ORAL | Status: DC
  Administered 2016-08-11: 4 mg via ORAL
  Filled 2016-08-11: qty 1
  Filled 2016-08-11: qty 4

## 2016-08-11 MED ORDER — SODIUM CHLORIDE 0.9 % IV SOLN
Freq: Once | INTRAVENOUS | Status: AC
  Administered 2016-08-11: 21:00:00 via INTRAVENOUS

## 2016-08-11 MED ORDER — POTASSIUM CHLORIDE CRYS ER 20 MEQ PO TBCR
20.0000 meq | EXTENDED_RELEASE_TABLET | Freq: Two times a day (BID) | ORAL | Status: DC
  Administered 2016-08-11 (×2): 20 meq via ORAL
  Filled 2016-08-11 (×3): qty 1

## 2016-08-11 MED ORDER — PANTOPRAZOLE SODIUM 40 MG IV SOLR
40.0000 mg | Freq: Two times a day (BID) | INTRAVENOUS | Status: DC
  Administered 2016-08-11 (×2): 40 mg via INTRAVENOUS
  Filled 2016-08-11 (×2): qty 40

## 2016-08-11 MED ORDER — SODIUM CHLORIDE 0.9 % IV SOLN
Freq: Once | INTRAVENOUS | Status: AC
Start: 2016-08-11 — End: 2016-08-11
  Administered 2016-08-11: 11:00:00 via INTRAVENOUS

## 2016-08-11 MED ORDER — ATORVASTATIN CALCIUM 10 MG PO TABS
20.0000 mg | ORAL_TABLET | Freq: Every day | ORAL | Status: DC
  Administered 2016-08-11: 20 mg via ORAL
  Filled 2016-08-11: qty 2

## 2016-08-11 MED ORDER — CHLORHEXIDINE GLUCONATE CLOTH 2 % EX PADS
6.0000 | MEDICATED_PAD | Freq: Every day | CUTANEOUS | Status: DC
  Administered 2016-08-12: 6 via TOPICAL

## 2016-08-11 MED ORDER — CARVEDILOL 25 MG PO TABS: 25.0000 mg | ORAL_TABLET | Freq: Two times a day (BID) | ORAL | Status: DC

## 2016-08-11 MED ORDER — NIFEDIPINE ER OSMOTIC RELEASE 90 MG PO TB24: 90.0000 mg | ORAL_TABLET | Freq: Every day | ORAL | Status: DC

## 2016-08-11 MED ORDER — FUROSEMIDE 10 MG/ML IJ SOLN
20.0000 mg | INTRAMUSCULAR | Status: AC
  Administered 2016-08-11 (×2): 20 mg via INTRAVENOUS
  Filled 2016-08-11 (×2): qty 2

## 2016-08-11 MED ORDER — ORAL CARE MOUTH RINSE: 15.0000 mL | Freq: Two times a day (BID) | OROMUCOSAL | Status: DC

## 2016-08-11 MED ORDER — IPRATROPIUM-ALBUTEROL 0.5-2.5 (3) MG/3ML IN SOLN: 3.0000 mL | Freq: Four times a day (QID) | RESPIRATORY_TRACT | Status: DC | PRN

## 2016-08-11 NOTE — ED Triage Notes (Addendum)
Patient was dx with pneumonia last Friday. Patient has been taking antibiotics at home. Patient stating his symptoms are persisting and complaining of dyspnea with exertion, chills, and sore throat.

## 2016-08-11 NOTE — Progress Notes (Signed)
CRITICAL VALUE ALERT  Critical value received:  hgb 5.9  Date of notification:  2/11  Time of notification:  1955  Critical value read back: yes  Nurse who received alert:  Everlean Alstrom, RN  MD notified (1st page):  K. Schorr  Time of first page:  1958

## 2016-08-11 NOTE — ED Provider Notes (Signed)
[facility] DEPT Provider Note   CSN: [id number] Arrival date & time: 08/11/16  [id number]     History   Chief Complaint Chief Complaint  Patient presents with  . Pneumonia    HPI [name] is a 83 y.o. male.  HPI   83 year old male with history of diabetes, CAD, MDS, anemia of chronic disease presenting with complaints of Flulike symptoms. For the past 3 weeks patient report having subjective fever, chills, body aches, generalized fatigue, sore throat, congestion, cough occasionally productive with white sputum and now increased shortness of breath and pleuritic chest pain. Even walking short distance causing him to be extra tired. He was seen by his PCP over a week ago and had a chest x-ray that shows pneumonia. He was subsequently discharged with Levaquin for 10 days. He is on his ninth day of taking the antibiotic but noticed no improvement. He did have his flu shot. He denies fluid gain, hemoptysis, dysuria, vomiting or diarrhea. Patient is a former smoker.     Past Medical History:  Diagnosis Date  . Anemia, chronic disease    Followed by Dr. Lamonte Sakai  . Back pain 01/25/2014  . Coronary artery disease    CABG x4  -- with Est. EF of 50-60%  --  07/06/2007  . Diabetes mellitus   . Gout   . Hyperlipidemia   . Hypertension    since his Mid -- 20's  . MDS (myelodysplastic syndrome), low grade ([place])   . Mild aortic stenosis   . Osteoarthritis   . Renal insufficiency   . Thrombocytopenia [facility])     Patient Active Problem List   Diagnosis Date Noted  . Left knee pain 12/27/2014  . Midline low back pain without sciatica 03/29/2014  . Back pain 01/25/2014  . Leukopenia 01/25/2014  . Hyperlipidemia 08/09/2011  . Osteoarthritis   . MDS (myelodysplastic syndrome), low grade ([place])   . Pancytopenia ([place])   . Anemia in neoplastic disease   . CAD (coronary artery disease) 01/08/2011  . Mild aortic stenosis 01/08/2011  . HTN (hypertension) 01/08/2011    Past Surgical History:   Procedure Laterality Date  . CARDIAC CATHETERIZATION  06/29/2007    Left heart catheterization with selective coronary angiography, left ventricular angiography -- Probable normal left ventricular function and dilated aortic root -- Extensive and heavy calcification of coronary arteries --  Ludwig Lean. Doreatha Lew, M.D.       . CORONARY ARTERY BYPASS GRAFT  07/06/2007    x4 using a left internal mammary  artery graft to the left anterior descending coronary artery with a saphenous vein graft to the diagonal branch to the left anterior descending artery, a saphenous vein graft to the obtuse marginal branch of the left circumflex coronary artery, and a saphenous vein graft to  the posterior descending artery of the right coronary artery -  Gilford Raid, M.D  . TOTAL KNEE ARTHROPLASTY  11/25/2008   Right knee - Severe degenerative arthritis with a varus deformity of the right knee --  Kipp Brood. Gladstone Lighter, M.D.        Home Medications    Prior to Admission medications   Medication Sig Start Date End Date Taking? Authorizing Provider  allopurinol (ZYLOPRIM) 300 MG tablet Take 300 mg by mouth daily.      Historical Provider, MD  aspirin 81 MG tablet Take 1 tablet (81 mg total) by mouth daily. 10/13/13   Thayer Headings, MD  atorvastatin (LIPITOR) 20 MG tablet TAKE 1 TABLET (20  MG TOTAL) BY MOUTH DAILY. 04/06/12   Thayer Headings, MD  CALCIUM PO Take by mouth.     Historical Provider, MD  carvedilol (COREG) 25 MG tablet TAKE 1 TABLET (25 MG TOTAL) BY MOUTH 2 (TWO) TIMES DAILY. 12/26/14   Thayer Headings, MD  doxazosin (CARDURA) 4 MG tablet Take 4 mg by mouth at bedtime.      Historical Provider, MD  furosemide (LASIX) 80 MG tablet Take 80 mg by mouth 2 (two) times daily.      Historical Provider, MD  losartan (COZAAR) 100 MG tablet Take 100 mg by mouth daily.      Historical Provider, MD  meloxicam (MOBIC) 7.5 MG tablet Take 7.5 mg by mouth daily. 05/08/15   Historical Provider, MD  Multiple  Vitamins-Minerals (MULTIVITAMIN WITH MINERALS) tablet Take 100 tablets by mouth daily.      Historical Provider, MD  NIFEdipine (ADALAT CC) 90 MG 24 hr tablet Take 90 mg by mouth daily.      Historical Provider, MD  oxyCODONE (OXY IR/ROXICODONE) 5 MG immediate release tablet Take 1 tablet (5 mg total) by mouth every 6 (six) hours as needed for severe pain. 03/18/16   Heath Lark, MD  potassium chloride SA (K-DUR,KLOR-CON) 20 MEQ tablet Take 20 mEq by mouth 2 (two) times daily.      Historical Provider, MD    Family History Family History  Problem Relation Age of Onset  . Cancer Daughter     colon ca    Social History Social History  Substance Use Topics  . Smoking status: Former Smoker    Quit date: 07/01/1980  . Smokeless tobacco: Never Used  . Alcohol use No     Allergies   Patient has no known allergies.   Review of Systems Review of Systems  All other systems reviewed and are negative.    Physical Exam Updated Vital Signs BP (!) 117/52 (BP Location: Left Arm)   Pulse 98   Temp 98.6 F (37 C) (Oral)   Resp 20   Ht 6' (1.829 m)   Wt 88.5 kg   SpO2 96%   BMI 26.45 kg/m   Physical Exam  Constitutional: He appears well-developed and well-nourished. No distress.  HENT:  Head: Atraumatic.  Right Ear: External ear normal.  Left Ear: External ear normal.  Mouth is dry. Full dentures in place. Red patch noted to the soft palate. Uvula is midline.  Eyes: Conjunctivae are normal.  Neck: Neck supple.  Cardiovascular: Normal rate and regular rhythm.   Pulmonary/Chest: Effort normal and breath sounds normal. He has no rales.  Abdominal: Soft. Bowel sounds are normal. He exhibits no distension. There is no tenderness.  Genitourinary:  Genitourinary Comments: Chaperone present during exam.  Normal rectal tone, no obvious mass, brown color stool on glove.  Hemoccult positive.   Neurological: He is alert.  Skin: No rash noted.  Psychiatric: He has a normal mood and affect.    Nursing note and vitals reviewed.    ED Treatments / Results  Labs (all labs ordered are listed, but only abnormal results are displayed) Labs Reviewed  CBC WITH DIFFERENTIAL/PLATELET - Abnormal; Notable for the following:       Result Value   WBC 3.8 (*)    RBC 1.65 (*)    Hemoglobin 4.1 (*)    HCT 13.0 (*)    MCH 24.8 (*)    RDW 22.8 (*)    Platelets 68 (*)    Lymphs Abs  0.2 (*)    All other components within normal limits  BRAIN NATRIURETIC PEPTIDE - Abnormal; Notable for the following:    B Natriuretic Peptide 415.0 (*)    All other components within normal limits  D-DIMER, QUANTITATIVE (NOT AT [facility]) - Abnormal; Notable for the following:    D-Dimer, Quant 3.27 (*)    All other components within normal limits  RETICULOCYTES - Abnormal; Notable for the following:    Retic Ct Pct 3.2 (*)    RBC. 1.65 (*)    All other components within normal limits  COMPREHENSIVE METABOLIC PANEL - Abnormal; Notable for the following:    Chloride 119 (*)    CO2 16 (*)    Glucose, Bld 160 (*)    BUN 73 (*)    Creatinine, Ser 2.15 (*)    Calcium 8.5 (*)    Total Protein 6.2 (*)    Albumin 2.7 (*)    ALT 15 (*)    Alkaline Phosphatase 32 (*)    GFR calc non Af Amer 27 (*)    GFR calc Af Amer 32 (*)    All other components within normal limits  I-STAT CG4 LACTIC ACID, ED - Abnormal; Notable for the following:    Lactic Acid, Venous 2.14 (*)    All other components within normal limits  POC OCCULT BLOOD, ED - Abnormal; Notable for the following:    Fecal Occult Bld POSITIVE (*)    All other components within normal limits  RESPIRATORY PANEL BY PCR  VITAMIN B12  FOLATE  IRON AND TIBC  FERRITIN  I-STAT CHEM 8, ED  I-STAT TROPOININ, ED  CBG MONITORING, ED  TYPE AND SCREEN  PREPARE RBC (CROSSMATCH)    EKG  EKG Interpretation  Date/Time:  Sunday August 11 2016 10:00:35 EST Ventricular Rate:  93 PR Interval:    QRS Duration: 96 QT Interval:  334 QTC Calculation: 416 R  Axis:   -2 Text Interpretation:  Sinus rhythm Low voltage, extremity and precordial leads Repol abnrm suggests ischemia, anterolateral since last tracing no significant change Confirmed by Eulis Foster  MD, ELLIOTT [id number]) on 08/11/2016 10:04:52 AM       Radiology Dg Chest 2 View  Result Date: 08/11/2016 CLINICAL DATA:  Pain all over.  Recent pneumonia. EXAM: CHEST  2 VIEW COMPARISON:  08/02/2016 FINDINGS: cardiomegaly. Prior CABG. No confluent airspace opacities or effusions. No acute bony abnormality. IMPRESSION: Cardiomegaly.  No acute findings. Electronically Signed   By: Rolm Baptise M.D.   On: 08/11/2016 09:28    Procedures Procedures (including critical care time)  Medications Ordered in ED Medications  sodium chloride 0.9 % bolus 500 mL (0 mLs Intravenous Stopped 08/11/16 1104)  0.9 %  sodium chloride infusion ( Intravenous New Bag/Given 08/11/16 1111)     Initial Impression / Assessment and Plan / ED Course  I have reviewed the triage vital signs and the nursing notes.  Pertinent labs & imaging results that were available during my care of the patient were reviewed by me and considered in my medical decision making (see chart for details).  Clinical Course as of Aug 11 1150  Sun Aug 11, 2016  0939 NAD DG Chest 2 View [EW]    Clinical Course User Index [EW] Daleen Bo, MD    BP (!) 99/44 (BP Location: Left Arm)   Pulse 91   Temp 98.6 F (37 C) (Oral)   Resp 18   Ht 6' (1.829 m)   Wt 88.5 kg  SpO2 96%   BMI 26.45 kg/m    Final Clinical Impressions(s) / ED Diagnoses   Final diagnoses:  Symptomatic anemia  Flu-like symptoms  Gastrointestinal hemorrhage, unspecified gastrointestinal hemorrhage type    New Prescriptions New Prescriptions   No medications on file   10:03 AM Patient here with flulike symptoms which has been ongoing for about 3 weeks. Was treated for pneumonia with Levaquin but no improvement. He is here with generalized weakness and fatigue but  his primary complaint is shortness of breath. He is afebrile vital signs stable. Chest x-ray showing no focal infiltrate concerning for pneumonia. He did report having dark-colored stools for the past week and half. Did use NSAIDs only on occasion. On rectal exam patient has brown color stool does show trace Hemoccult-positive on fecal occult blood test. Abdomen is nontender. Workup initiated. Care discussed with Dr. Eulis Foster.   11:07 AM Blood work is remarkable for severe anemia with hemoglobin of 4.1. His last hemoglobin 4 months ago was 10.7. BUN 77, creatinine 2.2, hematocrit is less than 15. Lab is supportive or suggestive of GI bleeding. An anemia panel was obtained. Patient will receive blood transfusion. Mildly elevated lactic acid of 2.14. D-dimer is elevated at 3.27 but this is nonspecific. Suspect shortness of breath is likely due to symptomatically anemia less likely to be a PE. Plan to consult hospitalist for admission.  11:21 AM Appreciate consultation from [facility] Dr. Elon Jester who agrees to admit pt to step down.  He request GI to be consulted as well.    11:51 AM I have consulted [facility], Dr. Ardis Hughs who agrees to see pt and will be available for consultation.   CRITICAL CARE Performed by: Domenic Moras Total critical care time: 30 minutes Critical care time was exclusive of separately billable procedures and treating other patients. Critical care was necessary to treat or prevent imminent or life-threatening deterioration. Critical care was time spent personally by me on the following activities: development of treatment plan with patient and/or surrogate as well as nursing, discussions with consultants, evaluation of patient's response to treatment, examination of patient, obtaining history from patient or surrogate, ordering and performing treatments and interventions, ordering and review of laboratory studies, ordering and review of radiographic studies, pulse oximetry and  re-evaluation of patient's condition.    Domenic Moras, PA-C 08/11/16 [place], MD 08/11/16 [phone]

## 2016-08-11 NOTE — ED Notes (Signed)
RN at bedside starting IV will collect labs 

## 2016-08-11 NOTE — ED Notes (Signed)
No sign or symptoms of any blood reaction at this time. Stay with patient for first 15 min of transfusion VS with normal limit. Will continue to monitor patient.

## 2016-08-11 NOTE — ED Provider Notes (Signed)
  Face-to-face evaluation   History: He complains of fatigue, and persistent cough, despite being treated with an antibiotic for pneumonia, recently, with 1 pill left. He has had on and off fever and chills. He gets out of breath when he walks. His stool has been "black". Is able to eat but has decreased appetite.  Physical exam: Alert, elderly man. He is resting comfortably in the supine position, and not in any respiratory distress. Lungs with good air movement bilaterally without wheezes, Rales or rhonchi. Regular rate and rhythm without murmur. Abdomen is soft and nontender to palpation   Clinical Course as of Aug 11 945  Nancy Fetter Aug 11, 2016  0939 NAD DG Chest 2 View [EW]    Clinical Course User Index [EW] Daleen Bo, MD    Medical screening examination/treatment/procedure(s) were conducted as a shared visit with non-physician practitioner(s) and myself.  I personally evaluated the patient during the encounter   Daleen Bo, MD 08/11/16 1511

## 2016-08-11 NOTE — H&P (Signed)
History and Physical    [name] [id number] DOB: [birth year]  DOA: 08/11/2016 PCP: Melinda Crutch, MD  Patient coming from: Home    Chief Complaint: Generalize weakness   HPI: [name] is a 83 y.o. male with interval medical history as listed below. Pertinent PMHx myelodysplastic syndrome and anemia of chronic disease. Patient presented to the emergency room complaining of generalized weakness and shortness of breath. Patient report "flu like symptoms" for the past 2 - 3 weeks. One week ago when to his PCP who diagnosed pneumonia give him antibiotics and a steroid shot. Patient took 8 days of antibiotic treatment with no significant improvement. Two days prior to admission patient reports increase in shortness of breath and feeling more weak. Associated symptoms are chest tightness, subjective fever, and malaise. Patient report that he has been having black sticky stools persistently for the past 3 weeks with no gross bleeding. Denies nose bleed or bloody vomiting. Denies NSAID use or previous episodes of the same, no nausea or abdominal pain.  ED Course: Hemoglobin 4.1 which dropped from 10.7 on 03/18/2016, Hemoccult positive, creatinine 2.15, BUN 73, lactic acid 2.1, BNP 415.   Review of Systems:   General:  positive for generalized weakness, fevers, chills and decrease in energy  HEENT: no blurry vision or hearing changes  Respiratory:  see history of present illness  CV: no palpitations GI:  see history of present illness  GU: no dysuria, burning on urination, increased urinary frequency, hematuria  Ext:. No deformities,  Neuro: no unilateral weakness, numbness, or tingling Skin: No rashes, lesions or wounds. MSK: No muscle spasm, no deformity, no limitation of range of movement in spin Heme: No easy bruising.   Past Medical History:  Diagnosis Date  . Anemia, chronic disease    Followed by Dr. Lamonte Sakai  . Back pain 01/25/2014  . Coronary artery disease    CABG x4  -- with  Est. EF of 50-60%  --  07/06/2007  . Diabetes mellitus   . Gout   . Hyperlipidemia   . Hypertension    since his Mid -- 20's  . MDS (myelodysplastic syndrome), low grade ([place])   . Mild aortic stenosis   . Osteoarthritis   . Renal insufficiency   . Thrombocytopenia ([place])     Past Surgical History:  Procedure Laterality Date  . CARDIAC CATHETERIZATION  06/29/2007    Left heart catheterization with selective coronary angiography, left ventricular angiography -- Probable normal left ventricular function and dilated aortic root -- Extensive and heavy calcification of coronary arteries --  Ludwig Lean. Doreatha Lew, M.D.       . CORONARY ARTERY BYPASS GRAFT  07/06/2007    x4 using a left internal mammary  artery graft to the left anterior descending coronary artery with a saphenous vein graft to the diagonal branch to the left anterior descending artery, a saphenous vein graft to the obtuse marginal branch of the left circumflex coronary artery, and a saphenous vein graft to  the posterior descending artery of the right coronary artery -  Gilford Raid, M.D  . TOTAL KNEE ARTHROPLASTY  11/25/2008   Right knee - Severe degenerative arthritis with a varus deformity of the right knee --  Kipp Brood. [name], M.D.      reports that he quit smoking about 36 years ago. He has never used smokeless tobacco. He reports that he does not drink alcohol or use drugs.  No Known Allergies  Family History  Problem Relation Age of  Onset  . Cancer Daughter     colon ca   Family history reviewed and not pertinent  Prior to Admission medications   Medication Sig Start Date End Date Taking? Authorizing Provider  acetaminophen (ACETAMINOPHEN 8 HOUR) 650 MG CR tablet Take 1,300 mg by mouth every 8 (eight) hours as needed for pain.   Yes Historical Provider, MD  allopurinol (ZYLOPRIM) 300 MG tablet Take 300 mg by mouth daily.     Yes Historical Provider, MD  aspirin 81 MG tablet Take 1 tablet (81 mg total) by mouth  daily. 10/13/13  Yes Thayer Headings, MD  atorvastatin (LIPITOR) 20 MG tablet TAKE 1 TABLET (20 MG TOTAL) BY MOUTH DAILY. 04/06/12  Yes Thayer Headings, MD  carvedilol (COREG) 25 MG tablet TAKE 1 TABLET (25 MG TOTAL) BY MOUTH 2 (TWO) TIMES DAILY. 12/26/14  Yes Thayer Headings, MD  doxazosin (CARDURA) 4 MG tablet Take 4 mg by mouth at bedtime.     Yes Historical Provider, MD  furosemide (LASIX) 80 MG tablet Take 80 mg by mouth daily.    Yes Historical Provider, MD  levofloxacin (LEVAQUIN) 500 MG tablet Take 500 mg by mouth daily. f tx 08/02/16  Yes Historical Provider, MD  Multiple Vitamins-Minerals (MULTIVITAMIN WITH MINERALS) tablet Take 100 tablets by mouth daily.     Yes Historical Provider, MD  NIFEdipine (ADALAT CC) 90 MG 24 hr tablet Take 90 mg by mouth daily.     Yes Historical Provider, MD  potassium chloride SA (K-DUR,KLOR-CON) 20 MEQ tablet Take 20 mEq by mouth 2 (two) times daily.     Yes Historical Provider, MD  PROAIR HFA 108 [phone] Base) MCG/ACT inhaler Inhale 2 puffs into the lungs every 6 (six) hours as needed for shortness of breath. 08/02/16  Yes Historical Provider, MD  losartan (COZAAR) 100 MG tablet Take 100 mg by mouth every other day.     Historical Provider, MD  oxyCODONE (OXY IR/ROXICODONE) 5 MG immediate release tablet Take 1 tablet (5 mg total) by mouth every 6 (six) hours as needed for severe pain. Patient not taking: Reported on 08/11/2016 03/18/16   Heath Lark, MD    Physical Exam: Vitals:   08/11/16 0904 08/11/16 1134 08/11/16 1154 08/11/16 1210  BP:  (!) 99/44 (!) 104/51 (!) 109/54  Pulse:  91 88 88  Resp:  18 20 20   Temp:  98.6 F (37 C) 98.3 F (36.8 C) 98.6 F (37 C)  TempSrc:  Oral Oral Oral  SpO2:  96% 93% 95%  Weight: 88.5 kg (195 lb)     Height: 6' (1.829 m)       Constitutional: Frail appearing, NAD, lying comfortable Eyes: PERRL ENMT: Mucous membranes are dry Posterior pharynx clear of any exudate   Neck: normal, supple, no masses, no  thyromegaly Respiratory: clear to auscultation bilaterally, no wheezing, no crackles. Normal respiratory effort. No accessory muscle use.  Cardiovascular: [id number] RRR holosystolic murmur. No rubs or  gallops. 1+ LE edema. 2+ pedal pulses.  Abdomen: no tenderness, no masses palpated. No hepatosplenomegaly. Bowel sounds positive. Rectal deferred Musculoskeletal: no clubbing / cyanosis. No joint deformity upper and lower extremities.  Skin: Dry with no rashes, lesions, ulcers. Neurologic: CN 2-12 grossly intact. Strength 5/5 in all 4.  Psychiatric: Normal judgment and insight. Alert and oriented x 3. Normal mood.   Labs on Admission: I have personally reviewed following labs and imaging studies  CBC:  Recent Labs Lab 08/11/16 0940  WBC 3.8*  NEUTROABS  3.1  HGB 4.1*  HCT 13.0*  MCV 78.8  PLT 68*   Basic Metabolic Panel:  Recent Labs Lab 08/11/16 1107  NA 145  K 3.9  CL 119*  CO2 16*  GLUCOSE 160*  BUN 73*  CREATININE 2.15*  CALCIUM 8.5*   GFR: Estimated Creatinine Clearance: 30.1 mL/min (by C-G formula based on SCr of 2.15 mg/dL (H)). Liver Function Tests:  Recent Labs Lab 08/11/16 1107  AST 33  ALT 15*  ALKPHOS 32*  BILITOT 0.9  PROT 6.2*  ALBUMIN 2.7*    Recent Labs  08/11/16 1046  RETICCTPCT 3.2*   Urine analysis:    Component Value Date/Time   COLORURINE YELLOW 12/01/2008 1911   APPEARANCEUR CLEAR 12/01/2008 1911   LABSPEC 1.010 12/01/2008 1911   PHURINE 5.5 12/01/2008 1911   GLUCOSEU NEGATIVE 12/01/2008 1911   HGBUR TRACE (A) 12/01/2008 1911   BILIRUBINUR NEGATIVE 12/01/2008 1911   KETONESUR NEGATIVE 12/01/2008 1911   PROTEINUR NEGATIVE 12/01/2008 1911   UROBILINOGEN 1.0 12/01/2008 1911   NITRITE NEGATIVE 12/01/2008 1911   LEUKOCYTESUR NEGATIVE 12/01/2008 1911    Radiological Exams on Admission: Dg Chest 2 View  Result Date: 08/11/2016 CLINICAL DATA:  Pain all over.  Recent pneumonia. EXAM: CHEST  2 VIEW COMPARISON:  08/02/2016 FINDINGS:  cardiomegaly. Prior CABG. No confluent airspace opacities or effusions. No acute bony abnormality. IMPRESSION: Cardiomegaly.  No acute findings. Electronically Signed   By: Rolm Baptise M.D.   On: 08/11/2016 09:28    EKG: Independently reviewed - normal sinus rhythm, low voltage, a lot of artifact. No acute ST segment changes  Assessment/Plan Acute on chronic symptomatic anemia - Hgb of 4 at time of admission, likely related to upper GI bleed given black stool indicating melena. Also patient with increasing heartburn. Patient denies NSAIDs Admit to stepdown I agree with transfusion, monitor CBC and repeat if hemoglobin < 7. Will give Lasix with interpretation given history of diastolic dysfunction  Monitor CBC  Orthostatics  Hold ASA Follow up anemia panel   Melena - likely upper GI bleed, from gastritis vs PUD GI consult recommendations appreciated Protonix IV BID Clear liquids  NPO after midnight   Myelodysplastic syndrome Followed by Dr Alvy Bimler as outpatient  Monitor   Chronic diastolic CHF, seems to be compensated lungs appear to be clear, although 1+ LE edema and BNP 415 Continue Lasix  Hold BB as BP in the low side  Holding Losartan - unknown last baseline Cr in 2016 was 1.4 today 2.1 Monitor for sighs of fluid overload - given blood transfusion - will give lasix between and after transfusion   HTN - BP soft due to anemia  Hold Procardia, Coreg and Losartan  Monitor BP - will resume meds as BP start normalizing   CKD stage III - Last recorded Cr was 1.4 in 2016. Cr on admission 2.1 it could be prerenal azotemia from hypoperfusion  Patient getting transfuse will monitor Cr  Oral hydration encouraged  Patient with diastolic dysfunction and LE edema, hold IV hydration for now   DVT prophylaxis: SCD's  Code Status: FULL  Family Communication: Daughter and Granddaughter at bedside  Disposition Plan: Anticipate discharge to previous home environment.  Consults called: GI -  [place] Admission status: SDU inpatient    Chipper Oman MD [facility] Pager: Text Page via [email or web address]  [phone]  If 7PM-7AM, please contact night-coverage [email or web address] Password [email or web address]  08/11/2016, 1:34 PM

## 2016-08-11 NOTE — ED Notes (Signed)
Abnormal lab result MD Eulis Foster & PA Gertie Fey have been made aware

## 2016-08-11 NOTE — Consult Note (Signed)
Consultation  Referring Provider:  Dr. Patrecia Pour    Primary Care Physician:  Melinda Crutch, MD Primary Gastroenterologist:  Dr. Henrene Pastor       Reason for Consultation:  Melena, ABLA           HPI:   [name] is a 83 y.o. male with h/o anemia of chronic disease, CAD, DM, HLD, HTN, MDS and others listed below, who presented to the ER today with a complaint of "flu-like symptoms" for the past 3 weeks.    Today, at time of interview patient is surrounded by his family members. He tells me that for the past 2 weeks he has been having dark black, looser than normal stools. Sometimes 2-3 times a day. The patient's family members by his bedside are somewhat annoyed at him for not telling him that anything was wrong. He describes that over that time. He has also been starting to get more short of breath and "weak and tired". He also describes a subjective fever and chills as well as some aches, sore throat, congestion and cough.Patient also describes an associated increase in heartburn over the past couple of weeks.   According to his history patient saw his PCP over a week ago and had a chest x-ray that showed pneumonia and was subsequently discharged with Levaquin for 10 days. He is on his ninth day of taking this antibiotic what has seen no improvement.   Patient denies NSAIDs, previous episodes of the same,Nausea, vomiting or abdominal pain.  ED Course: Found to have a hgb of 4.1 which is a drop from 10.7 on 03/18/16, hemoccult + stool  Past GI History: 11/13/05-Colonoscopy, Dr. Henrene Pastor: Findings: Polyps, diverticulosis and internal hemorrhoids, repeat recommended in 3 yrs (prior colos x2 in records)  Past Medical History:  Diagnosis Date  . Anemia, chronic disease    Followed by Dr. Lamonte Sakai  . Back pain 01/25/2014  . Coronary artery disease    CABG x4  -- with Est. EF of 50-60%  --  07/06/2007  . Diabetes mellitus   . Gout   . Hyperlipidemia   . Hypertension    since his Mid -- 20's  . MDS  (myelodysplastic syndrome), low grade ([place])   . Mild aortic stenosis   . Osteoarthritis   . Renal insufficiency   . Thrombocytopenia ([place])     Past Surgical History:  Procedure Laterality Date  . CARDIAC CATHETERIZATION  06/29/2007    Left heart catheterization with selective coronary angiography, left ventricular angiography -- Probable normal left ventricular function and dilated aortic root -- Extensive and heavy calcification of coronary arteries --  Ludwig Lean. Doreatha Lew, M.D.       . CORONARY ARTERY BYPASS GRAFT  07/06/2007    x4 using a left internal mammary  artery graft to the left anterior descending coronary artery with a saphenous vein graft to the diagonal branch to the left anterior descending artery, a saphenous vein graft to the obtuse marginal branch of the left circumflex coronary artery, and a saphenous vein graft to  the posterior descending artery of the right coronary artery -  Gilford Raid, M.D  . TOTAL KNEE ARTHROPLASTY  11/25/2008   Right knee - Severe degenerative arthritis with a varus deformity of the right knee --  Kipp Brood. Gladstone Lighter, M.D.     Family History  Problem Relation Age of Onset  . Cancer Daughter     colon ca    Social History  Substance Use  Topics  . Smoking status: Former Smoker    Quit date: 07/01/1980  . Smokeless tobacco: Never Used  . Alcohol use No    Prior to Admission medications   Medication Sig Start Date End Date Taking? Authorizing Provider  acetaminophen (ACETAMINOPHEN 8 HOUR) 650 MG CR tablet Take 1,300 mg by mouth every 8 (eight) hours as needed for pain.   Yes Historical Provider, MD  allopurinol (ZYLOPRIM) 300 MG tablet Take 300 mg by mouth daily.     Yes Historical Provider, MD  aspirin 81 MG tablet Take 1 tablet (81 mg total) by mouth daily. 10/13/13  Yes Thayer Headings, MD  atorvastatin (LIPITOR) 20 MG tablet TAKE 1 TABLET (20 MG TOTAL) BY MOUTH DAILY. 04/06/12  Yes Thayer Headings, MD  carvedilol (COREG) 25 MG tablet TAKE 1  TABLET (25 MG TOTAL) BY MOUTH 2 (TWO) TIMES DAILY. 12/26/14  Yes Thayer Headings, MD  doxazosin (CARDURA) 4 MG tablet Take 4 mg by mouth at bedtime.     Yes Historical Provider, MD  furosemide (LASIX) 80 MG tablet Take 80 mg by mouth daily.    Yes Historical Provider, MD  levofloxacin (LEVAQUIN) 500 MG tablet Take 500 mg by mouth daily. f tx 08/02/16  Yes Historical Provider, MD  Multiple Vitamins-Minerals (MULTIVITAMIN WITH MINERALS) tablet Take 100 tablets by mouth daily.     Yes Historical Provider, MD  NIFEdipine (ADALAT CC) 90 MG 24 hr tablet Take 90 mg by mouth daily.     Yes Historical Provider, MD  potassium chloride SA (K-DUR,KLOR-CON) 20 MEQ tablet Take 20 mEq by mouth 2 (two) times daily.     Yes Historical Provider, MD  PROAIR HFA 108 [phone] Base) MCG/ACT inhaler Inhale 2 puffs into the lungs every 6 (six) hours as needed for shortness of breath. 08/02/16  Yes Historical Provider, MD  losartan (COZAAR) 100 MG tablet Take 100 mg by mouth every other day.     Historical Provider, MD  oxyCODONE (OXY IR/ROXICODONE) 5 MG immediate release tablet Take 1 tablet (5 mg total) by mouth every 6 (six) hours as needed for severe pain. Patient not taking: Reported on 08/11/2016 03/18/16   Heath Lark, MD    No current facility-administered medications for this encounter.    Current Outpatient Prescriptions  Medication Sig Dispense Refill  . acetaminophen (ACETAMINOPHEN 8 HOUR) 650 MG CR tablet Take 1,300 mg by mouth every 8 (eight) hours as needed for pain.    Marland Kitchen allopurinol (ZYLOPRIM) 300 MG tablet Take 300 mg by mouth daily.      Marland Kitchen aspirin 81 MG tablet Take 1 tablet (81 mg total) by mouth daily.    Marland Kitchen atorvastatin (LIPITOR) 20 MG tablet TAKE 1 TABLET (20 MG TOTAL) BY MOUTH DAILY. 30 tablet 6  . carvedilol (COREG) 25 MG tablet TAKE 1 TABLET (25 MG TOTAL) BY MOUTH 2 (TWO) TIMES DAILY. 60 tablet 6  . doxazosin (CARDURA) 4 MG tablet Take 4 mg by mouth at bedtime.      . furosemide (LASIX) 80 MG tablet Take 80  mg by mouth daily.     Marland Kitchen levofloxacin (LEVAQUIN) 500 MG tablet Take 500 mg by mouth daily. f tx  0  . Multiple Vitamins-Minerals (MULTIVITAMIN WITH MINERALS) tablet Take 100 tablets by mouth daily.      Marland Kitchen NIFEdipine (ADALAT CC) 90 MG 24 hr tablet Take 90 mg by mouth daily.      . potassium chloride SA (K-DUR,KLOR-CON) 20 MEQ tablet Take 20 mEq  by mouth 2 (two) times daily.      Marland Kitchen PROAIR HFA 108 (90 Base) MCG/ACT inhaler Inhale 2 puffs into the lungs every 6 (six) hours as needed for shortness of breath.  0  . losartan (COZAAR) 100 MG tablet Take 100 mg by mouth every other day.     . oxyCODONE (OXY IR/ROXICODONE) 5 MG immediate release tablet Take 1 tablet (5 mg total) by mouth every 6 (six) hours as needed for severe pain. (Patient not taking: Reported on 08/11/2016) 90 tablet 0    Allergies as of 08/11/2016  . (No Known Allergies)     Review of Systems:    Constitutional: Positive for weakness and fatigue Skin: No rash  Cardiovascular: No chest pain Respiratory:Positive for SOB and Cough Gastrointestinal: See HPI and otherwise negative Genitourinary: No dysuria or change in urinary frequency Neurological: No headache, dizziness or syncope Musculoskeletal: No new muscle or joint pain Hematologic: No bruising Psychiatric: No history of depression or anxiety   Physical Exam:  Vital signs in last 24 hours: Temp:  [98.3 F (36.8 C)-98.6 F (37 C)] 98.3 F (36.8 C) (02/11 1154) Pulse Rate:  [88-98] 88 (02/11 1154) Resp:  [18-20] 20 (02/11 1154) BP: (99-117)/(44-52) 104/51 (02/11 1154) SpO2:  [93 %-96 %] 93 % (02/11 1154) Weight:  [195 lb (88.5 kg)] 195 lb (88.5 kg) (02/11 0904)   General:   Pleasant elderly frail appearing African American male appears to be in NAD, Well developed, Well nourished, alert and cooperative Head:  Normocephalic and atraumatic. Eyes:   PEERL, EOMI. No icterus. Conjunctiva pink. Ears:  Normal auditory acuity. Neck:  Supple Throat: Oral cavity and  pharynx without inflammation, swelling or lesion. Dry mucous membranes Lungs: Respirations even and unlabored. Lungs clear to auscultation bilaterally.   No wheezes, crackles, or rhonchi.  Heart: Normal S1, S2. No MRG. Regular rate and rhythm. No peripheral edema, cyanosis or pallor.  Abdomen:  Soft, nondistended, nontender. No rebound or guarding. Normal bowel sounds. No appreciable masses or hepatomegaly. Rectal:  Not performed by me. (Performed by ED physician-brown color stool-hemoccult positive) Msk:  Symmetrical without gross deformities.  Extremities:  Without edema, no deformity or joint abnormality. Neurologic:  Alert and  oriented x4;  grossly normal neurologically.  Skin:   Dry and intact without significant lesions or rashes. Psychiatric:  Demonstrates good judgement and reason without abnormal affect or behaviors.   LAB RESULTS:  Recent Labs  08/11/16 0940  WBC 3.8*  HGB 4.1*  HCT 13.0*  PLT 68*   BMET  Recent Labs  08/11/16 1107  NA 145  K 3.9  CL 119*  CO2 16*  GLUCOSE 160*  BUN 73*  CREATININE 2.15*  CALCIUM 8.5*   LFT  Recent Labs  08/11/16 1107  PROT 6.2*  ALBUMIN 2.7*  AST 33  ALT 15*  ALKPHOS 32*  BILITOT 0.9   STUDIES: Dg Chest 2 View  Result Date: 08/11/2016 CLINICAL DATA:  Pain all over.  Recent pneumonia. EXAM: CHEST  2 VIEW COMPARISON:  08/02/2016 FINDINGS: cardiomegaly. Prior CABG. No confluent airspace opacities or effusions. No acute bony abnormality. IMPRESSION: Cardiomegaly.  No acute findings. Electronically Signed   By: Rolm Baptise M.D.   On: 08/11/2016 09:28     PREVIOUS ENDOSCOPIES:            See HPI   Impression / Plan:   Impression: 1. Acute on chronic Anemia:Patient's typical hemoglobin around 10, dropped to 4 time of admission, patient admits to 2  weeks of black stools and increase in heartburn symptoms, denies NSAIDs; likely this represents an upper GI bleed 2. Melena:See above; consider erosive esophagitis versus  gastritis versus PUD versus other  Plan: 1. Agree with transfusion now and monitoring hgb with further transfusion if <7 2. Will make pt NPO tonight for EGD tomorrow with Dr. Glyn Ade be on clears today 3. Agree with Pantoprazole 40mg  BID 4. Continue supportive measures including fluids 5. Await any further recommendations from Dr. Ardis Hughs  Thank you for your kind consultation, we will continue to follow.  Lavone Nian [facility]  08/11/2016, 12:07 PM Pager #: [phone]   ________________________________________________________________________  Velora Heckler GI MD note:  I personally examined the patient, reviewed the data and agree with the assessment and plan described above.  Melena for about 2 weeks.  Does not really take NSAIDs.  His plts are low and have been for months.  Perhaps underlying liver disease?  T bili is normal, he has never abused alcohol and has never been told he had liver disease. Will ask that coags be checked today, if very elevated INR then he should get FFP, VIT K and will start workup for chronic liver disease.   Agree with IV PPI twice daily for now.  Planning on EGD tomorrow.   Owens Loffler, MD [facility] Gastroenterology Pager [phone]

## 2016-08-12 ENCOUNTER — Inpatient Hospital Stay (HOSPITAL_COMMUNITY): Payer: Medicare Other | Admitting: Certified Registered Nurse Anesthetist

## 2016-08-12 ENCOUNTER — Encounter (HOSPITAL_COMMUNITY)
Admission: EM | Disposition: A | Discharge status: Home / Self Care | Payer: Self-pay | Source: Home / Self Care | Attending: Internal Medicine

## 2016-08-12 ENCOUNTER — Encounter (HOSPITAL_COMMUNITY): Payer: Self-pay | Admitting: Certified Registered Nurse Anesthetist

## 2016-08-12 DIAGNOSIS — K921 Melena: Secondary | ICD-10-CM

## 2016-08-12 DIAGNOSIS — D62 Acute posthemorrhagic anemia: Secondary | ICD-10-CM

## 2016-08-12 DIAGNOSIS — K922 Gastrointestinal hemorrhage, unspecified: Secondary | ICD-10-CM

## 2016-08-12 DIAGNOSIS — K2211 Ulcer of esophagus with bleeding: Principal | ICD-10-CM

## 2016-08-12 DIAGNOSIS — K254 Chronic or unspecified gastric ulcer with hemorrhage: Secondary | ICD-10-CM

## 2016-08-12 HISTORY — PX: ESOPHAGOGASTRODUODENOSCOPY (EGD) WITH PROPOFOL: SHX5813

## 2016-08-12 LAB — CBC
HCT: 23.2 % — ABNORMAL LOW (ref 39.0–52.0)
HEMOGLOBIN: 7.5 g/dL — AB (ref 13.0–17.0)
MCH: 25.9 pg — AB (ref 26.0–34.0)
MCHC: 32.3 g/dL (ref 30.0–36.0)
MCV: 80 fL (ref 78.0–100.0)
Platelets: 59 10*3/uL — ABNORMAL LOW (ref 150–400)
RBC: 2.9 MIL/uL — AB (ref 4.22–5.81)
RDW: 19.6 % — ABNORMAL HIGH (ref 11.5–15.5)
WBC: 3.7 10*3/uL — ABNORMAL LOW (ref 4.0–10.5)

## 2016-08-12 LAB — BASIC METABOLIC PANEL
ANION GAP: 8 (ref 5–15)
BUN: 58 mg/dL — ABNORMAL HIGH (ref 6–20)
CALCIUM: 8.8 mg/dL — AB (ref 8.9–10.3)
CO2: 18 mmol/L — AB (ref 22–32)
Chloride: 124 mmol/L — ABNORMAL HIGH (ref 101–111)
Creatinine, Ser: 1.8 mg/dL — ABNORMAL HIGH (ref 0.61–1.24)
GFR, EST AFRICAN AMERICAN: 39 mL/min — AB (ref >60)
GFR, EST NON AFRICAN AMERICAN: 34 mL/min — AB (ref >60)
Glucose, Bld: 121 mg/dL — ABNORMAL HIGH (ref 65–99)
Potassium: 3.7 mmol/L (ref 3.5–5.1)
Sodium: 150 mmol/L — ABNORMAL HIGH (ref 135–145)

## 2016-08-12 LAB — HEMOGLOBIN AND HEMATOCRIT, BLOOD
HEMATOCRIT: 17.3 % — AB (ref 39.0–52.0)
Hemoglobin: 5.4 g/dL — CL (ref 13.0–17.0)

## 2016-08-12 LAB — GLUCOSE, CAPILLARY: GLUCOSE-CAPILLARY: 155 mg/dL — AB (ref 65–99)

## 2016-08-12 SURGERY — ESOPHAGOGASTRODUODENOSCOPY (EGD) WITH PROPOFOL
Anesthesia: Monitor Anesthesia Care

## 2016-08-12 MED ORDER — MUPIROCIN 2 % EX OINT
1.0000 "application " | TOPICAL_OINTMENT | Freq: Two times a day (BID) | CUTANEOUS | Status: AC
  Administered 2016-08-12 – 2016-08-15 (×8): 1 via NASAL
  Filled 2016-08-12: qty 22

## 2016-08-12 MED ORDER — POTASSIUM CHLORIDE CRYS ER 20 MEQ PO TBCR
20.0000 meq | EXTENDED_RELEASE_TABLET | Freq: Two times a day (BID) | ORAL | Status: DC
  Administered 2016-08-12 – 2016-08-14 (×5): 20 meq via ORAL
  Filled 2016-08-12 (×4): qty 1

## 2016-08-12 MED ORDER — ATORVASTATIN CALCIUM 10 MG PO TABS
20.0000 mg | ORAL_TABLET | Freq: Every day | ORAL | Status: DC
  Administered 2016-08-12 – 2016-08-15 (×4): 20 mg via ORAL
  Filled 2016-08-12 (×4): qty 2

## 2016-08-12 MED ORDER — ORAL CARE MOUTH RINSE
15.0000 mL | Freq: Two times a day (BID) | OROMUCOSAL | Status: DC
  Administered 2016-08-12 – 2016-08-16 (×4): 15 mL via OROMUCOSAL

## 2016-08-12 MED ORDER — PROPOFOL 10 MG/ML IV BOLUS
INTRAVENOUS | Status: AC
Start: 2016-08-12 — End: 2016-08-12
  Filled 2016-08-12: qty 20

## 2016-08-12 MED ORDER — PROPOFOL 10 MG/ML IV BOLUS
INTRAVENOUS | Status: AC
  Filled 2016-08-12: qty 20

## 2016-08-12 MED ORDER — ALLOPURINOL 300 MG PO TABS
300.0000 mg | ORAL_TABLET | Freq: Every day | ORAL | Status: DC
  Administered 2016-08-12 – 2016-08-16 (×5): 300 mg via ORAL
  Filled 2016-08-12: qty 1
  Filled 2016-08-12: qty 3
  Filled 2016-08-12 (×2): qty 1

## 2016-08-12 MED ORDER — FUROSEMIDE 40 MG PO TABS
80.0000 mg | ORAL_TABLET | Freq: Every day | ORAL | Status: DC
  Administered 2016-08-12: 80 mg via ORAL

## 2016-08-12 MED ORDER — CHLORHEXIDINE GLUCONATE CLOTH 2 % EX PADS
6.0000 | MEDICATED_PAD | Freq: Every day | CUTANEOUS | Status: DC
  Administered 2016-08-14 – 2016-08-16 (×3): 6 via TOPICAL

## 2016-08-12 MED ORDER — PROPOFOL 500 MG/50ML IV EMUL
INTRAVENOUS | Status: DC | PRN
  Administered 2016-08-12: 100 ug/kg/min via INTRAVENOUS

## 2016-08-12 MED ORDER — LIDOCAINE 2% (20 MG/ML) 5 ML SYRINGE
INTRAMUSCULAR | Status: AC
  Filled 2016-08-12: qty 5

## 2016-08-12 MED ORDER — FLUCONAZOLE 100 MG PO TABS
100.0000 mg | ORAL_TABLET | Freq: Every day | ORAL | Status: DC
  Administered 2016-08-12 – 2016-08-16 (×5): 100 mg via ORAL
  Filled 2016-08-12 (×5): qty 1

## 2016-08-12 MED ORDER — ALLOPURINOL 100 MG PO TABS: 300.0000 mg | ORAL_TABLET | Freq: Every day | ORAL | Status: DC

## 2016-08-12 MED ORDER — BOOST / RESOURCE BREEZE PO LIQD
1.0000 | Freq: Two times a day (BID) | ORAL | Status: DC
  Administered 2016-08-12 – 2016-08-16 (×6): 1 via ORAL

## 2016-08-12 MED ORDER — LACTATED RINGERS IV SOLN
INTRAVENOUS | Status: DC | PRN
  Administered 2016-08-12 (×2): via INTRAVENOUS

## 2016-08-12 MED ORDER — PANTOPRAZOLE SODIUM 40 MG PO TBEC
40.0000 mg | DELAYED_RELEASE_TABLET | Freq: Two times a day (BID) | ORAL | Status: DC
  Administered 2016-08-12 – 2016-08-16 (×9): 40 mg via ORAL
  Filled 2016-08-12 (×13): qty 1

## 2016-08-12 MED ORDER — DOXAZOSIN MESYLATE 1 MG PO TABS: 4.0000 mg | ORAL_TABLET | Freq: Every day | ORAL | Status: DC

## 2016-08-12 MED ORDER — DOXAZOSIN MESYLATE 4 MG PO TABS
4.0000 mg | ORAL_TABLET | Freq: Every day | ORAL | Status: DC
  Administered 2016-08-12 – 2016-08-15 (×4): 4 mg via ORAL
  Filled 2016-08-12 (×4): qty 1

## 2016-08-12 MED ORDER — LIDOCAINE 2% (20 MG/ML) 5 ML SYRINGE
INTRAMUSCULAR | Status: DC | PRN
Start: 2016-08-12 — End: 2016-08-12
  Administered 2016-08-12: 100 mg via INTRAVENOUS

## 2016-08-12 MED ORDER — TAB-A-VITE/IRON PO TABS
1.0000 | ORAL_TABLET | Freq: Every day | ORAL | Status: DC
  Administered 2016-08-12 – 2016-08-15 (×4): 1 via ORAL
  Filled 2016-08-12 (×4): qty 1

## 2016-08-12 MED ORDER — SODIUM CHLORIDE 0.9 % IV SOLN: INTRAVENOUS | Status: DC

## 2016-08-12 MED ORDER — PROPOFOL 10 MG/ML IV BOLUS
INTRAVENOUS | Status: DC | PRN
  Administered 2016-08-12 (×2): 10 mg via INTRAVENOUS
  Administered 2016-08-12: 30 mg via INTRAVENOUS
  Administered 2016-08-12: 20 mg via INTRAVENOUS

## 2016-08-12 SURGICAL SUPPLY — 14 items

## 2016-08-12 NOTE — Progress Notes (Signed)
Initial Nutrition Assessment  DOCUMENTATION CODES:   Severe malnutrition in context of acute illness/injury  INTERVENTION:  - Will order Boost Breeze BID, each supplement provides 250 kcal and 9 grams of protein - Will order daily multivitamin with minerals. - Diet advancement as medically feasible.  NUTRITION DIAGNOSIS:   Inadequate oral intake related to acute illness, poor appetite as evidenced by per patient/family report.  GOAL:   Patient will meet greater than or equal to 90% of their needs  MONITOR:   PO intake, Supplement acceptance, Diet advancement, Weight trends, Labs, I & O's  REASON FOR ASSESSMENT:   Malnutrition Screening Tool  ASSESSMENT:   83 y.o. male with interval medical history as listed below. Pertinent PMHx myelodysplastic syndrome and anemia of chronic disease. Patient presented to the emergency room complaining of generalized weakness and shortness of breath. Patient report "flu like symptoms" for the past 2 - 3 weeks. One week ago when to his PCP who diagnosed pneumonia give him antibiotics and a steroid shot. Patient took 8 days of antibiotic treatment with no significant improvement. Two days prior to admission patient reports increase in shortness of breath and feeling more weak. Associated symptoms are chest tightness, subjective fever, and malaise. Patient report that he has been having black sticky stools persistently for the past 3 weeks with no gross bleeding. Denies nose bleed or bloody vomiting. Denies NSAID use or previous episodes of the same, no nausea or abdominal pain.  Pt seen for MST. BMI indicates normal weight. Pt had EGD this AM, pending results. Diet advanced from NPO to CLD today at 38. Daughter was at bedside at time of RD visit and provided all information as pt was sleeping soundly. She states pt has been sipping on liquids throughout the day. For the past ~3 weeks pt has not been feeling well and appetite began to gradually decline.  He was eating meals on some days and refusing foods on other days throughout the 3 week time period (usually on an every other day pattern), but in the few days PTA pt was mainly consuming a bowl of soup if family could get him to eat anything. He was encouraged by MD at outpatient appointment last week to drink plenty of fluids and no to worry about food. Because of this pt mainly focused on drinking large quantities of water throughout the past 1 week and family noticed this is when he stopped eating most days.   Daughter states pt did not mention nausea or abdominal pain with or without eating PTA. She denies him having any difficulties with chewing or swallowing.   Physical assessment unable to be performed at this time. RN assessment shows moderate edema to BLE. Daughter reports that over the summer pt was a few lbs over 200 lbs but that in the past few months he has been in the high 190s. She states that at outpatient MD appointment last week, pt weighed 194 lbs. Per chart review, pt has lost 13 lbs (6.7% body weight) in the past 1 week. This is significant for time frame. Pt meets criteria for malnutrition based on weight loss and <50% needed intakes for >/= 5 days.   Medications reviewed; 20 mg IV Lasix every 4 hours, 40 mg oral Protonix BID, 20 mEq oral KCl BID.  Labs reviewed; Na: 150 mmol/L, Cl: 124 mmol/L, BUN: 58 mg/dL, creatinine: 1.8 mg/dL, Ca: 8.8 mg/dL, GFR: 39 mL/min.    Diet Order:  Diet clear liquid Room service appropriate? Yes; Fluid  consistency: Thin  Skin:  Reviewed, no issues  Last BM:  2/11  Height:   Ht Readings from Last 1 Encounters:  08/12/16 6' (1.829 m)    Weight:   Wt Readings from Last 1 Encounters:  08/12/16 181 lb (82.1 kg)    Ideal Body Weight:  80.91 kg  BMI:  Body mass index is 24.55 kg/m.  Estimated Nutritional Needs:   Kcal:  1640-1805 (20-22 kcal/kg)  Protein:  70-80 grams  Fluid:  1.6-1.8 L/day  EDUCATION NEEDS:   No education  needs identified at this time    Jarome Matin, MS, RD, LDN, CNSC Inpatient Clinical Dietitian Pager # [phone] After hours/weekend pager # [phone]

## 2016-08-12 NOTE — Evaluation (Signed)
Physical Therapy Evaluation Patient Details Name: [name] MRN: [id number] DOB: [birth year] Today's Date: 08/12/2016   History of Present Illness   [name] is a 83 y.o. male with interval medical history as listed below. Pertinent PMHx myelodysplastic syndrome and anemia of chronic disease. Patient presented to the emergency room 08/01/16 complaining of generalized weakness and shortness of breath. Hemaglobin 4.1  Clinical Impression  The  Patient is weak today. Assisted to [facility], urgency for BM, some incontinence. Pt admitted with above diagnosis. Pt currently with functional limitations due to the deficits listed below (see PT Problem List).  Pt will benefit from skilled PT to increase their independence and safety with mobility to allow discharge to the venue listed below.       Follow Up Recommendations Home health PT;Supervision/Assistance - 24 hour    Equipment Recommendations  None recommended by PT    Recommendations for Other Services       Precautions / Restrictions Incontinence BM      Mobility  Bed Mobility Overal bed mobility: Needs Assistance Bed Mobility: Supine to Sit;Sit to Supine     Supine to sit: Min guard Sit to supine: Min guard   General bed mobility comments: Selfassist with use  of bed rails  Transfers Overall transfer level: Needs assistance Equipment used: 1 person hand held assist Transfers: Sit to/from [facility] Sit to Stand: Mod assist Stand pivot transfers: Mod assist       General transfer comment: mod assist to rise from bed, pivot to [facility], then back to bed. $ small side steps along the bed with HHA.   Ambulation/Gait                Stairs            Wheelchair Mobility    Modified Rankin (Stroke Patients Only)       Balance Overall balance assessment: Needs assistance Sitting-balance support: Feet supported;No upper extremity supported Sitting balance-Leahy Scale: Good     Standing  balance support: During functional activity;Single extremity supported Standing balance-Leahy Scale: Fair                               Pertinent Vitals/Pain Pain Assessment: No/denies pain    Home Living Family/patient expects to be discharged to:: Private residence Living Arrangements: Spouse/significant other Available Help at Discharge: Family Type of Home: House Home Access: Stairs to enter   Technical brewer of Steps: 1 Home Layout: One level Home Equipment: Cane - single point;Walker - 2 wheels Additional Comments: 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    Prior Function Level of Independence: Independent with assistive device(s)               Hand Dominance        Extremity/Trunk Assessment   Upper Extremity Assessment Upper Extremity Assessment: Generalized weakness    Lower Extremity Assessment Lower Extremity Assessment: Generalized weakness    Cervical / Trunk Assessment Cervical / Trunk Assessment: Normal  Communication   Communication: No difficulties  Cognition Arousal/Alertness: Awake/alert Behavior During Therapy: WFL for tasks assessed/performed Overall Cognitive Status: Within Functional Limits for tasks assessed                      General Comments      Exercises     Assessment/Plan    PT Assessment Patient needs continued PT services  PT Problem List Decreased  strength;Decreased activity tolerance;Decreased mobility;Decreased knowledge of precautions          PT Treatment Interventions DME instruction;Gait training;Functional mobility training;Therapeutic activities;Patient/family education    PT Goals (Current goals can be found in the Care Plan section)  Acute Rehab PT Goals Patient Stated Goal: to go home PT Goal Formulation: With patient Time For Goal Achievement: 08/26/16 Potential to Achieve Goals: Good    Frequency Min 3X/week   Barriers to discharge Decreased caregiver  support daughter lives close by    Co-evaluation               End of Session   Activity Tolerance: Patient tolerated treatment well Patient left: in bed;with call bell/phone within reach;with bed alarm set;with nursing/sitter in room Nurse Communication: Mobility status         Time: [id number] PT Time Calculation (min) (ACUTE ONLY): 24 min   Charges:   PT Evaluation $PT Eval Low Complexity: 1 Procedure PT Treatments $Therapeutic Activity: 8-22 mins   PT G Codes:        [name] 08/12/2016, 12:24 PM Tresa Endo PT [phone]

## 2016-08-12 NOTE — Transfer of Care (Signed)
Immediate Anesthesia Transfer of Care Note  Patient: [name]  Procedure(s) Performed: Procedure(s): ESOPHAGOGASTRODUODENOSCOPY (EGD) WITH PROPOFOL (N/A)  Patient Location: PACU  Anesthesia Type:MAC  Level of Consciousness: Patient easily awoken, sedated, comfortable, cooperative, following commands, responds to stimulation.   Airway & Oxygen Therapy: Patient spontaneously breathing, ventilating well, oxygen via simple oxygen mask.  Post-op Assessment: Report given to PACU RN, vital signs reviewed and stable, moving all extremities.   Post vital signs: Reviewed and stable.  Complications: No apparent anesthesia complications Last Vitals:  Vitals:   08/12/16 0858 08/12/16 0953  BP: (!) 127/48 (!) 98/37  Pulse: 92 89  Resp: (!) 21 (!) 23  Temp: 37.6 C 37.7 C    Last Pain:  Vitals:   08/12/16 0953  TempSrc: Oral  PainSc:          Complications: No apparent anesthesia complications

## 2016-08-12 NOTE — Op Note (Signed)
[facility] Patient Name: [name] Procedure Date: 08/12/2016 MRN: [id number] Attending MD: Mauri Pole , MD Date of Birth: January 21, 1934 CSN: [id number] Age: 83 Admit Type: Outpatient Procedure:                Upper GI endoscopy Indications:              Recent gastrointestinal bleeding, Suspected upper                            gastrointestinal bleeding Providers:                Mauri Pole, MD, Cleda Daub, RN,                            William Dalton, Technician Referring MD:              Medicines:                Monitored Anesthesia Care Complications:            No immediate complications. Estimated Blood Loss:     Estimated blood loss was minimal. Procedure:                Pre-Anesthesia Assessment:                           - Prior to the procedure, a History and Physical                            was performed, and patient medications and                            allergies were reviewed. The patient's tolerance of                            previous anesthesia was also reviewed. The risks                            and benefits of the procedure and the sedation                            options and risks were discussed with the patient.                            All questions were answered, and informed consent                            was obtained. Prior Anticoagulants: The patient has                            taken no previous anticoagulant or antiplatelet                            agents. ASA Grade Assessment: III - A patient with  severe systemic disease. After reviewing the risks                            and benefits, the patient was deemed in                            satisfactory condition to undergo the procedure.                           After obtaining informed consent, the endoscope was                            passed under direct vision. Throughout the   procedure, the patient's blood pressure, pulse, and                            oxygen saturations were monitored continuously. The                            [id number] scope was introduced through the                            mouth, and advanced to the second part of duodenum.                            The upper GI endoscopy was accomplished without                            difficulty. The patient tolerated the procedure                            well. Scope In: Scope Out: Findings:      Many superficial esophageal ulcers with oozing blood and stigmata of       recent bleeding associated with severe esophagitis and adherent while       plaques and nummular lesions were found in the entire esophagus from 20       to 40 cm from the incisors concerning for severe candida esophagitis.       Biopsies were taken with a cold forceps for histology. Possibel Zenker's       diverticula in the proximal esophagus. No evidence of esophageal varices      One non-bleeding superficial gastric ulcer with pigmented material was       found on the posterior wall of the stomach. The lesion was 6 mm in       largest dimension. No evidence of gastric varices      One non-bleeding cratered duodenal ulcer with no stigmata of bleeding       was found in the duodenal bulb. The lesion was 4 mm in largest dimension.      The second portion of the duodenum was normal. Impression:               - Bleeding esophageal ulcers with adherent plaques                            concerning for severe candida esophagitis.  Biopsied. Cannot exclude underlying mucosal lesions                            or malignancy.                           - Non-bleeding gastric ulcer with pigmented                            material.                           - One non-bleeding duodenal ulcer with no stigmata                            of bleeding.                           - Normal second portion  of the duodenum.                           -Likely etiology of anemia is upper GI bleed                            secondary to esophageal , gastric and duodenal                            ulcers ?secondary to NSAID's Moderate Sedation:      N/A- Per Anesthesia Care Recommendation:           - Advance as tolerated to mechanical soft diet.                           - Continue present medications. Ok to switch to                            oral PPI twice daily                           - Await pathology results.                           - No ibuprofen, naproxen, or other non-steroidal                            anti-inflammatory drugs.                           - Diflucan (fluconazole) 100 mg PO daily for 2                            weeks.                           - Return to GI office in 2-3 weeks (To be  scheduled, will arrange it with either Dr Henrene Pastor or                            Physician extender).                           - Will need repeat upper endoscopy in 2-3 months to                            check healing.                           - Will discuss at return office visit if he wants                            to pursue surveillance colonoscopy for history of                            colon polyps that is past due, he is very frail                            with multiple comorbidities.                           - Please call with any questions Procedure Code(s):        --- Professional ---                           [phone], Esophagogastroduodenoscopy, flexible,                            transoral; with biopsy, single or multiple Diagnosis Code(s):        --- Professional ---                           K22.11, Ulcer of esophagus with bleeding                           K25.9, Gastric ulcer, unspecified as acute or                            chronic, without hemorrhage or perforation                           K26.9, Duodenal ulcer, unspecified as  acute or                            chronic, without hemorrhage or perforation                           K92.2, Gastrointestinal hemorrhage, unspecified CPT copyright 2016 [employer]. All rights reserved. The codes documented in this report are preliminary and upon coder review may  be revised to meet current compliance requirements. Mauri Pole, MD 08/12/2016 10:04:55 AM This report has been signed electronically. Number of Addenda: 0

## 2016-08-12 NOTE — Progress Notes (Signed)
PT Cancellation Note  Patient Details Name: [name] MRN: [id number] DOB: Oct 10, 1933   Cancelled Treatment:    Reason Eval/Treat Not Completed: Patient not medically ready (post endo procedure this AM. will check back another time. )   [name] 08/12/2016, 10:27 AM Tresa Endo PT [phone]

## 2016-08-12 NOTE — Anesthesia Postprocedure Evaluation (Signed)
Anesthesia Post Note  Patient: [name]  Procedure(s) Performed: Procedure(s) (LRB): ESOPHAGOGASTRODUODENOSCOPY (EGD) WITH PROPOFOL (N/A)  Patient location during evaluation: PACU Anesthesia Type: MAC Level of consciousness: awake and alert Pain management: pain level controlled Vital Signs Assessment: post-procedure vital signs reviewed and stable Respiratory status: spontaneous breathing, nonlabored ventilation, respiratory function stable and patient connected to nasal cannula oxygen Cardiovascular status: stable and blood pressure returned to baseline Anesthetic complications: no       Last Vitals:  Vitals:   08/12/16 0858 08/12/16 0953  BP: (!) 127/48 (!) 98/37  Pulse: 92 89  Resp: (!) 21 (!) 23  Temp: 37.6 C 37.7 C    Last Pain:  Vitals:   08/12/16 0953  TempSrc: Oral  PainSc:                  Matthieu Loftus S

## 2016-08-12 NOTE — Anesthesia Preprocedure Evaluation (Addendum)
Anesthesia Evaluation  Patient identified by MRN, date of birth, ID band Patient awake    Reviewed: Allergy & Precautions, NPO status , Patient's Chart, lab work & pertinent test results  Airway Mallampati: II  TM Distance: >3 FB Neck ROM: Full    Dental no notable dental hx.    Pulmonary neg pulmonary ROS, former smoker,    Pulmonary exam normal breath sounds clear to auscultation       Cardiovascular hypertension, + CAD and + CABG  Normal cardiovascular exam+ Valvular Problems/Murmurs AS  Rhythm:Regular Rate:Normal     Neuro/Psych negative neurological ROS  negative psych ROS   GI/Hepatic negative GI ROS, Neg liver ROS,   Endo/Other  diabetes  Renal/GU negative Renal ROS  negative genitourinary   Musculoskeletal negative musculoskeletal ROS (+)   Abdominal   Peds negative pediatric ROS (+)  Hematology  (+) anemia , thrombocytopenia   Anesthesia Other Findings   Reproductive/Obstetrics negative OB ROS                            Anesthesia Physical Anesthesia Plan  ASA: III  Anesthesia Plan: MAC   Post-op Pain Management:    Induction: Intravenous  Airway Management Planned: Nasal Cannula  Additional Equipment:   Intra-op Plan:   Post-operative Plan:   Informed Consent: I have reviewed the patients History and Physical, chart, labs and discussed the procedure including the risks, benefits and alternatives for the proposed anesthesia with the patient or authorized representative who has indicated his/her understanding and acceptance.   Dental advisory given  Plan Discussed with: CRNA and Surgeon  Anesthesia Plan Comments:         Anesthesia Quick Evaluation

## 2016-08-12 NOTE — H&P (Signed)
[facility] History and Physical   Primary Care Physician:  Melinda Crutch, MD   Reason for Procedure:   Melena, symptomatic anemia Plan:    EGD with possible intervention    HPI: [name] is a 83 y.o. male admitted with severe anemia Hgb 4 with flu like symptoms and melena. S/p 4 u pRBC hgb responded appropriately to 7.5. He had a BM this morning and was light brown to yellow per patient.    Past Medical History:  Diagnosis Date  . Anemia, chronic disease    Followed by Dr. Lamonte Sakai  . Back pain 01/25/2014  . Coronary artery disease    CABG x4  -- with Est. EF of 50-60%  --  07/06/2007  . Diabetes mellitus   . Gout   . Hyperlipidemia   . Hypertension    since his Mid -- 20's  . MDS (myelodysplastic syndrome), low grade ([place])   . Mild aortic stenosis   . Osteoarthritis   . Renal insufficiency   . Thrombocytopenia ([place])     Past Surgical History:  Procedure Laterality Date  . CARDIAC CATHETERIZATION  06/29/2007    Left heart catheterization with selective coronary angiography, left ventricular angiography -- Probable normal left ventricular function and dilated aortic root -- Extensive and heavy calcification of coronary arteries --  Ludwig Lean. Doreatha Lew, M.D.       . CORONARY ARTERY BYPASS GRAFT  07/06/2007    x4 using a left internal mammary  artery graft to the left anterior descending coronary artery with a saphenous vein graft to the diagonal branch to the left anterior descending artery, a saphenous vein graft to the obtuse marginal branch of the left circumflex coronary artery, and a saphenous vein graft to  the posterior descending artery of the right coronary artery -  Gilford Raid, M.D  . TOTAL KNEE ARTHROPLASTY  11/25/2008   Right knee - Severe degenerative arthritis with a varus deformity of the right knee --  Kipp Brood. Gladstone Lighter, M.D.     Prior to Admission medications   Medication Sig Start Date End Date Taking? Authorizing Provider  acetaminophen  (ACETAMINOPHEN 8 HOUR) 650 MG CR tablet Take 1,300 mg by mouth every 8 (eight) hours as needed for pain.   Yes Historical Provider, MD  allopurinol (ZYLOPRIM) 300 MG tablet Take 300 mg by mouth daily.     Yes Historical Provider, MD  aspirin 81 MG tablet Take 1 tablet (81 mg total) by mouth daily. 10/13/13  Yes Thayer Headings, MD  atorvastatin (LIPITOR) 20 MG tablet TAKE 1 TABLET (20 MG TOTAL) BY MOUTH DAILY. 04/06/12  Yes Thayer Headings, MD  carvedilol (COREG) 25 MG tablet TAKE 1 TABLET (25 MG TOTAL) BY MOUTH 2 (TWO) TIMES DAILY. 12/26/14  Yes Thayer Headings, MD  doxazosin (CARDURA) 4 MG tablet Take 4 mg by mouth at bedtime.     Yes Historical Provider, MD  furosemide (LASIX) 80 MG tablet Take 80 mg by mouth daily.    Yes Historical Provider, MD  levofloxacin (LEVAQUIN) 500 MG tablet Take 500 mg by mouth daily. f tx 08/02/16  Yes Historical Provider, MD  Multiple Vitamins-Minerals (MULTIVITAMIN WITH MINERALS) tablet Take 100 tablets by mouth daily.     Yes Historical Provider, MD  NIFEdipine (ADALAT CC) 90 MG 24 hr tablet Take 90 mg by mouth daily.     Yes Historical Provider, MD  potassium chloride SA (K-DUR,KLOR-CON) 20 MEQ tablet Take 20 mEq by mouth 2 (two) times  daily.     Yes Historical Provider, MD  PROAIR HFA 108 [phone] Base) MCG/ACT inhaler Inhale 2 puffs into the lungs every 6 (six) hours as needed for shortness of breath. 08/02/16  Yes Historical Provider, MD  losartan (COZAAR) 100 MG tablet Take 100 mg by mouth every other day.     Historical Provider, MD  oxyCODONE (OXY IR/ROXICODONE) 5 MG immediate release tablet Take 1 tablet (5 mg total) by mouth every 6 (six) hours as needed for severe pain. Patient not taking: Reported on 08/11/2016 03/18/16   Heath Lark, MD    Current Facility-Administered Medications  Medication Dose Route Frequency Provider Last Rate Last Dose  . [MAR Hold] allopurinol (ZYLOPRIM) tablet 300 mg  300 mg Oral Daily Doreatha Lew, MD   300 mg at 08/11/16 1359  .  [MAR Hold] atorvastatin (LIPITOR) tablet 20 mg  20 mg Oral q1800 Doreatha Lew, MD   20 mg at 08/11/16 1745  . [MAR Hold] Chlorhexidine Gluconate Cloth 2 % PADS 6 each  6 each Topical Q0600 Doreatha Lew, MD   6 each at 08/12/16 0630  . [MAR Hold] doxazosin (CARDURA) tablet 4 mg  4 mg Oral QHS Doreatha Lew, MD   4 mg at 08/11/16 2158  . [MAR Hold] furosemide (LASIX) tablet 80 mg  80 mg Oral Daily Doreatha Lew, MD      . [facility] Hold] ipratropium-albuterol (DUONEB) 0.5-2.5 (3) MG/3ML nebulizer solution 3 mL  3 mL Nebulization Q6H PRN Doreatha Lew, MD      . [facility] Hold] MEDLINE mouth rinse  15 mL Mouth Rinse BID Doreatha Lew, MD      . [facility] Sou Hold] mupirocin ointment (BACTROBAN) 2 % 1 application  1 application Nasal BID Doreatha Lew, MD   1 application at [id number]  . [MAR Hold] pantoprazole (PROTONIX) injection 40 mg  40 mg Intravenous Q12H Lavone Nian Medora, [place]   40 mg at 08/11/16 2201  . [MAR Hold] potassium chloride SA (K-DUR,KLOR-CON) CR tablet 20 mEq  20 mEq Oral BID Doreatha Lew, MD   20 mEq at 08/11/16 2202    Allergies as of 08/11/2016  . (No Known Allergies)    Family History  Problem Relation Age of Onset  . Cancer Daughter     colon ca    Social History   Social History  . Marital status: Married    Spouse name: N/A  . Number of children: N/A  . Years of education: N/A   Occupational History  . Not on file.   Social History Main Topics  . Smoking status: Former Smoker    Quit date: 07/01/1980  . Smokeless tobacco: Never Used  . Alcohol use No  . Drug use: No  . Sexual activity: No   Other Topics Concern  . Not on file   Social History Narrative  . No narrative on file    Review of Systems: All other review of systems negative except as mentioned in the HPI.  Physical Exam: Vital signs in last 24 hours: Temp:  [98 F (36.7 C)-100.3 F (37.9 C)] 99.9 F (37.7 C) (02/12 0310) Pulse Rate:  [82-98] 94 (02/12  0800) Resp:  [17-26] 20 (02/12 0800) BP: (99-132)/(41-59) 116/50 (02/12 0800) SpO2:  [93 %-100 %] 100 % (02/12 0800) Weight:  [181 lb 10.5 oz (82.4 kg)-195 lb (88.5 kg)] 181 lb 10.5 oz (82.4 kg) (02/11 1345) Last BM Date: 08/11/16 General:   Alert, pleasant  and cooperative in NAD Lungs:  Clear throughout to auscultation.   Heart:  Regular rate and rhythm; no murmurs, clicks, rubs,  or gallops. Abdomen:  Soft, nontender and nondistended. Normal bowel sounds.   Neuro/Psych:  Alert and cooperative. Normal mood and affect. A and O x 3   @K .Denzil Magnuson, MD [phone] Mon-Fri 8a-5p [phone] after 5p, weekends, holidays 08/12/2016 9:00 AM@

## 2016-08-12 NOTE — Progress Notes (Signed)
- PROGRESS NOTE    [name]   [id number]  DOB: 01/22/1934  DOA: 08/11/2016 PCP: Melinda Crutch, MD   Brief Narrative:  [name] is a 83 y.o. male with interval medical history as listed below. Pertinent PMHx myelodysplastic syndrome and anemia of chronic disease. Patient presented to the emergency room complaining of generalized weakness and shortness of breath. Patient report "flu like symptoms" for the past 2 - 3 weeks. One week ago when to his PCP who diagnosed pneumonia give him antibiotics and a steroid shot. Patient took 8 days of antibiotic treatment with no significant improvement. Two days prior to admission patient reports increase in shortness of breath and feeling more weak. Associated symptoms are chest tightness, subjective fever, and malaise. Patient report that he has been having black sticky stools persistently for the past 3 weeks with no gross bleeding. Denies nose bleed or bloody vomiting. Denies NSAID use or previous episodes of the same, no nausea or abdominal pain.  Subjective: Had more black stool this AM. No abdominal pain or vomiting.   Assessment & Plan:   Principal Problem:   Melena - EGD performed today: noted to have esophageal ulcers with bleeding and candida, also has gastric and duodenal ulcers - cont PPI, Fluconazole added - follow for ongoing bleeding in SDU  Active Problems:   Acute blood loss anemia - transfused 4.1 u PRBC for  Hb 4 which is now 7.5 - cont to follow closely  AKI - Cr 2.15 on admission- improved to 1.80 - likely prerenal- check 2 D ECHO to determine need for Lasix    MDS (myelodysplastic syndrome), low grade    Pancytopenia  - per Dr Calton Dach last note in 9/17, he has stable disease and is not interested in chemo- plan wast to f/u in 6 mo  CAD s/p CABG  Gout - Allopurinol  HTN - holding Nifedipine, Losartan, Coreg  BPH - Cardura   DVT prophylaxis: SCD Code Status: Full code Family Communication:  daughter Disposition Plan: follow in SDU Consultants:   GI Procedures:  EGD:                           Bleeding esophageal ulcers with adherent plaques                            concerning for severe candida esophagitis.                            Biopsied. Cannot exclude underlying mucosal lesions                            or malignancy.                           - Non-bleeding gastric ulcer with pigmented                            material.                           - One non-bleeding duodenal ulcer with no stigmata  of bleeding.                           - Normal second portion of the duodenum.                           -Likely etiology of anemia is upper GI bleed                            secondary to esophageal , gastric and duodenal                            ulcers ?secondary to NSAID's   Antimicrobials:  Anti-infectives    Start     Dose/Rate Route Frequency Ordered Stop   08/12/16 1200  fluconazole (DIFLUCAN) tablet 100 mg     100 mg Oral Daily 08/12/16 1030         Objective: Vitals:   08/12/16 0954 08/12/16 1000 08/12/16 1010 08/12/16 1100  BP: (!) 109/45 (!) 99/47 (!) 112/46 (!) 126/54  Pulse: 90 90 89 90  Resp: (!) 21 (!) 22 (!) 23 20  Temp:      TempSrc:      SpO2: 98% 92% 96%   Weight:      Height:        Intake/Output Summary (Last 24 hours) at 08/12/16 1320 Last data filed at 08/12/16 0945  Gross per 24 hour  Intake             2701 ml  Output             2050 ml  Net              651 ml   Filed Weights   08/11/16 0904 08/11/16 1345 08/12/16 0858  Weight: 88.5 kg (195 lb) 82.4 kg (181 lb 10.5 oz) 82.1 kg (181 lb)    Examination: General exam: Appears comfortable  HEENT: PERRLA, oral mucosa moist, no sclera icterus or thrush Respiratory system: Clear to auscultation. Respiratory effort normal. Cardiovascular system: S1 & S2 heard, RRR.  No murmurs  Gastrointestinal system: Abdomen soft, non-tender, nondistended.  Normal bowel sound. No organomegaly Central nervous system: Alert and oriented. No focal neurological deficits. Extremities: No cyanosis, clubbing or edema Skin: No rashes or ulcers Psychiatry:  Mood & affect appropriate.     Data Reviewed: I have personally reviewed following labs and imaging studies  CBC:  Recent Labs Lab 08/11/16 0940 08/11/16 1929 08/12/16 0410  WBC 3.8*  --  4.5  NEUTROABS 3.1  --   --   HGB 4.1* 5.4* 7.5*  HCT 13.0* 17.3* 22.9*  MCV 78.8  --  82.1  PLT 68*  --  79*   Basic Metabolic Panel:  Recent Labs Lab 08/11/16 1107 08/12/16 0410  NA 145 150*  K 3.9 3.7  CL 119* 124*  CO2 16* 18*  GLUCOSE 160* 121*  BUN 73* 58*  CREATININE 2.15* 1.80*  CALCIUM 8.5* 8.8*   GFR: Estimated Creatinine Clearance: 35.9 mL/min (by C-G formula based on SCr of 1.8 mg/dL (H)). Liver Function Tests:  Recent Labs Lab 08/11/16 1107  AST 33  ALT 15*  ALKPHOS 32*  BILITOT 0.9  PROT 6.2*  ALBUMIN 2.7*   No results for input(s): LIPASE, AMYLASE in the last 168 hours. No results for input(s): AMMONIA in the  last 168 hours. Coagulation Profile:  Recent Labs Lab 08/11/16 0940  INR 1.34   Cardiac Enzymes: No results for input(s): CKTOTAL, CKMB, CKMBINDEX, TROPONINI in the last 168 hours. BNP (last 3 results) No results for input(s): PROBNP in the last 8760 hours. HbA1C: No results for input(s): HGBA1C in the last 72 hours. CBG:  Recent Labs Lab 08/11/16 1014  GLUCAP 155*   Lipid Profile: No results for input(s): CHOL, HDL, LDLCALC, TRIG, CHOLHDL, LDLDIRECT in the last 72 hours. Thyroid Function Tests: No results for input(s): TSH, T4TOTAL, FREET4, T3FREE, THYROIDAB in the last 72 hours. Anemia Panel:  Recent Labs  08/11/16 1046  VITAMINB12 2,828*  FOLATE 40.0  FERRITIN 57  TIBC 224*  IRON 16*  RETICCTPCT 3.2*   Urine analysis:    Component Value Date/Time   COLORURINE YELLOW 12/01/2008 1911   APPEARANCEUR CLEAR 12/01/2008 1911    LABSPEC 1.010 12/01/2008 1911   PHURINE 5.5 12/01/2008 1911   GLUCOSEU NEGATIVE 12/01/2008 1911   HGBUR TRACE (A) 12/01/2008 1911   BILIRUBINUR NEGATIVE 12/01/2008 1911   KETONESUR NEGATIVE 12/01/2008 1911   PROTEINUR NEGATIVE 12/01/2008 1911   UROBILINOGEN 1.0 12/01/2008 1911   NITRITE NEGATIVE 12/01/2008 1911   LEUKOCYTESUR NEGATIVE 12/01/2008 1911   Sepsis Labs: @LABRCNTIP (procalcitonin:4,lacticidven:4) ) Recent Results (from the past 240 hour(s))  Respiratory Panel by PCR     Status: None   Collection Time: 08/11/16  9:40 AM  Result Value Ref Range Status   Adenovirus NOT DETECTED NOT DETECTED Final   Coronavirus 229E NOT DETECTED NOT DETECTED Final   Coronavirus HKU1 NOT DETECTED NOT DETECTED Final   Coronavirus NL63 NOT DETECTED NOT DETECTED Final   Coronavirus OC43 NOT DETECTED NOT DETECTED Final   Metapneumovirus NOT DETECTED NOT DETECTED Final   Rhinovirus / Enterovirus NOT DETECTED NOT DETECTED Final   Influenza A NOT DETECTED NOT DETECTED Final   Influenza B NOT DETECTED NOT DETECTED Final   Parainfluenza Virus 1 NOT DETECTED NOT DETECTED Final   Parainfluenza Virus 2 NOT DETECTED NOT DETECTED Final   Parainfluenza Virus 3 NOT DETECTED NOT DETECTED Final   Parainfluenza Virus 4 NOT DETECTED NOT DETECTED Final   Respiratory Syncytial Virus NOT DETECTED NOT DETECTED Final   Bordetella pertussis NOT DETECTED NOT DETECTED Final   Chlamydophila pneumoniae NOT DETECTED NOT DETECTED Final   Mycoplasma pneumoniae NOT DETECTED NOT DETECTED Final    Comment: Performed at [facility], [place] [address], [place],  [address]  MRSA PCR Screening     Status: Abnormal   Collection Time: 08/11/16  1:44 PM  Result Value Ref Range Status   MRSA by PCR POSITIVE (A) NEGATIVE Final    Comment:        The GeneXpert MRSA Assay (FDA approved for NASAL specimens only), is one component of a comprehensive MRSA colonization surveillance program. It is not intended to diagnose  MRSA infection nor to guide or monitor treatment for MRSA infections. RESULT CALLED TO, READ BACK BY AND VERIFIED WITH: PACANIVEO,D AT 1615 ON [id number] BY HOOKER,B          Radiology Studies: Dg Chest 2 View  Result Date: 08/11/2016 CLINICAL DATA:  Pain all over.  Recent pneumonia. EXAM: CHEST  2 VIEW COMPARISON:  08/02/2016 FINDINGS: cardiomegaly. Prior CABG. No confluent airspace opacities or effusions. No acute bony abnormality. IMPRESSION: Cardiomegaly.  No acute findings. Electronically Signed   By: Rolm Baptise M.D.   On: 08/11/2016 09:28      Scheduled Meds: .  allopurinol  300 mg Oral Daily  . atorvastatin  20 mg Oral q1800  . [START ON 08/13/2016] Chlorhexidine Gluconate Cloth  6 each Topical Q0600  . doxazosin  4 mg Oral QHS  . fluconazole  100 mg Oral Daily  . furosemide  80 mg Oral Daily  . mouth rinse  15 mL Mouth Rinse BID  . mupirocin ointment  1 application Nasal BID  . pantoprazole  40 mg Oral BID  . potassium chloride SA  20 mEq Oral BID   Continuous Infusions:   LOS: 1 day    Time spent in minutes: 35    [place], MD [facility] Pager: [email or web address] Password [email or web address] 08/12/2016, 1:20 PM

## 2016-08-13 ENCOUNTER — Inpatient Hospital Stay (HOSPITAL_COMMUNITY): Payer: Medicare Other

## 2016-08-13 DIAGNOSIS — R0602 Shortness of breath: Secondary | ICD-10-CM

## 2016-08-13 DIAGNOSIS — E43 Unspecified severe protein-calorie malnutrition: Secondary | ICD-10-CM | POA: Insufficient documentation

## 2016-08-13 LAB — BASIC METABOLIC PANEL
ANION GAP: 7 (ref 5–15)
BUN: 44 mg/dL — ABNORMAL HIGH (ref 6–20)
CALCIUM: 8.6 mg/dL — AB (ref 8.9–10.3)
CO2: 19 mmol/L — ABNORMAL LOW (ref 22–32)
Chloride: 127 mmol/L — ABNORMAL HIGH (ref 101–111)
Creatinine, Ser: 1.5 mg/dL — ABNORMAL HIGH (ref 0.61–1.24)
GFR, EST AFRICAN AMERICAN: 49 mL/min — AB (ref >60)
GFR, EST NON AFRICAN AMERICAN: 42 mL/min — AB (ref >60)
GLUCOSE: 121 mg/dL — AB (ref 65–99)
POTASSIUM: 3.3 mmol/L — AB (ref 3.5–5.1)
SODIUM: 153 mmol/L — AB (ref 135–145)

## 2016-08-13 LAB — CBC
HCT: 23.5 % — ABNORMAL LOW (ref 39.0–52.0)
HEMATOCRIT: 22.9 % — AB (ref 39.0–52.0)
HEMATOCRIT: 23.4 % — AB (ref 39.0–52.0)
HEMOGLOBIN: 7.5 g/dL — AB (ref 13.0–17.0)
Hemoglobin: 7.5 g/dL — ABNORMAL LOW (ref 13.0–17.0)
Hemoglobin: 7.6 g/dL — ABNORMAL LOW (ref 13.0–17.0)
MCH: 26.9 pg (ref 26.0–34.0)
MCH: 26.4 pg (ref 26.0–34.0)
MCH: 25.9 pg — ABNORMAL LOW (ref 26.0–34.0)
MCHC: 32.8 g/dL (ref 30.0–36.0)
MCHC: 32.3 g/dL (ref 30.0–36.0)
MCHC: 32.1 g/dL (ref 30.0–36.0)
MCV: 82.1 fL (ref 78.0–100.0)
MCV: 81.6 fL (ref 78.0–100.0)
MCV: 80.7 fL (ref 78.0–100.0)
PLATELETS: 79 10*3/uL — AB (ref 150–400)
PLATELETS: 46 10*3/uL — AB (ref 150–400)
Platelets: 49 10*3/uL — ABNORMAL LOW (ref 150–400)
RBC: 2.79 MIL/uL — AB (ref 4.22–5.81)
RBC: 2.88 MIL/uL — AB (ref 4.22–5.81)
RBC: 2.9 MIL/uL — ABNORMAL LOW (ref 4.22–5.81)
RDW: 20 % — ABNORMAL HIGH (ref 11.5–15.5)
RDW: 19.9 % — AB (ref 11.5–15.5)
RDW: 20.2 % — ABNORMAL HIGH (ref 11.5–15.5)
WBC: 4.5 10*3/uL (ref 4.0–10.5)
WBC: 3.5 10*3/uL — AB (ref 4.0–10.5)
WBC: 3 10*3/uL — AB (ref 4.0–10.5)

## 2016-08-13 LAB — ECHOCARDIOGRAM COMPLETE
HEIGHTINCHES: 72 in
WEIGHTICAEL: 2896 [oz_av]

## 2016-08-13 LAB — TYPE AND SCREEN
Blood Product Expiration Date: 201802282359
Blood Product Expiration Date: 201803032359
Blood Product Expiration Date: 201803042359
Blood Product Expiration Date: 201803052359
ISSUE DATE / TIME: 201802111142
ISSUE DATE / TIME: 201802111434
ISSUE DATE / TIME: 201802112332
ISSUE DATE / TIME: 201802120046
UNIT TYPE AND RH: 6200
UNIT TYPE AND RH: 6200
Unit Type and Rh: 6200
Unit Type and Rh: 6200

## 2016-08-13 MED ORDER — CARVEDILOL 25 MG PO TABS
25.0000 mg | ORAL_TABLET | Freq: Two times a day (BID) | ORAL | Status: DC
  Administered 2016-08-13 – 2016-08-16 (×6): 25 mg via ORAL
  Filled 2016-08-13 (×7): qty 1

## 2016-08-13 MED ORDER — POTASSIUM CHLORIDE CRYS ER 20 MEQ PO TBCR
40.0000 meq | EXTENDED_RELEASE_TABLET | ORAL | Status: AC
  Administered 2016-08-13 (×2): 40 meq via ORAL
  Filled 2016-08-13 (×2): qty 2

## 2016-08-13 MED ORDER — LIP MEDEX EX OINT
TOPICAL_OINTMENT | CUTANEOUS | Status: AC
  Filled 2016-08-13: qty 7

## 2016-08-13 MED ORDER — DEXTROSE 5 % IV SOLN
INTRAVENOUS | Status: AC
  Administered 2016-08-13 – 2016-08-14 (×3): via INTRAVENOUS

## 2016-08-13 NOTE — Progress Notes (Signed)
Pt transferred to 1516, report given to [place], [place]

## 2016-08-13 NOTE — Progress Notes (Signed)
  Echocardiogram 2D Echocardiogram has been performed.  Darlina Sicilian M 08/13/2016, 2:45 PM

## 2016-08-13 NOTE — Progress Notes (Addendum)
Patient ID: [name], male   DOB: April 16, 1934, 83 y.o.   MRN: [id number]    Progress Note   Subjective   Up in chair eating breakfast - says he is feeling better, stronger- denies any dysphagia or odynophagia  HGB 7.6 stable  BX pending   Objective   Vital signs in last 24 hours: Temp:  [97.9 F (36.6 C)-99.9 F (37.7 C)] 97.9 F (36.6 C) (02/13 0800) Pulse Rate:  [89-99] 96 (02/13 0600) Resp:  [20-24] 21 (02/13 0600) BP: (98-132)/(37-60) 127/59 (02/13 0600) SpO2:  [92 %-100 %] 99 % (02/13 0400) Weight:  [181 lb (82.1 kg)] 181 lb (82.1 kg) (02/12 0858) Last BM Date: 08/11/16 General:   Elderly AA male in NAD Heart:  Regular rate and rhythm; no murmurs Lungs: Respirations even and unlabored, lungs CTA bilaterally Abdomen:  Soft, nontender and nondistended. Normal bowel sounds. Extremities:  Without edema. Neurologic:  Alert and oriented,  grossly normal neurologically. Psych:  Cooperative. Normal mood and affect.  Intake/Output from previous day: 02/12 0701 - 02/13 0700 In: 1620 [P.O.:1020; I.V.:600] Out: 1400 [Urine:1400] Intake/Output this shift: No intake/output data recorded.  Lab Results:  Recent Labs  08/12/16 0410 08/12/16 1425 08/13/16 0141  WBC 4.5 3.7* 3.5*  HGB 7.5* 7.5* 7.6*  HCT 22.9* 23.2* 23.5*  PLT 79* 59* 46*   BMET  Recent Labs  08/11/16 1107 08/12/16 0410 08/13/16 0141  NA 145 150* 153*  K 3.9 3.7 3.3*  CL 119* 124* 127*  CO2 16* 18* 19*  GLUCOSE 160* 121* 121*  BUN 73* 58* 44*  CREATININE 2.15* 1.80* 1.50*  CALCIUM 8.5* 8.8* 8.6*   LFT  Recent Labs  08/11/16 1107  PROT 6.2*  ALBUMIN 2.7*  AST 33  ALT 15*  ALKPHOS 32*  BILITOT 0.9   PT/INR  Recent Labs  08/11/16 0940  LABPROT 16.7*  INR 1.34    Studies/Results: Dg Chest 2 View  Result Date: 08/11/2016 CLINICAL DATA:  Pain all over.  Recent pneumonia. EXAM: CHEST  2 VIEW COMPARISON:  08/02/2016 FINDINGS: cardiomegaly. Prior CABG. No confluent airspace  opacities or effusions. No acute bony abnormality. IMPRESSION: Cardiomegaly.  No acute findings. Electronically Signed   By: Rolm Baptise M.D.   On: 08/11/2016 09:28       Assessment / Plan:     83 yo AA male with acute Upper Gi bleed secondary to multiple esophageal ulcers, severe esophagitis , non bleeding gastric and duodenal  Ulcers Possible esophageal candidiasis - bx pending , covering with Diflucan No active bleeding  Hgb stable  Plan; Advance to full liquids today, and soft diet in am  Follow up bx Continue Diflucan until bx reviewed BID PPI x 2 months,then daily Will arrange office follow up in one month - to follow up labs and discuss follow up EGD and possible Colon.   Principal Problem:   Melena Active Problems:   MDS (myelodysplastic syndrome), low grade (HCC)   Pancytopenia (HCC)   Gastric ulcer with hemorrhage but without obstruction   Ulcer of esophagus with bleeding   Acute blood loss anemia   Protein-calorie malnutrition, severe     LOS: 2 days   Amy Esterwood  08/13/2016, 8:57 AM

## 2016-08-13 NOTE — Progress Notes (Signed)
Physical Therapy Treatment Patient Details Name: DANEN NICANOR MRN: [id number] DOB: 02/22/1934 Today's Date: 08/13/2016    History of Present Illness  DEYVI FICCO is a 83 y.o. male with interval medical history as listed below. Pertinent PMHx myelodysplastic syndrome and anemia of chronic disease. Patient presented to the emergency room 08/01/16 complaining of generalized weakness and shortness of breath. Hemaglobin 4.1.    PT Comments    The patient is progressing in ambulation today in the room. Continue PT.  Follow Up Recommendations  Home health PT;Supervision/Assistance - 24 hour     Equipment Recommendations  None recommended by PT    Recommendations for Other Services  OT     Precautions / Restrictions Precautions Precautions: Fall    Mobility  Bed Mobility Overal bed mobility: Needs Assistance Bed Mobility: Supine to Sit     Supine to sit: Supervision     General bed mobility comments: Self assist with use  of bed rails  Transfers Overall transfer level: Needs assistance Equipment used: Rolling walker (2 wheeled) Transfers: Sit to/from Stand Sit to Stand: Min guard            Ambulation/Gait Ambulation/Gait assistance: Min guard Ambulation Distance (Feet): 20 Feet Assistive device: Rolling walker (2 wheeled) Gait Pattern/deviations: Step-through pattern     General Gait Details: able  to ambulate around the room with RW. HR 125   Stairs            Wheelchair Mobility    Modified Rankin (Stroke Patients Only)       Balance           Standing balance support: During functional activity;Single extremity supported Standing balance-Leahy Scale: Good                      Cognition Arousal/Alertness: Awake/alert                          Exercises      General Comments        Pertinent Vitals/Pain Pain Assessment: No/denies pain    Home Living                      Prior Function             PT Goals (current goals can now be found in the care plan section) Progress towards PT goals: Progressing toward goals    Frequency    Min 3X/week      PT Plan Current plan remains appropriate    Co-evaluation             End of Session   Activity Tolerance: Patient tolerated treatment well Patient left: in chair;with call bell/phone within reach;with chair alarm set     Time: [id number] PT Time Calculation (min) (ACUTE ONLY): 22 min  Charges:  $Gait Training: 8-22 mins                    G Codes:      [name] 08/13/2016, 1:09 PM Tresa Endo PT [phone]

## 2016-08-13 NOTE — Evaluation (Signed)
Occupational Therapy Evaluation Patient Details Name: [name] MRN: [id number] DOB: 01/20/1934 Today's Date: 08/13/2016    History of Present Illness  [name] is a 83 y.o. male was admitted with generalized weakness and SOB. Pertinent PMHx myelodysplastic syndrome and anemia of chronic disease. Patient presented to the emergency room 08/01/16 complaining of generalized weakness and shortness of breath. Hemaglobin 4.1 at admission   Clinical Impression   Pt was admitted for the above. He will benefit from continued OT in acute and follow up [place].  Pt was mod I prior to admission, and he currently needs mostly min A, with occasional mod A.  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  Goals in acute are for supervision level    Follow Up Recommendations  Home health OT;Supervision/Assistance - 24 hour    Equipment Recommendations  None recommended by OT    Recommendations for Other Services       Precautions / Restrictions Precautions Precautions: Fall Restrictions Weight Bearing Restrictions: No      Mobility Bed Mobility Overal bed mobility: Needs Assistance Bed Mobility: Supine to Sit     Supine to sit: Supervision Sit to supine: Supervision   General bed mobility comments: Self assist with use  of bed rails  Transfers Overall transfer level: Needs assistance Equipment used: Rolling walker (2 wheeled) Transfers: Sit to/from Stand Sit to Stand: Min guard         General transfer comment: for safety    Balance           Standing balance support: During functional activity;Single extremity supported Standing balance-Leahy Scale: Good                              ADL Overall ADL's : Needs assistance/impaired     Grooming: Set up;Wash/dry hands;Sitting   Upper Body Bathing: Set up;Sitting   Lower Body Bathing: Minimal assistance;Sit to/from stand   Upper Body Dressing : Set up;Sitting   Lower Body Dressing: Minimal  assistance;Sit to/from stand       Toileting- Water quality scientist and Hygiene: Moderate assistance;Sit to/from stand         General ADL Comments: pt was soiled due to bowel incontinence. Assisted him with cleaning up and donning new gown.  Pt has had a busy day and was transferred up from ICU/SDU     Vision     Perception     Praxis      Pertinent Vitals/Pain Pain Assessment: No/denies pain     Hand Dominance     Extremity/Trunk Assessment Upper Extremity Assessment Upper Extremity Assessment: Generalized weakness           Communication Communication Communication: No difficulties   Cognition Arousal/Alertness: Awake/alert Behavior During Therapy: WFL for tasks assessed/performed Overall Cognitive Status: Within Functional Limits for tasks assessed                     General Comments       Exercises       Shoulder Instructions      Home Living Family/patient expects to be discharged to:: Private residence Living Arrangements: Spouse/significant other Available Help at Discharge: Family Type of Home: House             Bathroom Shower/Tub: Tub/shower unit Shower/tub characteristics: Architectural technologist: Standard     Home Equipment: Toilet riser;Shower seat   Additional Comments: 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  Shower seat fits completely in tub      Prior Functioning/Environment Level of Independence: Independent with assistive device(s)                 OT Problem List: Decreased strength;Decreased activity tolerance;Impaired balance (sitting and/or standing);Decreased knowledge of use of DME or AE   OT Treatment/Interventions: Self-care/ADL training;Energy conservation;DME and/or AE instruction;Patient/family education;Balance training;Therapeutic activities    OT Goals(Current goals can be found in the care plan section) Acute Rehab OT Goals Patient Stated Goal: to go home OT Goal Formulation: With  patient Time For Goal Achievement: 08/20/16 Potential to Achieve Goals: Good ADL Goals Pt Will Perform Lower Body Bathing: with supervision;sit to/from stand Pt Will Perform Lower Body Dressing: with supervision;sit to/from stand Pt Will Transfer to Toilet: with supervision;bedside commode;ambulating Pt Will Perform Toileting - Clothing Manipulation and hygiene: with supervision;sit to/from stand Additional ADL Goal #1: pt will initiate at least one rest break for energy conservation  OT Frequency: Min 2X/week   Barriers to D/C:            Co-evaluation              End of Session    Activity Tolerance: Patient tolerated treatment well Patient left: in bed;with call bell/phone within reach;with family/visitor present   Time: 1443-1500 OT Time Calculation (min): 17 min Charges:  OT General Charges $OT Visit: 1 Procedure OT Evaluation $OT Eval Low Complexity: 1 Procedure G-Codes:    Bryston Colocho August 20, 2016, 3:19 PM  Lesle Chris, OTR/L [phone] Aug 20, 2016

## 2016-08-13 NOTE — Progress Notes (Addendum)
PROGRESS NOTE    [name]   [id number]  DOB: 09/29/1933  DOA: 08/11/2016 PCP: Melinda Crutch, MD   Brief Narrative:  [name] is a 83 y.o. male with PMHx myelodysplastic syndrome and anemia of chronic disease, CAD s/p CABG, Gout, GTH. Patient presented to the emergency room complaining of generalized weakness and shortness of breath. Patient report "flu like symptoms" for the past 2 - 3 weeks. One week ago when to his PCP who diagnosed pneumonia give him antibiotics and a steroid shot and 8 days of antibiotic treatment with no significant improvement. Two days prior to admission patient reports increase in shortness of breath and feeling more weak. Associated symptoms are chest tightness, subjective fever, and malaise. He has been having black sticky stools persistently for the past 3 weeks . Denies nose bleed or bloody vomiting. Denies NSAID use.  Subjective: Had more black stool in the middle of the night. No abdominal pain or vomiting.   Assessment & Plan:   Principal Problem:   Melena - EGD >>>  noted to have esophageal ulcers with bleeding and candida, also has gastric and duodenal ulcers - cont PPI, Fluconazole  - follow for ongoing bleeding - likely passed old blood last night - advance diet to full liquids- transfer to med/surg and hopefully home tomorrow  Active Problems:   Acute blood loss anemia - transfused 4 u PRBC for  Hb 4 which is now 7.5 - anemia penal consistent with AOCD - cont to follow closely  Hypernatremia/ hyperchloremia/ metabolic acidosis -slow 0000000 started today- hold off on Bicarb infusion and correct Cl first  Hypokalemia - replace today  AKI - Cr 2.15 on admission- improved to 1.50 - likely prerenal - check 2 D ECHO to determine need for Lasix- I would stop Lasix (on 80 mg daily) if he dose not have CHF on ECHO    MDS (myelodysplastic syndrome), low grade    Pancytopenia  - per Dr Calton Dach last note in 9/17, he has stable disease and  is not interested in chemo- plan wast to f/u in 6 mo - NOTE: platelets lower today likely due to consumption from GI bleed - follow and transfuse platelets if needed.   CAD s/p CABG  Gout - Allopurinol  HTN - holding Nifedipine, Losartan, Coreg - will resume Coreg today - cont Cardura  BPH - Cardura   DVT prophylaxis: SCD Code Status: Full code Family Communication: daughter Disposition Plan: follow in SDU Consultants:   GI Procedures:  EGD:                           Bleeding esophageal ulcers with adherent plaques                            concerning for severe candida esophagitis.                            Biopsied. Cannot exclude underlying mucosal lesions                            or malignancy.                           - Non-bleeding gastric ulcer with pigmented  material.                           - One non-bleeding duodenal ulcer with no stigmata                            of bleeding.                           - Normal second portion of the duodenum.                           -Likely etiology of anemia is upper GI bleed                            secondary to esophageal , gastric and duodenal                            ulcers ?secondary to NSAID's   Antimicrobials:  Anti-infectives    Start     Dose/Rate Route Frequency Ordered Stop   08/12/16 1200  fluconazole (DIFLUCAN) tablet 100 mg     100 mg Oral Daily 08/12/16 1030         Objective: Vitals:   08/13/16 0335 08/13/16 0400 08/13/16 0600 08/13/16 0800  BP:  132/60 (!) 127/59 (!) 140/54  Pulse:  97 96 93  Resp:  20 (!) 21 18  Temp: 99.8 F (37.7 C)   97.9 F (36.6 C)  TempSrc: Oral   Oral  SpO2:  99%  95%  Weight:      Height:        Intake/Output Summary (Last 24 hours) at 08/13/16 1025 Last data filed at 08/13/16 0800  Gross per 24 hour  Intake          1311.33 ml  Output             1725 ml  Net          -413.67 ml   Filed Weights   08/11/16 0904 08/11/16  1345 08/12/16 0858  Weight: 88.5 kg (195 lb) 82.4 kg (181 lb 10.5 oz) 82.1 kg (181 lb)    Examination: General exam: Appears comfortable  HEENT: PERRLA, oral mucosa moist, no sclera icterus or thrush Respiratory system: Clear to auscultation. Respiratory effort normal. Cardiovascular system: S1 & S2 heard, RRR.  No murmurs  Gastrointestinal system: Abdomen soft, non-tender, nondistended. Normal bowel sound. No organomegaly Central nervous system: Alert and oriented. No focal neurological deficits. Extremities: No cyanosis, clubbing or edema Skin: No rashes or ulcers Psychiatry:  Mood & affect appropriate.     Data Reviewed: I have personally reviewed following labs and imaging studies  CBC:  Recent Labs Lab 08/11/16 0940 08/11/16 1929 08/12/16 0410 08/12/16 1425 08/13/16 0141  WBC 3.8*  --  4.5 3.7* 3.5*  NEUTROABS 3.1  --   --   --   --   HGB 4.1* 5.4* 7.5* 7.5* 7.6*  HCT 13.0* 17.3* 22.9* 23.2* 23.5*  MCV 78.8  --  82.1 80.0 81.6  PLT 68*  --  79* 59* 46*   Basic Metabolic Panel:  Recent Labs Lab 08/11/16 1107 08/12/16 0410 08/13/16 0141  NA 145 150* 153*  K 3.9 3.7 3.3*  CL 119* 124* 127*  CO2 16*  18* 19*  GLUCOSE 160* 121* 121*  BUN 73* 58* 44*  CREATININE 2.15* 1.80* 1.50*  CALCIUM 8.5* 8.8* 8.6*   GFR: Estimated Creatinine Clearance: 43.1 mL/min (by C-G formula based on SCr of 1.5 mg/dL (H)). Liver Function Tests:  Recent Labs Lab 08/11/16 1107  AST 33  ALT 15*  ALKPHOS 32*  BILITOT 0.9  PROT 6.2*  ALBUMIN 2.7*   No results for input(s): LIPASE, AMYLASE in the last 168 hours. No results for input(s): AMMONIA in the last 168 hours. Coagulation Profile:  Recent Labs Lab 08/11/16 0940  INR 1.34   Cardiac Enzymes: No results for input(s): CKTOTAL, CKMB, CKMBINDEX, TROPONINI in the last 168 hours. BNP (last 3 results) No results for input(s): PROBNP in the last 8760 hours. HbA1C: No results for input(s): HGBA1C in the last 72  hours. CBG:  Recent Labs Lab 08/11/16 1014  GLUCAP 155*   Lipid Profile: No results for input(s): CHOL, HDL, LDLCALC, TRIG, CHOLHDL, LDLDIRECT in the last 72 hours. Thyroid Function Tests: No results for input(s): TSH, T4TOTAL, FREET4, T3FREE, THYROIDAB in the last 72 hours. Anemia Panel:  Recent Labs  08/11/16 1046  VITAMINB12 2,828*  FOLATE 40.0  FERRITIN 57  TIBC 224*  IRON 16*  RETICCTPCT 3.2*   Urine analysis:    Component Value Date/Time   COLORURINE YELLOW 12/01/2008 1911   APPEARANCEUR CLEAR 12/01/2008 1911   LABSPEC 1.010 12/01/2008 1911   PHURINE 5.5 12/01/2008 1911   GLUCOSEU NEGATIVE 12/01/2008 1911   HGBUR TRACE (A) 12/01/2008 1911   BILIRUBINUR NEGATIVE 12/01/2008 1911   KETONESUR NEGATIVE 12/01/2008 1911   PROTEINUR NEGATIVE 12/01/2008 1911   UROBILINOGEN 1.0 12/01/2008 1911   NITRITE NEGATIVE 12/01/2008 1911   LEUKOCYTESUR NEGATIVE 12/01/2008 1911   Sepsis Labs: @LABRCNTIP (procalcitonin:4,lacticidven:4) ) Recent Results (from the past 240 hour(s))  Respiratory Panel by PCR     Status: None   Collection Time: 08/11/16  9:40 AM  Result Value Ref Range Status   Adenovirus NOT DETECTED NOT DETECTED Final   Coronavirus 229E NOT DETECTED NOT DETECTED Final   Coronavirus HKU1 NOT DETECTED NOT DETECTED Final   Coronavirus NL63 NOT DETECTED NOT DETECTED Final   Coronavirus OC43 NOT DETECTED NOT DETECTED Final   Metapneumovirus NOT DETECTED NOT DETECTED Final   Rhinovirus / Enterovirus NOT DETECTED NOT DETECTED Final   Influenza A NOT DETECTED NOT DETECTED Final   Influenza B NOT DETECTED NOT DETECTED Final   Parainfluenza Virus 1 NOT DETECTED NOT DETECTED Final   Parainfluenza Virus 2 NOT DETECTED NOT DETECTED Final   Parainfluenza Virus 3 NOT DETECTED NOT DETECTED Final   Parainfluenza Virus 4 NOT DETECTED NOT DETECTED Final   Respiratory Syncytial Virus NOT DETECTED NOT DETECTED Final   Bordetella pertussis NOT DETECTED NOT DETECTED Final    Chlamydophila pneumoniae NOT DETECTED NOT DETECTED Final   Mycoplasma pneumoniae NOT DETECTED NOT DETECTED Final    Comment: Performed at [facility], [address] [place], [place] [address]  MRSA PCR Screening     Status: Abnormal   Collection Time: 08/11/16  1:44 PM  Result Value Ref Range Status   MRSA by PCR POSITIVE (A) NEGATIVE Final    Comment:        The GeneXpert MRSA Assay (FDA approved for NASAL specimens only), is one component of a comprehensive MRSA colonization surveillance program. It is not intended to diagnose MRSA infection nor to guide or monitor treatment for MRSA infections. RESULT CALLED TO, READ BACK BY AND  VERIFIED WITH: PACANIVEO,D AT [place] ON [id number] BY HOOKER,B          Radiology Studies: No results found.    Scheduled Meds: . allopurinol  300 mg Oral Daily  . atorvastatin  20 mg Oral q1800  . carvedilol  25 mg Oral BID WC  . Chlorhexidine Gluconate Cloth  6 each Topical Q0600  . doxazosin  4 mg Oral QHS  . feeding supplement  1 Container Oral BID BM  . fluconazole  100 mg Oral Daily  . mouth rinse  15 mL Mouth Rinse BID  . multivitamins with iron  1 tablet Oral Daily  . mupirocin ointment  1 application Nasal BID  . pantoprazole  40 mg Oral BID  . potassium chloride SA  20 mEq Oral BID  . potassium chloride  40 mEq Oral Q4H   Continuous Infusions: . dextrose 110 mL/hr at 08/13/16 0800     LOS: 2 days    Time spent in minutes: [place], MD [facility] Pager: [email or web address] Password [email or web address] 08/13/2016, 10:25 AM

## 2016-08-14 DIAGNOSIS — D61818 Other pancytopenia: Secondary | ICD-10-CM

## 2016-08-14 DIAGNOSIS — E43 Unspecified severe protein-calorie malnutrition: Secondary | ICD-10-CM

## 2016-08-14 DIAGNOSIS — D62 Acute posthemorrhagic anemia: Secondary | ICD-10-CM

## 2016-08-14 DIAGNOSIS — D4620 Refractory anemia with excess of blasts, unspecified: Secondary | ICD-10-CM

## 2016-08-14 DIAGNOSIS — K250 Acute gastric ulcer with hemorrhage: Secondary | ICD-10-CM

## 2016-08-14 LAB — CBC
HCT: 22.2 % — ABNORMAL LOW (ref 39.0–52.0)
HEMATOCRIT: 21.7 % — AB (ref 39.0–52.0)
HEMOGLOBIN: 7.1 g/dL — AB (ref 13.0–17.0)
Hemoglobin: 7 g/dL — ABNORMAL LOW (ref 13.0–17.0)
MCH: 26.6 pg (ref 26.0–34.0)
MCH: 26.5 pg (ref 26.0–34.0)
MCHC: 32.3 g/dL (ref 30.0–36.0)
MCHC: 32 g/dL (ref 30.0–36.0)
MCV: 82.5 fL (ref 78.0–100.0)
MCV: 82.8 fL (ref 78.0–100.0)
PLATELETS: 40 10*3/uL — AB (ref 150–400)
Platelets: 32 10*3/uL — ABNORMAL LOW (ref 150–400)
RBC: 2.63 MIL/uL — ABNORMAL LOW (ref 4.22–5.81)
RBC: 2.68 MIL/uL — ABNORMAL LOW (ref 4.22–5.81)
RDW: 20.2 % — AB (ref 11.5–15.5)
RDW: 20.4 % — AB (ref 11.5–15.5)
WBC: 2.5 10*3/uL — ABNORMAL LOW (ref 4.0–10.5)
WBC: 2.3 10*3/uL — ABNORMAL LOW (ref 4.0–10.5)

## 2016-08-14 LAB — COMPREHENSIVE METABOLIC PANEL
ALBUMIN: 2.6 g/dL — AB (ref 3.5–5.0)
ALK PHOS: 37 U/L — AB (ref 38–126)
ALT: 16 U/L — ABNORMAL LOW (ref 17–63)
AST: 30 U/L (ref 15–41)
Anion gap: 7 (ref 5–15)
BILIRUBIN TOTAL: 0.7 mg/dL (ref 0.3–1.2)
BUN: 32 mg/dL — ABNORMAL HIGH (ref 6–20)
CALCIUM: 8.5 mg/dL — AB (ref 8.9–10.3)
CO2: 16 mmol/L — AB (ref 22–32)
Chloride: 125 mmol/L — ABNORMAL HIGH (ref 101–111)
Creatinine, Ser: 1.35 mg/dL — ABNORMAL HIGH (ref 0.61–1.24)
GFR calc non Af Amer: 48 mL/min — ABNORMAL LOW (ref >60)
GFR, EST AFRICAN AMERICAN: 56 mL/min — AB (ref >60)
GLUCOSE: 126 mg/dL — AB (ref 65–99)
POTASSIUM: 4.1 mmol/L (ref 3.5–5.1)
SODIUM: 148 mmol/L — AB (ref 135–145)
TOTAL PROTEIN: 5.8 g/dL — AB (ref 6.5–8.1)

## 2016-08-14 MED ORDER — DEXTROSE 5 % IV SOLN
INTRAVENOUS | Status: AC
  Administered 2016-08-14 – 2016-08-15 (×3): via INTRAVENOUS

## 2016-08-14 NOTE — Progress Notes (Signed)
Occupational Therapy Treatment Patient Details Name: [name] MRN: [id number] DOB: October 27, 1933 Today's Date: 08/14/2016    History of present illness  [name] is a 83 y.o. male was admitted with generalized weakness and SOB. Pertinent PMHx myelodysplastic syndrome and anemia of chronic disease. Patient presented to the emergency room 08/01/16 complaining of generalized weakness and shortness of breath. Hemaglobin 4.1 at admission   OT comments  Pt felt tired today.  Did not want to perform ADL, but agreeable to sitting up in chair and performing theraband exercises.  Follow Up Recommendations  Home health OT;Supervision/Assistance - 24 hour    Equipment Recommendations  None recommended by OT    Recommendations for Other Services      Precautions / Restrictions Precautions Precautions: Fall Restrictions Weight Bearing Restrictions: No       Mobility Bed Mobility         Supine to sit: Supervision        Transfers   Equipment used: None   Sit to Stand: Min guard Stand pivot transfers: Min guard       General transfer comment: pt declined RW for SPT to chair    Balance                                   ADL                           Toilet Transfer: Min guard;Stand-pivot (chair)             General ADL Comments: pt did not feel up to doing ADL this am.  Agreeable to sitting up in chair and working on level one theraband      Tourist information centre manager   Behavior During Therapy: [facility] for tasks assessed/performed Overall Cognitive Status: Within Functional Limits for tasks assessed                       Extremity/Trunk Assessment               Exercises Other Exercises Other Exercises: level one theraband, one set x 10 of horizontal abduction and FF   Shoulder Instructions       General Comments      Pertinent Vitals/ Pain       Pain  Assessment: No/denies pain  Home Living Family/patient expects to be discharged to:: Private residence Living Arrangements: Spouse/significant other Available Help at Discharge: Family                                    Prior Functioning/Environment              Frequency  Min 2X/week        Progress Toward Goals  OT Goals(current goals can now be found in the care plan section)  Progress towards OT goals: Progressing toward goals  Acute Rehab OT Goals Time For Goal Achievement: 08/20/16 ADL Goals Additional ADL Goal #2: pt will perform level one theraband exercises with supervision, 1 set x 3 shoulder movements  Plan      Co-evaluation  End of Session     Activity Tolerance Patient tolerated treatment well   Patient Left in chair;with call bell/phone within reach   Nurse Communication          Time: [id number] OT Time Calculation (min): 21 min  Charges: OT General Charges $OT Visit: 1 Procedure OT Treatments $Therapeutic Activity: 8-22 mins  [name] 08/14/2016, 10:57 AM Lesle Chris, OTR/L [phone] 08/14/2016

## 2016-08-14 NOTE — Progress Notes (Signed)
PROGRESS NOTE    [name]  [id number] DOB: March 26, 1934 DOA: 08/11/2016 PCP: Adam Crutch, MD   Brief Narrative:  [name] a 83 y.o.malewith PMHx myelodysplastic syndrome and anemia of chronic disease, CAD s/p CABG, Gout and other comordids who pesented to the emergency room complaining of generalized weakness and shortness of breath. Patient report "flu like symptoms"for the past 2 - 3weeks. One week ago when to his PCP who diagnosedpneumonia give himantibiotics and a steroid shot and 8 daysof antibiotic treatment with no significant improvement. Twodays prior to admission patientreportsincrease in shortness of breath and feeling more weak. Associated symptoms are chest tightness, subjective fever, and malaise. He has been having black sticky stools persistently for the past 3 weeks. Denies nose bleed or bloody vomiting. Denies NSAID use admitted for melena and under went GI evaluation and found to have Bleeding esophageal ulcers with adherent plaqueconcerning for severe candida esophagitis as well as non-bleeding gastric and duodenal ulcers.   Assessment & Plan:   Principal Problem:   Melena Active Problems:   MDS (myelodysplastic syndrome), low grade (HCC)   Pancytopenia (HCC)   Gastric ulcer with hemorrhage but without obstruction   Ulcer of esophagus with bleeding   Acute blood loss anemia   Protein-calorie malnutrition, severe  Lower GIB/Melena from Esophogeal ulcers - EGD >>>  noted to have esophageal ulcers with bleeding and candida, also has gastric and duodenal ulcers - cont PPI Pantoprazole 40 mg po BID x2 months then daily - Fluconazole 100 mg po Daily until biopsy is reviewed - follow for ongoing bleeding - likely passed old blood on 08/12/12; Continue to follow Hb/Hct -Full Liquids advanced to Soft Diet -Repeat CBC in AM -Follow up with Gastroenterology in 1 month and discuss follow up EGD and possible Colonoscopy  Acute blood loss anemia -  transfused 4 u PRBC for  Hb 4 which is now 7.1 (possibly dilutional drop from D5W) - anemia penal consistent with AOCD - cont to follow closely and repeat CBC in AM  Hypernatremia/ hyperchloremia/ metabolic acidosis -Slightly improved -slow D5W started yesterday at 110 mL/hr; Reduced to 75 mL/hr now that patient is eating - hold off on Bicarb infusion and correct Cl first  Hypokalemia, improved -Was 3.3 and after it was replete was 4.1 -Continue to Monitor and Replete as necessary -Repeat CMP in AM  AKI - Cr 2.15 on admission- improved to 1.35 - likely prerenal; Avoid Nephrotoxics -C/w Gentle IVF Rehydration with D5W -Transthoracic ECHOcardiogram showed EF of 55-60% with Grade 1 Diastolic Dysfunction and incerased RVSP consistent with Moderate pHTN -Lasix 80 mg on Hold currently -Repeat CMP in AM  MDS (myelodysplastic syndrome), low grade and Pancytopenia  - per Dr [name] last note in 9/17, he has stable disease and is not interested in chemo- plan was to f/u in 6 mo - NOTE: platelets lower Down from 49-> 32 likely due to consumption from GI bleed - follow and transfuse platelets if needed. -Repeat CBC in AM and closely monitor  CAD s/p CABG -C/w Atorvastatin 20 mg po Daily and Carvedilol 25 mg po BID -ASA and Losartan held   Gout - C/w Allopurinol 300 mg po Daily  HTN - holding Nifedipine, Losartan - Resumed Carvedilol 25 mg po BID - Continue Doxazosin 4 mg po qHS  BPH - Continue Doxazosin 4 mg po qHS  Grade 1 Diastolic Dysfunction and Moderate pHTN -Strict I's and O's and Daily weights -Hold Lasix and continue gentle IVF Rehydration with D5W -Reassess Volume  Status in AM  DVT prophylaxis: SCDs Code Status: FULL CODE Family Communication: No Family present at bedside Disposition Plan: Home Health PT/OT when Stable for D/C; Likely in AM  Consultants:   Gastroenterology  Procedures:  EGD:                           Bleeding esophageal ulcers with  adherent plaques  concerning for severe candida esophagitis.  Biopsied. Cannot exclude underlying mucosal lesions  or malignancy. - Non-bleeding gastric ulcer with pigmented  material. - One non-bleeding duodenal ulcer with no stigmata  of bleeding. - Normal second portion of the duodenum. -Likely etiology of anemia is upper GI bleed  secondary to esophageal , gastric and duodenal  ulcers ?secondary to NSAID's  Antimicrobials:  Anti-infectives    Start     Dose/Rate Route Frequency Ordered Stop   08/12/16 1200  fluconazole (DIFLUCAN) tablet 100 mg     100 mg Oral Daily 08/12/16 1030       Subjective: Seen and examined at bedside and was doing well. No nausea or vomiting. No Abdominal Pain. Does not understand how he got ulcerations. No other concerns or complaints at this time.   Objective: Vitals:   08/13/16 1843 08/13/16 2050 08/14/16 0448 08/14/16 0756  BP: 131/61 130/71 136/61 (!) 144/66  Pulse: 83 83 100 83  Resp: (!) 23 20 18  (!) 22  Temp: 98.2 F (36.8 C) 98.7 F (37.1 C) 99.8 F (37.7 C) 98.3 F (36.8 C)  TempSrc: Oral Oral Oral Oral  SpO2: 100% 96% 100% 100%  Weight:      Height:        Intake/Output Summary (Last 24 hours) at 08/14/16 1502 Last data filed at 08/14/16 1400  Gross per 24 hour  Intake             1050 ml  Output             1000 ml  Net               50 ml   Filed Weights   08/11/16 0904 08/11/16 1345 08/12/16 0858  Weight: 88.5 kg (195 lb) 82.4 kg (181 lb 10.5 oz) 82.1 kg (181 lb)   Examination: Physical Exam:  Constitutional: NAD and appears calm and comfortable Eyes: Lids and conjunctivae normal, sclerae anicteric  ENMT: External Ears, Nose appear  normal. Grossly normal hearing.  Neck: Appears normal, supple, no cervical masses, normal ROM, no appreciable thyromegaly, no JVD Respiratory: Clear to auscultation bilaterally, no wheezing, rales, rhonchi or crackles. Normal respiratory effort and patient is not tachypenic. No accessory muscle use. Patient not tachypenic or using any accessory muscles to breathe Cardiovascular: RRR, no murmurs / rubs / gallops. S1 and S2 auscultated. No appreciable extremity edema. Abdomen: Soft, non-tender, non-distended. No masses palpated. No appreciable hepatosplenomegaly. Bowel sounds positive x4.  GU: Deferred. Musculoskeletal: No clubbing / cyanosis of digits/nails. No joint deformity upper and lower extremities.  Skin: No rashes, lesions, ulcers on limited skin evaluation. No induration; Warm and dry.  Neurologic: CN 2-12 grossly intact with no focal deficits. Sensation intact in all 4 Extremities. Romberg sign cerebellar reflexes not assessed.  Psychiatric: Normal judgment and insight. Alert and oriented x 3. Normal mood and appropriate affect.   Data Reviewed: I have personally reviewed following labs and imaging studies  CBC:  Recent Labs Lab 08/11/16 0940  08/12/16 1425 08/13/16 0141 08/13/16 1338 08/14/16 0152 08/14/16 1328  WBC  3.8*  < > 3.7* 3.5* 3.0* 2.5* 2.3*  NEUTROABS 3.1  --   --   --   --   --   --   HGB 4.1*  < > 7.5* 7.6* 7.5* 7.0* 7.1*  HCT 13.0*  < > 23.2* 23.5* 23.4* 21.7* 22.2*  MCV 78.8  < > 80.0 81.6 80.7 82.5 82.8  PLT 68*  < > 59* 46* 49* 40* 32*  < > = values in this interval not displayed. Basic Metabolic Panel:  Recent Labs Lab 08/11/16 1107 08/12/16 0410 08/13/16 0141 08/14/16 0152  NA 145 150* 153* 148*  K 3.9 3.7 3.3* 4.1  CL 119* 124* 127* 125*  CO2 16* 18* 19* 16*  GLUCOSE 160* 121* 121* 126*  BUN 73* 58* 44* 32*  CREATININE 2.15* 1.80* 1.50* 1.35*  CALCIUM 8.5* 8.8* 8.6* 8.5*   GFR: Estimated Creatinine Clearance: 47.9 mL/min (by C-G formula  based on SCr of 1.35 mg/dL (H)). Liver Function Tests:  Recent Labs Lab 08/11/16 1107 08/14/16 0152  AST 33 30  ALT 15* 16*  ALKPHOS 32* 37*  BILITOT 0.9 0.7  PROT 6.2* 5.8*  ALBUMIN 2.7* 2.6*   No results for input(s): LIPASE, AMYLASE in the last 168 hours. No results for input(s): AMMONIA in the last 168 hours. Coagulation Profile:  Recent Labs Lab 08/11/16 0940  INR 1.34   Cardiac Enzymes: No results for input(s): CKTOTAL, CKMB, CKMBINDEX, TROPONINI in the last 168 hours. BNP (last 3 results) No results for input(s): PROBNP in the last 8760 hours. HbA1C: No results for input(s): HGBA1C in the last 72 hours. CBG:  Recent Labs Lab 08/11/16 1014  GLUCAP 155*   Lipid Profile: No results for input(s): CHOL, HDL, LDLCALC, TRIG, CHOLHDL, LDLDIRECT in the last 72 hours. Thyroid Function Tests: No results for input(s): TSH, T4TOTAL, FREET4, T3FREE, THYROIDAB in the last 72 hours. Anemia Panel: No results for input(s): VITAMINB12, FOLATE, FERRITIN, TIBC, IRON, RETICCTPCT in the last 72 hours. Sepsis Labs:  Recent Labs Lab 08/11/16 1005 08/11/16 1405  LATICACIDVEN 2.14* 1.6    Recent Results (from the past 240 hour(s))  Respiratory Panel by PCR     Status: None   Collection Time: 08/11/16  9:40 AM  Result Value Ref Range Status   Adenovirus NOT DETECTED NOT DETECTED Final   Coronavirus 229E NOT DETECTED NOT DETECTED Final   Coronavirus HKU1 NOT DETECTED NOT DETECTED Final   Coronavirus NL63 NOT DETECTED NOT DETECTED Final   Coronavirus OC43 NOT DETECTED NOT DETECTED Final   Metapneumovirus NOT DETECTED NOT DETECTED Final   Rhinovirus / Enterovirus NOT DETECTED NOT DETECTED Final   Influenza A NOT DETECTED NOT DETECTED Final   Influenza B NOT DETECTED NOT DETECTED Final   Parainfluenza Virus 1 NOT DETECTED NOT DETECTED Final   Parainfluenza Virus 2 NOT DETECTED NOT DETECTED Final   Parainfluenza Virus 3 NOT DETECTED NOT DETECTED Final   Parainfluenza Virus 4  NOT DETECTED NOT DETECTED Final   Respiratory Syncytial Virus NOT DETECTED NOT DETECTED Final   Bordetella pertussis NOT DETECTED NOT DETECTED Final   Chlamydophila pneumoniae NOT DETECTED NOT DETECTED Final   Mycoplasma pneumoniae NOT DETECTED NOT DETECTED Final    Comment: Performed at [facility], [place] [address] [place], [place] [address]  MRSA PCR Screening     Status: Abnormal   Collection Time: 08/11/16  1:44 PM  Result Value Ref Range Status   MRSA by PCR POSITIVE (A) NEGATIVE Final  Comment:        The GeneXpert MRSA Assay (FDA approved for NASAL specimens only), is one component of a comprehensive MRSA colonization surveillance program. It is not intended to diagnose MRSA infection nor to guide or monitor treatment for MRSA infections. RESULT CALLED TO, READ BACK BY AND VERIFIED WITH: PACANIVEO,D AT 1615 ON [id number] BY HOOKER,B     Radiology Studies: No results found.  Scheduled Meds: . allopurinol  300 mg Oral Daily  . atorvastatin  20 mg Oral q1800  . carvedilol  25 mg Oral BID WC  . Chlorhexidine Gluconate Cloth  6 each Topical Q0600  . doxazosin  4 mg Oral QHS  . feeding supplement  1 Container Oral BID BM  . fluconazole  100 mg Oral Daily  . mouth rinse  15 mL Mouth Rinse BID  . multivitamins with iron  1 tablet Oral Daily  . mupirocin ointment  1 application Nasal BID  . pantoprazole  40 mg Oral BID  . potassium chloride SA  20 mEq Oral BID   Continuous Infusions:   LOS: 3 days   Kerney Elbe, DO [facility] Pager [phone]  If 7PM-7AM, please contact night-coverage [email or web address] Password [facility] 08/14/2016, 3:02 PM

## 2016-08-15 ENCOUNTER — Inpatient Hospital Stay (HOSPITAL_COMMUNITY): Payer: Medicare Other

## 2016-08-15 DIAGNOSIS — R509 Fever, unspecified: Secondary | ICD-10-CM

## 2016-08-15 LAB — PHOSPHORUS: PHOSPHORUS: 2.4 mg/dL — AB (ref 2.5–4.6)

## 2016-08-15 LAB — CBC WITH DIFFERENTIAL/PLATELET
BASOS PCT: 0 %
Basophils Absolute: 0 10*3/uL (ref 0.0–0.1)
EOS PCT: 0 %
Eosinophils Absolute: 0 10*3/uL (ref 0.0–0.7)
HCT: 19.8 % — ABNORMAL LOW (ref 39.0–52.0)
Hemoglobin: 6.4 g/dL — CL (ref 13.0–17.0)
LYMPHS ABS: 0.3 10*3/uL — AB (ref 0.7–4.0)
Lymphocytes Relative: 15 %
MCH: 26 pg (ref 26.0–34.0)
MCHC: 32.3 g/dL (ref 30.0–36.0)
MCV: 80.5 fL (ref 78.0–100.0)
MONO ABS: 0.2 10*3/uL (ref 0.1–1.0)
Monocytes Relative: 7 %
NEUTROS ABS: 1.8 10*3/uL (ref 1.7–7.7)
Neutrophils Relative %: 78 %
PLATELETS: 34 10*3/uL — AB (ref 150–400)
RBC: 2.46 MIL/uL — ABNORMAL LOW (ref 4.22–5.81)
RDW: 19.9 % — AB (ref 11.5–15.5)
WBC: 2.3 10*3/uL — AB (ref 4.0–10.5)

## 2016-08-15 LAB — COMPREHENSIVE METABOLIC PANEL
ALT: 20 U/L (ref 17–63)
AST: 31 U/L (ref 15–41)
Albumin: 2.3 g/dL — ABNORMAL LOW (ref 3.5–5.0)
Alkaline Phosphatase: 36 U/L — ABNORMAL LOW (ref 38–126)
Anion gap: 3 — ABNORMAL LOW (ref 5–15)
BUN: 27 mg/dL — ABNORMAL HIGH (ref 6–20)
CO2: 19 mmol/L — ABNORMAL LOW (ref 22–32)
Calcium: 8.1 mg/dL — ABNORMAL LOW (ref 8.9–10.3)
Chloride: 119 mmol/L — ABNORMAL HIGH (ref 101–111)
Creatinine, Ser: 1.29 mg/dL — ABNORMAL HIGH (ref 0.61–1.24)
GFR, EST AFRICAN AMERICAN: 59 mL/min — AB (ref >60)
GFR, EST NON AFRICAN AMERICAN: 51 mL/min — AB (ref >60)
Glucose, Bld: 119 mg/dL — ABNORMAL HIGH (ref 65–99)
POTASSIUM: 3.7 mmol/L (ref 3.5–5.1)
Sodium: 141 mmol/L (ref 135–145)
TOTAL PROTEIN: 5.6 g/dL — AB (ref 6.5–8.1)
Total Bilirubin: 1.2 mg/dL (ref 0.3–1.2)

## 2016-08-15 LAB — URINALYSIS, ROUTINE W REFLEX MICROSCOPIC
Bilirubin Urine: NEGATIVE
Glucose, UA: NEGATIVE mg/dL
Hgb urine dipstick: NEGATIVE
Ketones, ur: NEGATIVE mg/dL
Leukocytes, UA: NEGATIVE
Nitrite: NEGATIVE
Protein, ur: NEGATIVE mg/dL
Specific Gravity, Urine: 1.01 (ref 1.005–1.030)
pH: 5 (ref 5.0–8.0)

## 2016-08-15 LAB — PREPARE RBC (CROSSMATCH)

## 2016-08-15 LAB — MAGNESIUM: MAGNESIUM: 2.2 mg/dL (ref 1.7–2.4)

## 2016-08-15 MED ORDER — ENSURE ENLIVE PO LIQD
237.0000 mL | Freq: Two times a day (BID) | ORAL | Status: DC
  Administered 2016-08-15 – 2016-08-16 (×2): 237 mL via ORAL

## 2016-08-15 MED ORDER — SODIUM CHLORIDE 0.9 % IV BOLUS (SEPSIS)
250.0000 mL | Freq: Once | INTRAVENOUS | Status: AC
  Administered 2016-08-15: 250 mL via INTRAVENOUS

## 2016-08-15 MED ORDER — ACETAMINOPHEN 325 MG PO TABS
650.0000 mg | ORAL_TABLET | ORAL | Status: DC | PRN
  Administered 2016-08-15: 650 mg via ORAL
  Filled 2016-08-15: qty 2

## 2016-08-15 MED ORDER — SODIUM CHLORIDE 0.9 % IV SOLN: Freq: Once | INTRAVENOUS | Status: DC

## 2016-08-15 MED ORDER — ADULT MULTIVITAMIN W/MINERALS CH
1.0000 | ORAL_TABLET | Freq: Every day | ORAL | Status: DC
  Administered 2016-08-16: 1 via ORAL
  Filled 2016-08-15: qty 1

## 2016-08-15 NOTE — Progress Notes (Signed)
Nutrition Follow-up  DOCUMENTATION CODES:   Severe malnutrition in context of chronic illness  INTERVENTION:   Boost Breeze po TID, each supplement provides 250 kcal and 9 grams of protein  Ensure Enlive po BID, each supplement provides 350 kcal and 20 grams of protein  MVI  NUTRITION DIAGNOSIS:   Malnutrition related to chronic illness as evidenced by moderate to severe depletion of muscle mass, 9 percent weight loss in 5 months.  GOAL:   Patient will meet greater than or equal to 90% of their needs  MONITOR:   PO intake, Labs, Skin, Supplement acceptance  REASON FOR ASSESSMENT:   Malnutrition Screening Tool    ASSESSMENT:   83 yo AA male with acute Upper Gi bleed secondary to multiple esophageal ulcers, severe esophagitis , non bleeding gastric and duodenal Ulcers   Met with pt in room today. Pt advanced to soft diet. Pt reports eating 100% meals and drinking 100% of the Boost Breeze. Pt would like to try Ensure; RD will order. Per chart, pt has lost 17lbs(9%) in 5 months. This is significant given time frame. Continue to encourage supplement intake. Pt with low Phosphorus today. Continue to monitor and supplement as needed per MD discretion.   Medications reviewed and include: allopurinol, MVI, protonix  Labs reviewed: Cl 119(H), BUN 27(H), creat 1.29(H), Ca 8.1(L) adj. 9.46 wnl, P 2.4(L), Mg 2.2 wnl, Alb 2.3(L) WBC- 2.3(L), Hgb 6.4(L), Hct 19.8(L)  Nutrition-Focused physical exam completed. Findings are no fat depletion, severe muscle depletion, and no edema.   Diet Order:  DIET SOFT Room service appropriate? Yes; Fluid consistency: Thin  Skin:  Reviewed, no issues  Last BM:  2/14  Height:   Ht Readings from Last 1 Encounters:  08/12/16 6' (1.829 m)    Weight:   Wt Readings from Last 1 Encounters:  08/12/16 181 lb (82.1 kg)    Ideal Body Weight:  80.91 kg  BMI:  Body mass index is 24.55 kg/m.  Estimated Nutritional Needs:   Kcal:  1640-1805  (20-22 kcal/kg)  Protein:  70-80 grams  Fluid:  1.6-1.8 L/day  EDUCATION NEEDS:   No education needs identified at this time  Koleen Distance, RD, [place] Pager #- [phone]

## 2016-08-15 NOTE — Care Management Important Message (Signed)
Important Message  Patient Details IM Letter given to Sarah/Case Manager to present to Patient Name: GENIE SCHELER MRN: [id number] Date of Birth: 30-Apr-1934   Medicare Important Message Given:  Yes    Kerin Salen 08/15/2016, 12:26 [place] Message  Patient Details  Name: MUAAD KRAVCHENKO MRN: [id number] Date of Birth: 05/09/1934   Medicare Important Message Given:  Yes    Kerin Salen 08/15/2016, 12:26 PM

## 2016-08-15 NOTE — Progress Notes (Signed)
PROGRESS NOTE    MARKEECE Stone  [id number] DOB: 01-Feb-1934 DOA: 08/11/2016 PCP: Melinda Crutch, MD   Brief Narrative:  [name] a 83 y.o.malewith PMHx myelodysplastic syndrome and anemia of chronic disease, CAD s/p CABG, Gout and other comordids who pesented to the emergency room complaining of generalized weakness and shortness of breath. Patient report "flu like symptoms"for the past 2 - 3weeks. One week ago went to his PCP who diagnosedpneumonia give himantibiotics and a steroid shot and 8 daysof antibiotic treatment with no significant improvement. Twodays prior to admission patientreportsincrease in shortness of breath and feeling more weak. Associated symptoms are chest tightness, subjective fever, and malaise. He has been having black sticky stools persistently for the past 3 weeks. Denies nose bleed or bloody vomiting. Denies NSAID use admitted for melena and under went GI evaluation and found to have Bleeding esophageal ulcers with adherent plaqueconcerning for severe candida esophagitis as well as non-bleeding gastric and duodenal ulcers.   Assessment & Plan:   Principal Problem:   Melena Active Problems:   MDS (myelodysplastic syndrome), low grade (HCC)   Pancytopenia (HCC)   Gastric ulcer with hemorrhage but without obstruction   Ulcer of esophagus with bleeding   Acute blood loss anemia   Protein-calorie malnutrition, severe  Fever with recent diagnosis of Pneumonia -Temperature Spiked to 101.1 overnight; Acetaminophen po 650 mg po q4hprn for Mild Pain, Fever, headache -Panculture -Blood Cx x 2  Pending -CXR showed Cardiac silhouette is moderately enlarged unchanged. Tortuous calcified aorta. Pulmonary vascular congestion without pleural effusion or focal consolidation. Mildly elevated LEFT hemidiaphragm with LEFT lung base strandy densities. No pneumothorax. Soft tissue planes included osseous structures are nonsuspicious -Add Incentive Spirometry and  Flutter Valve; C/w Duoneb 3 mL q6hprn for Wheezing/SOB -Urinalysis Negative and Urine Cx Pending -Repeat CXR in AM -Respiratory Viral Panel on 08/11/16 Negative -Will attempt to get Sputum Cx  Lower GIB/Melena from Esophogeal ulcers - EGD >>>  noted to have esophageal ulcers with bleeding and candida, also has gastric and duodenal ulcers - cont PPI Pantoprazole 40 mg po BID x2 months then daily - Fluconazole 100 mg po Daily until biopsy is reviewed - follow for ongoing bleeding - likely passed old blood on 08/12/12; Continue to follow Hb/Hct -Full Liquids advanced to Soft Diet -Repeat CBC in AM -Follow up with Gastroenterology in 1 month and discuss follow up EGD and possible Colonoscopy -Has no reoccurrence of bleeding  Acute blood loss Anemia 2/2 to Above and MDS - transfused 4 u PRBC;  - Hb/Hct now 6.4/19.8 - Will transfuse 2 more units of pRBC's - anemia penal consistent with AOCD - cont to follow closely and repeat CBC in AM  Hypernatremia/ hyperchloremia/ metabolic acidosis -Na+ Improved at 141 and Chloride now 119 and Co2 improving -D/C'd D5W at 75 mL/hr now that patient is eating - hold off on Bicarb infusion and correct Cl first  Hypokalemia, improved -Patient's K+ 3.7  -Continue to Monitor and Replete as necessary -Repeat CMP in AM  AKI - Cr 2.15 on admission- improved to 1.29 - likely prerenal; Avoid Nephrotoxics -D/C'd Gentle IVF Rehydration with D5W; Patient getting 2 units of Blood -Transthoracic ECHOcardiogram showed EF of 55-60% with Grade 1 Diastolic Dysfunction and incerased RVSP consistent with Moderate pHTN -Lasix 80 mg on Hold currently -Repeat CMP in AM  MDS (myelodysplastic syndrome), low grade and Pancytopenia  - per Dr Calton Dach last note in 9/17, he has stable disease and is not interested in chemo- plan was  to f/u in 6 mo - NOTE: platelets lower Down from 49-> 34 likely due to consumption from GI bleed - follow and transfuse platelets if  needed. -Repeat CBC in AM and closely monitor  CAD s/p CABG -C/w Atorvastatin 20 mg po Daily and Carvedilol 25 mg po BID -ASA and Losartan held   Gout - C/w Allopurinol 300 mg po Daily  HTN - holding Nifedipine, Losartan - Resumed Carvedilol 25 mg po BID - Continue Doxazosin 4 mg po qHS  BPH - Continue Doxazosin 4 mg po qHS  Grade 1 Diastolic Dysfunction and Moderate pHTN -Strict I's and O's and Daily weights -Hold Lasix and continue gentle IVF Rehydration with D5W D/C'd; Getting 2 units of Blood -Reassess Volume Status in AM  DVT prophylaxis: SCDs Code Status: FULL CODE Family Communication: No Family present at bedside Disposition Plan: Home Health PT/OT when Stable for D/C; Likely in AM  Consultants:   Gastroenterology  Procedures:  EGD:                           Bleeding esophageal ulcers with adherent plaques  concerning for severe candida esophagitis.  Biopsied. Cannot exclude underlying mucosal lesions  or malignancy. - Non-bleeding gastric ulcer with pigmented  material. - One non-bleeding duodenal ulcer with no stigmata  of bleeding. - Normal second portion of the duodenum. -Likely etiology of anemia is upper GI bleed  secondary to esophageal , gastric and duodenal  ulcers ?secondary to NSAID's  Antimicrobials:  Anti-infectives    Start     Dose/Rate Route Frequency Ordered Stop   08/12/16 1200  fluconazole (DIFLUCAN) tablet 100 mg     100 mg Oral Daily 08/12/16 1030       Subjective: Seen and examined at bedside and was doing well. Had a fever overnight. No nausea or vomiting. No Abdominal Pain. No breathing difficulties. States he feels weak but no other concerns or  complaints at this time.   Objective: Vitals:   08/15/16 1140 08/15/16 1240 08/15/16 1303 08/15/16 1605  BP: (!) 134/55 (!) 134/54 (!) 137/54 (!) 142/56  Pulse: 66 69 69 72  Resp: 18 18 18 18   Temp: 98.5 F (36.9 C) 98.9 F (37.2 C) 98.5 F (36.9 C) 99.2 F (37.3 C)  TempSrc: Oral Oral Oral Oral  SpO2: 100% 100% 100% 99%  Weight:      Height:        Intake/Output Summary (Last 24 hours) at 08/15/16 1950 Last data filed at 08/15/16 1937  Gross per 24 hour  Intake           2737.5 ml  Output             1400 ml  Net           1337.5 ml   Filed Weights   08/11/16 0904 08/11/16 1345 08/12/16 0858  Weight: 88.5 kg (195 lb) 82.4 kg (181 lb 10.5 oz) 82.1 kg (181 lb)   Examination: Physical Exam:  Constitutional: NAD and appears calm and comfortable Eyes: Lids and conjunctivae normal, sclerae anicteric  ENMT: External Ears, Nose appear normal. Grossly normal hearing.  Neck: Appears normal, supple, no cervical masses, normal ROM, no appreciable thyromegaly, no JVD Respiratory: Diminished to auscultation bilaterally, no wheezing, rales, rhonchi or crackles. Normal respiratory effort and patient is not tachypenic. No accessory muscle use. Patient not tachypenic or using any accessory muscles to breathe Cardiovascular: RRR, no murmurs / rubs / gallops. S1  and S2 auscultated. No appreciable extremity edema.  Abdomen: Soft, non-tender, non-distended. No masses palpated. No appreciable hepatosplenomegaly. Bowel sounds positive x4.  GU: Deferred. Musculoskeletal: No clubbing / cyanosis of digits/nails. No joint deformity upper and lower extremities.  Skin: No rashes, lesions, ulcers on limited skin evaluation. No induration; Warm and dry.  Neurologic: CN 2-12 grossly intact with no focal deficits. Sensation intact in all 4 Extremities. Romberg sign cerebellar reflexes not assessed.  Psychiatric: Normal judgment and insight. Alert and oriented x 3. Normal mood and appropriate affect.    Data Reviewed: I have personally reviewed following labs and imaging studies  CBC:  Recent Labs Lab 08/11/16 0940  08/13/16 0141 08/13/16 1338 08/14/16 0152 08/14/16 1328 08/15/16 0205  WBC 3.8*  < > 3.5* 3.0* 2.5* 2.3* 2.3*  NEUTROABS 3.1  --   --   --   --   --  1.8  HGB 4.1*  < > 7.6* 7.5* 7.0* 7.1* 6.4*  HCT 13.0*  < > 23.5* 23.4* 21.7* 22.2* 19.8*  MCV 78.8  < > 81.6 80.7 82.5 82.8 80.5  PLT 68*  < > 46* 49* 40* 32* 34*  < > = values in this interval not displayed. Basic Metabolic Panel:  Recent Labs Lab 08/11/16 1107 08/12/16 0410 08/13/16 0141 08/14/16 0152 08/15/16 0205  NA 145 150* 153* 148* 141  K 3.9 3.7 3.3* 4.1 3.7  CL 119* 124* 127* 125* 119*  CO2 16* 18* 19* 16* 19*  GLUCOSE 160* 121* 121* 126* 119*  BUN 73* 58* 44* 32* 27*  CREATININE 2.15* 1.80* 1.50* 1.35* 1.29*  CALCIUM 8.5* 8.8* 8.6* 8.5* 8.1*  MG  --   --   --   --  2.2  PHOS  --   --   --   --  2.4*   GFR: Estimated Creatinine Clearance: 50.1 mL/min (by C-G formula based on SCr of 1.29 mg/dL (H)). Liver Function Tests:  Recent Labs Lab 08/11/16 1107 08/14/16 0152 08/15/16 0205  AST 33 30 31  ALT 15* 16* 20  ALKPHOS 32* 37* 36*  BILITOT 0.9 0.7 1.2  PROT 6.2* 5.8* 5.6*  ALBUMIN 2.7* 2.6* 2.3*   No results for input(s): LIPASE, AMYLASE in the last 168 hours. No results for input(s): AMMONIA in the last 168 hours. Coagulation Profile:  Recent Labs Lab 08/11/16 0940  INR 1.34   Cardiac Enzymes: No results for input(s): CKTOTAL, CKMB, CKMBINDEX, TROPONINI in the last 168 hours. BNP (last 3 results) No results for input(s): PROBNP in the last 8760 hours. HbA1C: No results for input(s): HGBA1C in the last 72 hours. CBG:  Recent Labs Lab 08/11/16 1014  GLUCAP 155*   Lipid Profile: No results for input(s): CHOL, HDL, LDLCALC, TRIG, CHOLHDL, LDLDIRECT in the last 72 hours. Thyroid Function Tests: No results for input(s): TSH, T4TOTAL, FREET4, T3FREE, THYROIDAB in the last  72 hours. Anemia Panel: No results for input(s): VITAMINB12, FOLATE, FERRITIN, TIBC, IRON, RETICCTPCT in the last 72 hours. Sepsis Labs:  Recent Labs Lab 08/11/16 1005 08/11/16 1405  LATICACIDVEN 2.14* 1.6    Recent Results (from the past 240 hour(s))  Respiratory Panel by PCR     Status: None   Collection Time: 08/11/16  9:40 AM  Result Value Ref Range Status   Adenovirus NOT DETECTED NOT DETECTED Final   Coronavirus 229E NOT DETECTED NOT DETECTED Final   Coronavirus HKU1 NOT DETECTED NOT DETECTED Final   Coronavirus NL63 NOT DETECTED NOT DETECTED Final   Coronavirus  OC43 NOT DETECTED NOT DETECTED Final   Metapneumovirus NOT DETECTED NOT DETECTED Final   Rhinovirus / Enterovirus NOT DETECTED NOT DETECTED Final   Influenza A NOT DETECTED NOT DETECTED Final   Influenza B NOT DETECTED NOT DETECTED Final   Parainfluenza Virus 1 NOT DETECTED NOT DETECTED Final   Parainfluenza Virus 2 NOT DETECTED NOT DETECTED Final   Parainfluenza Virus 3 NOT DETECTED NOT DETECTED Final   Parainfluenza Virus 4 NOT DETECTED NOT DETECTED Final   Respiratory Syncytial Virus NOT DETECTED NOT DETECTED Final   Bordetella pertussis NOT DETECTED NOT DETECTED Final   Chlamydophila pneumoniae NOT DETECTED NOT DETECTED Final   Mycoplasma pneumoniae NOT DETECTED NOT DETECTED Final    Comment: Performed at [facility], [place] [address], [place], [place] [address]  MRSA PCR Screening     Status: Abnormal   Collection Time: 08/11/16  1:44 PM  Result Value Ref Range Status   MRSA by PCR POSITIVE (A) NEGATIVE Final    Comment:        The GeneXpert MRSA Assay (FDA approved for NASAL specimens only), is one component of a comprehensive MRSA colonization surveillance program. It is not intended to diagnose MRSA infection nor to guide or monitor treatment for MRSA infections. RESULT CALLED TO, READ BACK BY AND VERIFIED WITH: PACANIVEO,D AT 1615 ON [id number] BY HOOKER,B     Radiology Studies: Dg Chest  Port 1 View  Result Date: 08/15/2016 CLINICAL DATA:  Fever.  History of hypertension, diabetes. EXAM: PORTABLE CHEST 1 VIEW COMPARISON:  Chest radiograph August 11, 2016 FINDINGS: Cardiac silhouette is moderately enlarged unchanged. Tortuous calcified aorta. Pulmonary vascular congestion without pleural effusion or focal consolidation. Mildly elevated LEFT hemidiaphragm with LEFT lung base strandy densities. No pneumothorax. Soft tissue planes included osseous structures are nonsuspicious. IMPRESSION: Stable cardiomegaly and pulmonary vascular congestion. LEFT lung base atelectasis. Electronically Signed   By: Elon Alas M.D.   On: 08/15/2016 06:15   Scheduled Meds: . sodium chloride   Intravenous Once  . sodium chloride   Intravenous Once  . allopurinol  300 mg Oral Daily  . atorvastatin  20 mg Oral q1800  . carvedilol  25 mg Oral BID WC  . Chlorhexidine Gluconate Cloth  6 each Topical Q0600  . doxazosin  4 mg Oral QHS  . feeding supplement  1 Container Oral BID BM  . feeding supplement (ENSURE ENLIVE)  237 mL Oral BID BM  . fluconazole  100 mg Oral Daily  . mouth rinse  15 mL Mouth Rinse BID  . [START ON 08/16/2016] multivitamin with minerals  1 tablet Oral Daily  . mupirocin ointment  1 application Nasal BID  . pantoprazole  40 mg Oral BID   Continuous Infusions:   LOS: 4 days   Kerney Elbe, DO [facility] Pager [phone]  If 7PM-7AM, please contact night-coverage [email or web address] Password [facility] 08/15/2016, 7:50 PM

## 2016-08-16 ENCOUNTER — Encounter (HOSPITAL_COMMUNITY): Payer: Self-pay | Admitting: Gastroenterology

## 2016-08-16 ENCOUNTER — Inpatient Hospital Stay (HOSPITAL_COMMUNITY): Payer: Medicare Other

## 2016-08-16 LAB — CBC WITH DIFFERENTIAL/PLATELET
BASOS ABS: 0 10*3/uL (ref 0.0–0.1)
Basophils Relative: 0 %
EOS ABS: 0 10*3/uL (ref 0.0–0.7)
EOS PCT: 1 %
HCT: 24.5 % — ABNORMAL LOW (ref 39.0–52.0)
HEMOGLOBIN: 8 g/dL — AB (ref 13.0–17.0)
LYMPHS PCT: 15 %
Lymphs Abs: 0.4 10*3/uL — ABNORMAL LOW (ref 0.7–4.0)
MCH: 26 pg (ref 26.0–34.0)
MCHC: 32.7 g/dL (ref 30.0–36.0)
MCV: 79.5 fL (ref 78.0–100.0)
Monocytes Absolute: 0.1 10*3/uL (ref 0.1–1.0)
Monocytes Relative: 4 %
NEUTROS PCT: 81 %
Neutro Abs: 2 10*3/uL (ref 1.7–7.7)
PLATELETS: 34 10*3/uL — AB (ref 150–400)
RBC: 3.08 MIL/uL — ABNORMAL LOW (ref 4.22–5.81)
RDW: 18 % — ABNORMAL HIGH (ref 11.5–15.5)
WBC: 2.5 10*3/uL — AB (ref 4.0–10.5)

## 2016-08-16 LAB — COMPREHENSIVE METABOLIC PANEL
ALT: 22 U/L (ref 17–63)
AST: 33 U/L (ref 15–41)
Albumin: 2.3 g/dL — ABNORMAL LOW (ref 3.5–5.0)
Alkaline Phosphatase: 44 U/L (ref 38–126)
Anion gap: 5 (ref 5–15)
BUN: 27 mg/dL — AB (ref 6–20)
CHLORIDE: 118 mmol/L — AB (ref 101–111)
CO2: 17 mmol/L — AB (ref 22–32)
CREATININE: 1.13 mg/dL (ref 0.61–1.24)
Calcium: 8 mg/dL — ABNORMAL LOW (ref 8.9–10.3)
GFR calc Af Amer: >60 mL/min (ref >60)
GFR calc non Af Amer: 59 mL/min — ABNORMAL LOW (ref >60)
Glucose, Bld: 122 mg/dL — ABNORMAL HIGH (ref 65–99)
Potassium: 3.3 mmol/L — ABNORMAL LOW (ref 3.5–5.1)
SODIUM: 140 mmol/L (ref 135–145)
Total Bilirubin: 1 mg/dL (ref 0.3–1.2)
Total Protein: 5.5 g/dL — ABNORMAL LOW (ref 6.5–8.1)

## 2016-08-16 LAB — TYPE AND SCREEN
BLOOD PRODUCT EXPIRATION DATE: 201803072359
BLOOD PRODUCT EXPIRATION DATE: 201803072359
ISSUE DATE / TIME: 201802150829
ISSUE DATE / TIME: 201802151238
UNIT TYPE AND RH: 6200
Unit Type and Rh: 6200

## 2016-08-16 LAB — PHOSPHORUS: PHOSPHORUS: 2.3 mg/dL — AB (ref 2.5–4.6)

## 2016-08-16 LAB — MAGNESIUM: MAGNESIUM: 2.1 mg/dL (ref 1.7–2.4)

## 2016-08-16 MED ORDER — PANTOPRAZOLE SODIUM 40 MG PO TBEC: 40.0000 mg | DELAYED_RELEASE_TABLET | Freq: Two times a day (BID) | ORAL | 0 refills | Status: AC

## 2016-08-16 MED ORDER — ENSURE ENLIVE PO LIQD: 237.0000 mL | Freq: Two times a day (BID) | ORAL | 12 refills | Status: DC

## 2016-08-16 MED ORDER — POTASSIUM CHLORIDE CRYS ER 20 MEQ PO TBCR
40.0000 meq | EXTENDED_RELEASE_TABLET | Freq: Two times a day (BID) | ORAL | Status: DC
  Administered 2016-08-16: 40 meq via ORAL
  Filled 2016-08-16: qty 2

## 2016-08-16 MED ORDER — BOOST / RESOURCE BREEZE PO LIQD
1.0000 | Freq: Two times a day (BID) | ORAL | 0 refills | Status: AC
Start: 2016-08-16 — End: ?

## 2016-08-16 MED ORDER — FLUCONAZOLE 100 MG PO TABS: 100.0000 mg | ORAL_TABLET | Freq: Every day | ORAL | 0 refills | Status: AC

## 2016-08-16 MED ORDER — NIFEDIPINE ER OSMOTIC RELEASE 90 MG PO TB24
90.0000 mg | ORAL_TABLET | Freq: Every day | ORAL | Status: DC
  Administered 2016-08-16: 90 mg via ORAL
  Filled 2016-08-16: qty 1

## 2016-08-16 NOTE — Progress Notes (Signed)
Spoke with patient at bedside. He states he and his wife live with his daughter, [name]. They will be at [address], [place], [place] [address]. He states he was still driving prior to admission, was independent of ADL's, ambulated with a cane, has a walker. He states he manages his own medicines. He has a PCP that he has seen recently. He is agreeable to [facility] services, he also states that I can speak with his daughter regarding choice of [place] agency. Spoke with daughter, [name], she states she will be taking the patient to live with her at the above address at d/c, she does not have a choice for [facility] agency, is looking at [facility] for custodial care for her parents but has not completed that process. Contacted [facility] for referral, they are unable to take. Contacted [facility], can provide services with start date of Monday 2/19.

## 2016-08-16 NOTE — Discharge Summary (Signed)
Physician Discharge Summary  [name] [id number] DOB: 08-28-1933 DOA: 08/11/2016  PCP: Melinda Crutch, MD  Admit date: 08/11/2016 Discharge date: 08/16/2016  Admitted From: Home Disposition: Home with [facility] and OT  Recommendations for Outpatient Follow-up:  1. Follow up with PCP in 1-2 weeks 2. Follow up with Gastroenterology in 1 month and take PPI twice a day for 2 months then daily after 3. Follow up Oncology Dr. Alvy Bimler for your MDS and Pancytopenia 4. Follow up with Cardiology as an outpatient 5. Please obtain BMP/CBC in one week 6. Please follow up on the following pending results: Urine and Blood Cx.   Home Health: YES Equipment/Devices: None  Discharge Condition: Stable CODE STATUS: FULL CODE Diet recommendation: Heart Healthy Soft Diet  Brief/Interim Summary: [name] a 83 y.o.malewith PMHx myelodysplastic syndrome and anemia of chronic disease, CAD s/p CABG, Gout and other comordids who pesented to the emergency room complaining of generalized weakness and shortness of breath. Patient report "flu like symptoms"for the past 2 - 3weeks. One week ago went to his PCP who diagnosedpneumonia give himantibiotics and a steroid shot and 8 daysof antibiotic treatment with no significant improvement. Twodays prior to admission patientreportsincrease in shortness of breath and feeling more weak. Associated symptoms are chest tightness, subjective fever, and malaise. He has been havingblack sticky stools persistently for the past 3 weeks. Denies nose bleed or bloody vomiting. Denies NSAID use admitted for melena and under went GI evaluation and found to have Bleeding esophageal ulcers with adherent plaqueconcerning for severe candida esophagitis as well as non-bleeding gastric and duodenal ulcers. He improved and was transfused 6 units of blood. He spiked a temperature on the night of 08/15/16 and underwent evaluation with no source of infection. He steadily  improved and PT recommended Home Health Pt. At this time he is deemed medically stable to D/C home with Home Health and will need to follow up with PCP, Hematology/Oncology, and with Gastroenterology as an outpatient.   Discharge Diagnoses:  Principal Problem:   Melena Active Problems:   MDS (myelodysplastic syndrome), low grade (HCC)   Pancytopenia (HCC)   Gastric ulcer with hemorrhage but without obstruction   Ulcer of esophagus with bleeding   Acute blood loss anemia   Protein-calorie malnutrition, severe  Fever with recent diagnosis of Pneumonia -Temperature Spiked to 101.1 overnight on 08/15/14; Acetaminophen po 650 mg po q4hprn for Mild Pain, Fever, headache -Panculture -Blood Cx x 2 showed NGTD -CXR showed Cardiac silhouette is moderately enlarged unchanged. Tortuous calcified aorta. Pulmonary vascular congestion without pleural effusion or focal consolidation. Mildly elevated LEFT hemidiaphragm with LEFT lung base strandy densities. No pneumothorax. Soft tissue planes included osseous structures are nonsuspicious -Urinalysis Negative and Urine Cx Pending -Repeat CXR this AM showed Prior CABG. Cardiomegaly with pulmonary vascular prominence and mild interstitial prominence suggesting mild CHF. Interstitial prominence has slightly increased from prior exam.Low lung volumes. -Respiratory Viral Panel on 08/11/16 Negative -No Reoccurrence -Follow up with PCP as an outpatient  Lower GIB/Melena from Esophogeal ulcers - EGD >>>noted to have esophageal ulcers with bleeding and candida, also has gastric and duodenal ulcers - cont PPI Pantoprazole 40 mg po BID x2 months then daily - Fluconazole 100 mg po Daily until biopsy is reviewed - follow for ongoing bleeding - likely passed old blood on 08/12/12; Continue to follow Hb/Hct -Continue with Soft Diet -Repeat CBC as an outpatient -Follow up with Gastroenterology in 1 month and discuss follow up EGD and possible Colonoscopy -Has no  reoccurrence of bleeding -Home Health PT/OT for weakness  Acute blood loss Anemia 2/2 to Above and MDS - transfused 6 u PRBCs; Hb/HCt now 8.0/24.5 - anemia penal consistent with AOCD - cont to follow closely and repeat CBC as an outpatient - Follow up with Dr. Alvy Bimler in Hematology as an outpatient  Hypernatremia/ hyperchloremia/ metabolic acidosis -Na+ Improved at 140 and Chloride now 118 and Co2 Stable -D/C'd D5W at 75 mL/hr now that patient is eating - hold off on Bicarb infusion and correct Cl first - Repeat CMP as an outpatient  Hypokalemia, improved -Patient's K+ 3.3 -Replete -Repeat CMP as an outpatient   AKI - Cr 2.15 on admission- improved to 1.13 -D/C'd Gentle IVF Rehydration with D5W; Patient getting 2 units of Blood -Transthoracic ECHOcardiogram showed EF of 55-60% with Grade 1 Diastolic Dysfunction and incerased RVSP consistent with Moderate pHTN -Lasix 80 mg on Hold during hospitalization but ok to restart as an outpatient -Repeat CMP as an outpatient  MDS (myelodysplastic syndrome), low grade and Pancytopenia  - per Dr Calton Dach last note in 9/17, he has stable disease and is not interested in chemo- plan was to f/u in 6 mo - NOTE: platelets lower Down from 49-> 34 likely due to consumption from GI bleed - follow and transfuse platelets if needed. -Repeat CBC in AM and closely monitor as an outpatient -Follow up with Dr. Alvy Bimler in Oncology  CAD s/p CABG -C/w Atorvastatin 20 mg po Daily and Carvedilol 25 mg po BID -Hold ASA and Losartan (AKI has improved) -Discuss with Primary about restarting ASA  Gout - C/w Allopurinol 300 mg po Daily  HTN - Restart Nifedipine, Losartan as an outpatient - Resumed Carvedilol 25 mg po BID - Continue Doxazosin 4 mg po qHS - Follow up with PCP to discuss restarting BP meds  BPH - Continue Doxazosin 4 mg po qHS  Grade 1 Diastolic Dysfunction and Moderate pHTN -Strict I's and O's and Daily weights -Restart Home  Lasix  -Reassess Volume Status as an outpatient -Follow up with PCP and Cardiology at D/C  Discharge Instructions  Discharge Instructions    Call MD for:  difficulty breathing, headache or visual disturbances    Complete by:  As directed    Call MD for:  hives    Complete by:  As directed    Call MD for:  persistant dizziness or light-headedness    Complete by:  As directed    Call MD for:  persistant nausea and vomiting    Complete by:  As directed    Call MD for:  severe uncontrolled pain    Complete by:  As directed    Call MD for:  temperature >100.4    Complete by:  As directed    Diet - low sodium heart healthy    Complete by:  As directed    Discharge instructions    Complete by:  As directed    Follow up with PCP, Hematology, and Gastroenterology as an outpatient. Take all medications as prescribed. If symptoms change or worsen please return to the ED for evaluation.   Increase activity slowly    Complete by:  As directed      Allergies as of 08/16/2016   No Known Allergies     Medication List    STOP taking these medications   aspirin 81 MG tablet   levofloxacin 500 MG tablet Commonly known as:  LEVAQUIN   oxyCODONE 5 MG immediate release tablet Commonly known as:  Oxy IR/ROXICODONE     TAKE these medications   ACETAMINOPHEN 8 HOUR 650 MG CR tablet Generic drug:  acetaminophen Take 1,300 mg by mouth every 8 (eight) hours as needed for pain.   allopurinol 300 MG tablet Commonly known as:  ZYLOPRIM Take 300 mg by mouth daily.   atorvastatin 20 MG tablet Commonly known as:  LIPITOR TAKE 1 TABLET (20 MG TOTAL) BY MOUTH DAILY.   carvedilol 25 MG tablet Commonly known as:  COREG TAKE 1 TABLET (25 MG TOTAL) BY MOUTH 2 (TWO) TIMES DAILY.   doxazosin 4 MG tablet Commonly known as:  CARDURA Take 4 mg by mouth at bedtime.   feeding supplement Liqd Take 1 Container by mouth 2 (two) times daily between meals.   feeding supplement (ENSURE ENLIVE)  Liqd Take 237 mLs by mouth 2 (two) times daily between meals.   fluconazole 100 MG tablet Commonly known as:  DIFLUCAN Take 1 tablet (100 mg total) by mouth daily. Start taking on:  08/17/2016   furosemide 80 MG tablet Commonly known as:  LASIX Take 80 mg by mouth daily.   losartan 100 MG tablet Commonly known as:  COZAAR Take 100 mg by mouth every other day.   multivitamin with minerals tablet Take 100 tablets by mouth daily.   NIFEdipine 90 MG 24 hr tablet Commonly known as:  ADALAT CC Take 90 mg by mouth daily.   pantoprazole 40 MG tablet Commonly known as:  PROTONIX Take 1 tablet (40 mg total) by mouth 2 (two) times daily.   potassium chloride SA 20 MEQ tablet Commonly known as:  K-DUR,KLOR-CON Take 20 mEq by mouth 2 (two) times daily.   PROAIR HFA 108 (90 Base) MCG/ACT inhaler Generic drug:  albuterol Inhale 2 puffs into the lungs every 6 (six) hours as needed for shortness of breath.      Follow-up Information    [facility] Follow up.   Specialty:  [place] Why:  physical therapy and aide, start of care 2/19 Contact information: [place] [address] [phone]        Melinda Crutch, MD. Call in 1 week(s).   Specialty:  Family Medicine Why:  Call to schedule appointment.  Contact information: [place] [address] [phone]        Heath Lark, MD. Call in 1 week(s).   Specialty:  Hematology and Oncology Why:  Call to schedule an appointment Contact information: [place] [phone] [id number]        Harl Bowie, MD. Call in 1 month(s).   Specialty:  Gastroenterology Why:  Call to scheudle an appointment Contact information: [place] [phone]          No Known Allergies  Consultations:  Gastroenterology  Procedures/Studies: Dg Chest 2 View  Result Date: 08/11/2016 CLINICAL DATA:   Pain all over.  Recent pneumonia. EXAM: CHEST  2 VIEW COMPARISON:  08/02/2016 FINDINGS: cardiomegaly. Prior CABG. No confluent airspace opacities or effusions. No acute bony abnormality. IMPRESSION: Cardiomegaly.  No acute findings. Electronically Signed   By: Rolm Baptise M.D.   On: 08/11/2016 09:28   Dg Chest Port 1 View  Result Date: 08/16/2016 CLINICAL DATA:  Shortness of breath. EXAM: PORTABLE CHEST 1 VIEW COMPARISON:  08/15/2016 . FINDINGS: Prior CABG. Stable cardiomegaly and mild pulmonary vascular prominence. Mild interstitial prominence. Mild CHF cannot be excluded. Low lung volumes. No prominent pleural effusion or  pneumothorax . IMPRESSION: 1. Prior CABG. Cardiomegaly with pulmonary vascular prominence and mild interstitial prominence suggesting mild CHF. Interstitial prominence has slightly increased from prior exam. 2. Low lung volumes. Electronically Signed   By: Marcello Moores  Register   On: 08/16/2016 07:31   Dg Chest Port 1 View  Result Date: 08/15/2016 CLINICAL DATA:  Fever.  History of hypertension, diabetes. EXAM: PORTABLE CHEST 1 VIEW COMPARISON:  Chest radiograph August 11, 2016 FINDINGS: Cardiac silhouette is moderately enlarged unchanged. Tortuous calcified aorta. Pulmonary vascular congestion without pleural effusion or focal consolidation. Mildly elevated LEFT hemidiaphragm with LEFT lung base strandy densities. No pneumothorax. Soft tissue planes included osseous structures are nonsuspicious. IMPRESSION: Stable cardiomegaly and pulmonary vascular congestion. LEFT lung base atelectasis. Electronically Signed   By: Elon Alas M.D.   On: 08/15/2016 06:15   ECHOCARDIOGRAM Study Conclusions  - Left ventricle: The cavity size was normal. There was moderate   focal basal hypertrophy. Systolic function was normal. The   estimated ejection fraction was in the range of 55% to 60%. Wall   motion was normal; there were no regional wall motion   abnormalities. There was an increased  relative contribution of   atrial contraction to ventricular filling. Doppler parameters are   consistent with abnormal left ventricular relaxation (grade 1   diastolic dysfunction). - Aortic valve: Moderately calcified annulus. Trileaflet; mildly   thickened, mildly calcified leaflets. There was mild   regurgitation. - Aorta: Aortic root dimension: 42.4 mm (ED). - Aortic root: The aortic root was moderately dilated. - Mitral valve: There was mild regurgitation. - Left atrium: The atrium was mildly dilated. - Pulmonary arteries: PA peak pressure: 49 mm Hg (S).  Impressions:  - The right ventricular systolic pressure was increased consistent   with moderate pulmonary hypertension.  Subjective: Seen and examined at bedside and was doing well and still felt weak but better. Had bowel movements last night that were non-bloody. No nausea or vomiting. No other concerns or complaints and ready to go home.   Discharge Exam: Vitals:   08/16/16 0525 08/16/16 1505  BP: (!) 146/55 (!) 115/57  Pulse: 70 75  Resp: 17 17  Temp: 98.8 F (37.1 C) 98.9 F (37.2 C)   Vitals:   08/15/16 1605 08/15/16 2106 08/16/16 0525 08/16/16 1505  BP: (!) 142/56 (!) 147/62 (!) 146/55 (!) 115/57  Pulse: 72 80 70 75  Resp: 18 18 17 17   Temp: 99.2 F (37.3 C) 100.1 F (37.8 C) 98.8 F (37.1 C) 98.9 F (37.2 C)  TempSrc: Oral Oral Oral Oral  SpO2: 99% 100% 100% 98%  Weight:      Height:       General: Pt is alert, awake, not in acute distress Cardiovascular: RRR, S1/S2 +, no rubs, no gallops Respiratory: CTA bilaterally, no wheezing, no rhonchi; Patient not tachypenic or using any accessory muscles to breathe.  Abdominal: Soft, NT, ND, bowel sounds + Extremities: no edema, no cyanosis  The results of significant diagnostics from this hospitalization (including imaging, microbiology, ancillary and laboratory) are listed below for reference.    Microbiology: Recent Results (from the past 240 hour(s))   Respiratory Panel by PCR     Status: None   Collection Time: 08/11/16  9:40 AM  Result Value Ref Range Status   Adenovirus NOT DETECTED NOT DETECTED Final   Coronavirus 229E NOT DETECTED NOT DETECTED Final   Coronavirus HKU1 NOT DETECTED NOT DETECTED Final   Coronavirus NL63 NOT DETECTED NOT DETECTED Final  Coronavirus OC43 NOT DETECTED NOT DETECTED Final   Metapneumovirus NOT DETECTED NOT DETECTED Final   Rhinovirus / Enterovirus NOT DETECTED NOT DETECTED Final   Influenza A NOT DETECTED NOT DETECTED Final   Influenza B NOT DETECTED NOT DETECTED Final   Parainfluenza Virus 1 NOT DETECTED NOT DETECTED Final   Parainfluenza Virus 2 NOT DETECTED NOT DETECTED Final   Parainfluenza Virus 3 NOT DETECTED NOT DETECTED Final   Parainfluenza Virus 4 NOT DETECTED NOT DETECTED Final   Respiratory Syncytial Virus NOT DETECTED NOT DETECTED Final   Bordetella pertussis NOT DETECTED NOT DETECTED Final   Chlamydophila pneumoniae NOT DETECTED NOT DETECTED Final   Mycoplasma pneumoniae NOT DETECTED NOT DETECTED Final    Comment: Performed at [facility], [place] [address] [place], [place] [address]  MRSA PCR Screening     Status: Abnormal   Collection Time: 08/11/16  1:44 PM  Result Value Ref Range Status   MRSA by PCR POSITIVE (A) NEGATIVE Final    Comment:        The GeneXpert MRSA Assay (FDA approved for NASAL specimens only), is one component of a comprehensive MRSA colonization surveillance program. It is not intended to diagnose MRSA infection nor to guide or monitor treatment for MRSA infections. RESULT CALLED TO, READ BACK BY AND VERIFIED WITH: PACANIVEO,D AT 1615 ON [id number] BY HOOKER,B   Culture, blood (routine x 2)     Status: None (Preliminary result)   Collection Time: 08/15/16  5:57 AM  Result Value Ref Range Status   Specimen Description BLOOD RIGHT HAND  Final   Special Requests BOTTLES DRAWN AEROBIC ONLY 6ML  Final   Culture   Final    NO GROWTH 1 DAY Performed at  [facility], [address], [place], [place] [address]    Report Status PENDING  Incomplete  Culture, blood (routine x 2)     Status: None (Preliminary result)   Collection Time: 08/15/16  5:57 AM  Result Value Ref Range Status   Specimen Description BLOOD RIGHT ANTECUBITAL  Final   Special Requests BOTTLES DRAWN AEROBIC AND ANAEROBIC 10ML  Final   Culture   Final    NO GROWTH 1 DAY Performed at [facility], [place] [address], [place],  [address]    Report Status PENDING  Incomplete    Labs: BNP (last 3 results)  Recent Labs  08/11/16 0958  BNP [id number]*   Basic Metabolic Panel:  Recent Labs Lab 08/12/16 0410 08/13/16 0141 08/14/16 0152 08/15/16 0205 08/16/16 0518  NA 150* 153* 148* 141 140  K 3.7 3.3* 4.1 3.7 3.3*  CL 124* 127* 125* 119* 118*  CO2 18* 19* 16* 19* 17*  GLUCOSE 121* 121* 126* 119* 122*  BUN 58* 44* 32* 27* 27*  CREATININE 1.80* 1.50* 1.35* 1.29* 1.13  CALCIUM 8.8* 8.6* 8.5* 8.1* 8.0*  MG  --   --   --  2.2 2.1  PHOS  --   --   --  2.4* 2.3*   Liver Function Tests:  Recent Labs Lab 08/11/16 1107 08/14/16 0152 08/15/16 0205 08/16/16 0518  AST 33 30 31 33  ALT 15* 16* 20 22  ALKPHOS 32* 37* 36* 44  BILITOT 0.9 0.7 1.2 1.0  PROT 6.2* 5.8* 5.6* 5.5*  ALBUMIN 2.7* 2.6* 2.3* 2.3*   No results for input(s): LIPASE, AMYLASE in the last 168 hours. No results for input(s): AMMONIA in the last 168 hours. CBC:  Recent Labs Lab  08/11/16 0940  08/13/16 1338 08/14/16 0152 08/14/16 1328 08/15/16 0205 08/16/16 0518  WBC 3.8*  < > 3.0* 2.5* 2.3* 2.3* 2.5*  NEUTROABS 3.1  --   --   --   --  1.8 2.0  HGB 4.1*  < > 7.5* 7.0* 7.1* 6.4* 8.0*  HCT 13.0*  < > 23.4* 21.7* 22.2* 19.8* 24.5*  MCV 78.8  < > 80.7 82.5 82.8 80.5 79.5  PLT 68*  < > 49* 40* 32* 34* 34*  < > = values in this interval not displayed. Cardiac Enzymes: No results for input(s): CKTOTAL, CKMB, CKMBINDEX, TROPONINI in the last 168 hours. BNP: Invalid input(s):  POCBNP CBG:  Recent Labs Lab 08/11/16 1014  GLUCAP 155*   D-Dimer No results for input(s): DDIMER in the last 72 hours. Hgb A1c No results for input(s): HGBA1C in the last 72 hours. Lipid Profile No results for input(s): CHOL, HDL, LDLCALC, TRIG, CHOLHDL, LDLDIRECT in the last 72 hours. Thyroid function studies No results for input(s): TSH, T4TOTAL, T3FREE, THYROIDAB in the last 72 hours.  Invalid input(s): FREET3 Anemia work up No results for input(s): VITAMINB12, FOLATE, FERRITIN, TIBC, IRON, RETICCTPCT in the last 72 hours. Urinalysis    Component Value Date/Time   COLORURINE YELLOW 08/15/2016 0622   APPEARANCEUR HAZY (A) 08/15/2016 0622   LABSPEC 1.010 08/15/2016 0622   PHURINE 5.0 08/15/2016 0622   GLUCOSEU NEGATIVE 08/15/2016 0622   HGBUR NEGATIVE 08/15/2016 0622   BILIRUBINUR NEGATIVE 08/15/2016 0622   KETONESUR NEGATIVE 08/15/2016 0622   PROTEINUR NEGATIVE 08/15/2016 0622   UROBILINOGEN 1.0 12/01/2008 1911   NITRITE NEGATIVE 08/15/2016 0622   LEUKOCYTESUR NEGATIVE 08/15/2016 0622   Sepsis Labs Invalid input(s): PROCALCITONIN,  WBC,  LACTICIDVEN Microbiology Recent Results (from the past 240 hour(s))  Respiratory Panel by PCR     Status: None   Collection Time: 08/11/16  9:40 AM  Result Value Ref Range Status   Adenovirus NOT DETECTED NOT DETECTED Final   Coronavirus 229E NOT DETECTED NOT DETECTED Final   Coronavirus HKU1 NOT DETECTED NOT DETECTED Final   Coronavirus NL63 NOT DETECTED NOT DETECTED Final   Coronavirus OC43 NOT DETECTED NOT DETECTED Final   Metapneumovirus NOT DETECTED NOT DETECTED Final   Rhinovirus / Enterovirus NOT DETECTED NOT DETECTED Final   Influenza A NOT DETECTED NOT DETECTED Final   Influenza B NOT DETECTED NOT DETECTED Final   Parainfluenza Virus 1 NOT DETECTED NOT DETECTED Final   Parainfluenza Virus 2 NOT DETECTED NOT DETECTED Final   Parainfluenza Virus 3 NOT DETECTED NOT DETECTED Final   Parainfluenza Virus 4 NOT DETECTED NOT  DETECTED Final   Respiratory Syncytial Virus NOT DETECTED NOT DETECTED Final   Bordetella pertussis NOT DETECTED NOT DETECTED Final   Chlamydophila pneumoniae NOT DETECTED NOT DETECTED Final   Mycoplasma pneumoniae NOT DETECTED NOT DETECTED Final    Comment: Performed at [facility], [place] [address] [place], [place] [address]  MRSA PCR Screening     Status: Abnormal   Collection Time: 08/11/16  1:44 PM  Result Value Ref Range Status   MRSA by PCR POSITIVE (A) NEGATIVE Final    Comment:        The GeneXpert MRSA Assay (FDA approved for NASAL specimens only), is one component of a comprehensive MRSA colonization surveillance program. It is not intended to diagnose MRSA infection nor to guide or monitor treatment for MRSA infections. RESULT CALLED TO, READ BACK BY AND VERIFIED WITH: PACANIVEO,D AT [place] ON [id number] BY  HOOKER,B   Culture, blood (routine x 2)     Status: None (Preliminary result)   Collection Time: 08/15/16  5:57 AM  Result Value Ref Range Status   Specimen Description BLOOD RIGHT HAND  Final   Special Requests BOTTLES DRAWN AEROBIC ONLY 6ML  Final   Culture   Final    NO GROWTH 1 DAY Performed at [facility], [address], [place], [place] [address]    Report Status PENDING  Incomplete  Culture, blood (routine x 2)     Status: None (Preliminary result)   Collection Time: 08/15/16  5:57 AM  Result Value Ref Range Status   Specimen Description BLOOD RIGHT ANTECUBITAL  Final   Special Requests BOTTLES DRAWN AEROBIC AND ANAEROBIC 10ML  Final   Culture   Final    NO GROWTH 1 DAY Performed at [facility], [place] [address], [place], [place] [address]    Report Status PENDING  Incomplete   Time coordinating discharge: Over 30 minutes  SIGNED:  Kerney Elbe, DO [facility] 08/16/2016, 3:58 PM Pager [phone]  If 7PM-7AM, please contact night-coverage [email or web address] Password [email or web address]

## 2016-08-16 NOTE — Progress Notes (Signed)
Occupational Therapy Treatment Patient Details Name: SARKIS SOLLAZZO MRN: [id number] DOB: 22-Aug-1933 Today's Date: 08/16/2016    History of present illness  [name] is a 83 y.o. male was admitted with generalized weakness and SOB. Pertinent PMHx myelodysplastic syndrome and anemia of chronic disease. Patient presented to the emergency room 08/01/16 complaining of generalized weakness and shortness of breath. Hemaglobin 4.1 at admission   OT comments  Plan is for pt to discharge today.  Pt agreeable to standing at sink for grooming. Encouraged him to continue to participate in short bursts of activity frequently at home  Follow Up Recommendations  Home health OT;Supervision/Assistance - 24 hour    Equipment Recommendations  None recommended by OT    Recommendations for Other Services      Precautions / Restrictions Precautions Precautions: Fall Restrictions Weight Bearing Restrictions: No       Mobility Bed Mobility                  Transfers   Equipment used: Rolling walker (2 wheeled)   Sit to Stand: Min guard         General transfer comment: for safety    Balance                                   ADL       Grooming: Wash/dry hands;Wash/dry face;Standing;Supervision/safety                                 General ADL Comments: pt plans home today.  He did not want to wash up.  Stood at sink for grooming.  Pt reports he has been using theraband:  reviewed exercises with him      Vision                     Perception     Praxis      Cognition   Behavior During Therapy: WFL for tasks assessed/performed Overall Cognitive Status: Within Functional Limits for tasks assessed                       Extremity/Trunk Assessment               Exercises     Shoulder Instructions       General Comments      Pertinent Vitals/ Pain       Pain Assessment: No/denies pain  Home Living                                           Prior Functioning/Environment              Frequency           Progress Toward Goals  OT Goals(current goals can now be found in the care plan section)  Progress towards OT goals: Progressing toward goals  Acute Rehab OT Goals Patient Stated Goal: to go home  Plan      Co-evaluation                 End of Session     Activity Tolerance Patient tolerated treatment well   Patient Left in chair;with call bell/phone within reach;with chair alarm set  Nurse Communication          Time: [id number] OT Time Calculation (min): 17 min  Charges: OT General Charges $OT Visit: 1 Procedure OT Treatments $Self Care/Home Management : 8-22 mins  Milinda Sweeney 08/16/2016, 11:58 AM  Lesle Chris, OTR/L [phone] 08/16/2016

## 2016-08-17 LAB — URINE CULTURE

## 2016-08-20 LAB — CULTURE, BLOOD (ROUTINE X 2)
Culture: NO GROWTH
Culture: NO GROWTH

## 2016-08-21 ENCOUNTER — Other Ambulatory Visit: Payer: Self-pay | Admitting: Family Medicine

## 2016-08-21 MED ORDER — NIFEDIPINE ER 30 MG PO TB24: 30.0000 mg | ORAL_TABLET | Freq: Every day | ORAL | 0 refills | Status: DC

## 2016-08-21 NOTE — Progress Notes (Signed)
Called by pharmacy for drug-drug interaction. Diflucan 100mg  prescribed for concern of candida esophagitis based on brushings from EGD. This may decrease the metabolism of lipitor (he's taking 20mg ) and nifedipine (taking 90mg  ER formulation). After discussion with pharmacist, will continue diflucan until follow up (should have at least 14 days) and continue lipitor since this is a lower dose. Nifedipine is a high dose and cannot be divided, so I have prescribed a 1 month supply of 30mg  dose to be taken until follow up. Patient is to follow up with PCP in next week.  Vance Gather, MD 08/21/2016 10:09 AM

## 2016-09-10 ENCOUNTER — Inpatient Hospital Stay (HOSPITAL_COMMUNITY)
Admission: EM | Admit: 2016-09-10 | Discharge: 2016-09-17 | DRG: 871 | Disposition: A | Discharge status: Hospice | Payer: Medicare Other | Attending: Internal Medicine | Admitting: Internal Medicine

## 2016-09-10 ENCOUNTER — Telehealth: Payer: Self-pay | Admitting: *Deleted

## 2016-09-10 ENCOUNTER — Emergency Department (HOSPITAL_COMMUNITY): Payer: Medicare Other

## 2016-09-10 ENCOUNTER — Encounter (HOSPITAL_COMMUNITY): Payer: Self-pay | Admitting: *Deleted

## 2016-09-10 DIAGNOSIS — D696 Thrombocytopenia, unspecified: Secondary | ICD-10-CM | POA: Diagnosis not present

## 2016-09-10 DIAGNOSIS — K254 Chronic or unspecified gastric ulcer with hemorrhage: Secondary | ICD-10-CM | POA: Diagnosis present

## 2016-09-10 DIAGNOSIS — L0292 Furuncle, unspecified: Secondary | ICD-10-CM | POA: Diagnosis present

## 2016-09-10 DIAGNOSIS — C801 Malignant (primary) neoplasm, unspecified: Secondary | ICD-10-CM | POA: Diagnosis not present

## 2016-09-10 DIAGNOSIS — E785 Hyperlipidemia, unspecified: Secondary | ICD-10-CM | POA: Diagnosis present

## 2016-09-10 DIAGNOSIS — I2510 Atherosclerotic heart disease of native coronary artery without angina pectoris: Secondary | ICD-10-CM | POA: Diagnosis present

## 2016-09-10 DIAGNOSIS — I5032 Chronic diastolic (congestive) heart failure: Secondary | ICD-10-CM | POA: Diagnosis present

## 2016-09-10 DIAGNOSIS — D638 Anemia in other chronic diseases classified elsewhere: Secondary | ICD-10-CM | POA: Diagnosis present

## 2016-09-10 DIAGNOSIS — C170 Malignant neoplasm of duodenum: Secondary | ICD-10-CM | POA: Diagnosis present

## 2016-09-10 DIAGNOSIS — D469 Myelodysplastic syndrome, unspecified: Secondary | ICD-10-CM | POA: Diagnosis present

## 2016-09-10 DIAGNOSIS — D4620 Refractory anemia with excess of blasts, unspecified: Secondary | ICD-10-CM | POA: Diagnosis not present

## 2016-09-10 DIAGNOSIS — C162 Malignant neoplasm of body of stomach: Secondary | ICD-10-CM | POA: Diagnosis present

## 2016-09-10 DIAGNOSIS — I10 Essential (primary) hypertension: Secondary | ICD-10-CM | POA: Diagnosis not present

## 2016-09-10 DIAGNOSIS — A419 Sepsis, unspecified organism: Principal | ICD-10-CM | POA: Diagnosis present

## 2016-09-10 DIAGNOSIS — E1122 Type 2 diabetes mellitus with diabetic chronic kidney disease: Secondary | ICD-10-CM | POA: Diagnosis present

## 2016-09-10 DIAGNOSIS — C160 Malignant neoplasm of cardia: Secondary | ICD-10-CM

## 2016-09-10 DIAGNOSIS — K2211 Ulcer of esophagus with bleeding: Secondary | ICD-10-CM | POA: Diagnosis present

## 2016-09-10 DIAGNOSIS — I350 Nonrheumatic aortic (valve) stenosis: Secondary | ICD-10-CM | POA: Diagnosis present

## 2016-09-10 DIAGNOSIS — N390 Urinary tract infection, site not specified: Secondary | ICD-10-CM | POA: Diagnosis present

## 2016-09-10 DIAGNOSIS — K921 Melena: Secondary | ICD-10-CM | POA: Diagnosis not present

## 2016-09-10 DIAGNOSIS — N189 Chronic kidney disease, unspecified: Secondary | ICD-10-CM | POA: Diagnosis present

## 2016-09-10 DIAGNOSIS — I959 Hypotension, unspecified: Secondary | ICD-10-CM | POA: Diagnosis present

## 2016-09-10 DIAGNOSIS — Z7189 Other specified counseling: Secondary | ICD-10-CM | POA: Diagnosis not present

## 2016-09-10 DIAGNOSIS — E1151 Type 2 diabetes mellitus with diabetic peripheral angiopathy without gangrene: Secondary | ICD-10-CM | POA: Diagnosis present

## 2016-09-10 DIAGNOSIS — M1990 Unspecified osteoarthritis, unspecified site: Secondary | ICD-10-CM | POA: Diagnosis present

## 2016-09-10 DIAGNOSIS — D649 Anemia, unspecified: Secondary | ICD-10-CM | POA: Diagnosis not present

## 2016-09-10 DIAGNOSIS — K259 Gastric ulcer, unspecified as acute or chronic, without hemorrhage or perforation: Secondary | ICD-10-CM | POA: Diagnosis not present

## 2016-09-10 DIAGNOSIS — K264 Chronic or unspecified duodenal ulcer with hemorrhage: Secondary | ICD-10-CM | POA: Diagnosis present

## 2016-09-10 DIAGNOSIS — Z87891 Personal history of nicotine dependence: Secondary | ICD-10-CM

## 2016-09-10 DIAGNOSIS — Z515 Encounter for palliative care: Secondary | ICD-10-CM | POA: Diagnosis present

## 2016-09-10 DIAGNOSIS — R6520 Severe sepsis without septic shock: Secondary | ICD-10-CM | POA: Diagnosis not present

## 2016-09-10 DIAGNOSIS — Z6823 Body mass index (BMI) 23.0-23.9, adult: Secondary | ICD-10-CM

## 2016-09-10 DIAGNOSIS — I2583 Coronary atherosclerosis due to lipid rich plaque: Secondary | ICD-10-CM | POA: Diagnosis not present

## 2016-09-10 DIAGNOSIS — I130 Hypertensive heart and chronic kidney disease with heart failure and stage 1 through stage 4 chronic kidney disease, or unspecified chronic kidney disease: Secondary | ICD-10-CM | POA: Diagnosis present

## 2016-09-10 DIAGNOSIS — N179 Acute kidney failure, unspecified: Secondary | ICD-10-CM | POA: Diagnosis present

## 2016-09-10 DIAGNOSIS — Z951 Presence of aortocoronary bypass graft: Secondary | ICD-10-CM

## 2016-09-10 DIAGNOSIS — M109 Gout, unspecified: Secondary | ICD-10-CM | POA: Diagnosis present

## 2016-09-10 DIAGNOSIS — R52 Pain, unspecified: Secondary | ICD-10-CM

## 2016-09-10 DIAGNOSIS — E876 Hypokalemia: Secondary | ICD-10-CM | POA: Diagnosis present

## 2016-09-10 DIAGNOSIS — Z66 Do not resuscitate: Secondary | ICD-10-CM | POA: Diagnosis present

## 2016-09-10 DIAGNOSIS — E43 Unspecified severe protein-calorie malnutrition: Secondary | ICD-10-CM | POA: Diagnosis present

## 2016-09-10 DIAGNOSIS — R531 Weakness: Secondary | ICD-10-CM | POA: Diagnosis present

## 2016-09-10 DIAGNOSIS — C169 Malignant neoplasm of stomach, unspecified: Secondary | ICD-10-CM | POA: Diagnosis not present

## 2016-09-10 DIAGNOSIS — D61818 Other pancytopenia: Secondary | ICD-10-CM | POA: Diagnosis present

## 2016-09-10 DIAGNOSIS — D62 Acute posthemorrhagic anemia: Secondary | ICD-10-CM | POA: Diagnosis present

## 2016-09-10 DIAGNOSIS — Z79899 Other long term (current) drug therapy: Secondary | ICD-10-CM

## 2016-09-10 DIAGNOSIS — D46Z Other myelodysplastic syndromes: Secondary | ICD-10-CM | POA: Diagnosis present

## 2016-09-10 DIAGNOSIS — K269 Duodenal ulcer, unspecified as acute or chronic, without hemorrhage or perforation: Secondary | ICD-10-CM | POA: Diagnosis not present

## 2016-09-10 DIAGNOSIS — I455 Other specified heart block: Secondary | ICD-10-CM | POA: Diagnosis present

## 2016-09-10 DIAGNOSIS — E860 Dehydration: Secondary | ICD-10-CM | POA: Diagnosis present

## 2016-09-10 DIAGNOSIS — D500 Iron deficiency anemia secondary to blood loss (chronic): Secondary | ICD-10-CM | POA: Diagnosis present

## 2016-09-10 DIAGNOSIS — B9620 Unspecified Escherichia coli [E. coli] as the cause of diseases classified elsewhere: Secondary | ICD-10-CM | POA: Diagnosis present

## 2016-09-10 DIAGNOSIS — N19 Unspecified kidney failure: Secondary | ICD-10-CM | POA: Diagnosis not present

## 2016-09-10 HISTORY — DX: Malignant (primary) neoplasm, unspecified: C80.1

## 2016-09-10 LAB — COMPREHENSIVE METABOLIC PANEL
ALT: 20 U/L (ref 17–63)
ANION GAP: 9 (ref 5–15)
AST: 34 U/L (ref 15–41)
Albumin: 1.9 g/dL — ABNORMAL LOW (ref 3.5–5.0)
Alkaline Phosphatase: 46 U/L (ref 38–126)
BUN: 70 mg/dL — ABNORMAL HIGH (ref 6–20)
CHLORIDE: 113 mmol/L — AB (ref 101–111)
CO2: 17 mmol/L — AB (ref 22–32)
Calcium: 8.2 mg/dL — ABNORMAL LOW (ref 8.9–10.3)
Creatinine, Ser: 2.49 mg/dL — ABNORMAL HIGH (ref 0.61–1.24)
GFR, EST AFRICAN AMERICAN: 27 mL/min — AB (ref >60)
GFR, EST NON AFRICAN AMERICAN: 23 mL/min — AB (ref >60)
Glucose, Bld: 129 mg/dL — ABNORMAL HIGH (ref 65–99)
Potassium: 3.7 mmol/L (ref 3.5–5.1)
SODIUM: 139 mmol/L (ref 135–145)
Total Bilirubin: 0.6 mg/dL (ref 0.3–1.2)
Total Protein: 5.6 g/dL — ABNORMAL LOW (ref 6.5–8.1)

## 2016-09-10 LAB — I-STAT CG4 LACTIC ACID, ED
Lactic Acid, Venous: 4.83 mmol/L (ref 0.5–1.9)
Lactic Acid, Venous: 3.27 mmol/L (ref 0.5–1.9)

## 2016-09-10 LAB — CBC WITH DIFFERENTIAL/PLATELET
BASOS PCT: 0 %
Band Neutrophils: 0 %
Basophils Absolute: 0 10*3/uL (ref 0.0–0.1)
Blasts: 0 %
EOS PCT: 9 %
Eosinophils Absolute: 0.2 10*3/uL (ref 0.0–0.7)
HEMATOCRIT: 12.2 % — AB (ref 39.0–52.0)
Hemoglobin: 3.9 g/dL — CL (ref 13.0–17.0)
LYMPHS ABS: 0.2 10*3/uL — AB (ref 0.7–4.0)
LYMPHS PCT: 8 %
MCH: 26.4 pg (ref 26.0–34.0)
MCHC: 32 g/dL (ref 30.0–36.0)
MCV: 82.4 fL (ref 78.0–100.0)
MONO ABS: 0 10*3/uL — AB (ref 0.1–1.0)
MYELOCYTES: 0 %
Metamyelocytes Relative: 0 %
Monocytes Relative: 0 %
NEUTROS PCT: 83 %
NRBC: 6 /100{WBCs} — AB
Neutro Abs: 1.6 10*3/uL — ABNORMAL LOW (ref 1.7–7.7)
OTHER: 0 %
PLATELETS: 36 10*3/uL — AB (ref 150–400)
PROMYELOCYTES ABS: 0 %
RBC: 1.48 MIL/uL — AB (ref 4.22–5.81)
RDW: 22.6 % — ABNORMAL HIGH (ref 11.5–15.5)
WBC MORPHOLOGY: INCREASED
WBC: 2 10*3/uL — AB (ref 4.0–10.5)

## 2016-09-10 LAB — PREPARE RBC (CROSSMATCH)

## 2016-09-10 LAB — INFLUENZA PANEL BY PCR (TYPE A & B)
INFLBPCR: NEGATIVE
Influenza A By PCR: NEGATIVE

## 2016-09-10 LAB — POC OCCULT BLOOD, ED: Fecal Occult Bld: POSITIVE — AB

## 2016-09-10 LAB — HEMOGLOBIN AND HEMATOCRIT, BLOOD
HEMATOCRIT: 10.5 % — AB (ref 39.0–52.0)
Hemoglobin: 3.4 g/dL — CL (ref 13.0–17.0)

## 2016-09-10 MED ORDER — SODIUM CHLORIDE 0.9 % IV SOLN
Freq: Once | INTRAVENOUS | Status: AC
  Administered 2016-09-10: via INTRAVENOUS

## 2016-09-10 MED ORDER — HYDROCODONE-ACETAMINOPHEN 5-325 MG PO TABS
1.0000 | ORAL_TABLET | ORAL | Status: DC | PRN
Start: 2016-09-10 — End: 2016-09-17

## 2016-09-10 MED ORDER — ACETAMINOPHEN 650 MG RE SUPP: 650.0000 mg | Freq: Four times a day (QID) | RECTAL | Status: DC | PRN

## 2016-09-10 MED ORDER — SODIUM CHLORIDE 0.9 % IV BOLUS (SEPSIS)
1000.0000 mL | Freq: Once | INTRAVENOUS | Status: AC
  Administered 2016-09-10: 1000 mL via INTRAVENOUS

## 2016-09-10 MED ORDER — ONDANSETRON HCL 4 MG PO TABS: 4.0000 mg | ORAL_TABLET | Freq: Four times a day (QID) | ORAL | Status: DC | PRN

## 2016-09-10 MED ORDER — ADULT MULTIVITAMIN W/MINERALS CH
1.0000 | ORAL_TABLET | Freq: Every day | ORAL | Status: DC
  Administered 2016-09-11 – 2016-09-17 (×6): 1 via ORAL
  Filled 2016-09-10 (×6): qty 1

## 2016-09-10 MED ORDER — ACETAMINOPHEN 325 MG PO TABS
650.0000 mg | ORAL_TABLET | Freq: Four times a day (QID) | ORAL | Status: DC | PRN
  Administered 2016-09-16: 650 mg via ORAL
  Filled 2016-09-10: qty 2

## 2016-09-10 MED ORDER — SODIUM CHLORIDE 0.9 % IV BOLUS (SEPSIS): 500.0000 mL | Freq: Once | INTRAVENOUS | Status: DC

## 2016-09-10 MED ORDER — ALBUTEROL SULFATE (2.5 MG/3ML) 0.083% IN NEBU: 2.5000 mg | INHALATION_SOLUTION | RESPIRATORY_TRACT | Status: DC | PRN

## 2016-09-10 MED ORDER — PANTOPRAZOLE SODIUM 40 MG IV SOLR
40.0000 mg | Freq: Two times a day (BID) | INTRAVENOUS | Status: DC
  Administered 2016-09-11 – 2016-09-17 (×13): 40 mg via INTRAVENOUS
  Filled 2016-09-10 (×13): qty 40

## 2016-09-10 MED ORDER — ONDANSETRON HCL 4 MG/2ML IJ SOLN: 4.0000 mg | Freq: Four times a day (QID) | INTRAMUSCULAR | Status: DC | PRN

## 2016-09-10 MED ORDER — SODIUM CHLORIDE 0.9 % IV BOLUS (SEPSIS)
500.0000 mL | Freq: Once | INTRAVENOUS | Status: AC
  Administered 2016-09-10: 500 mL via INTRAVENOUS

## 2016-09-10 MED ORDER — SODIUM CHLORIDE 0.9% FLUSH
3.0000 mL | Freq: Two times a day (BID) | INTRAVENOUS | Status: DC
  Administered 2016-09-11 – 2016-09-17 (×13): 3 mL via INTRAVENOUS

## 2016-09-10 MED ORDER — ALBUTEROL SULFATE HFA 108 (90 BASE) MCG/ACT IN AERS: 1.0000 | INHALATION_SPRAY | RESPIRATORY_TRACT | Status: DC | PRN

## 2016-09-10 MED ORDER — VANCOMYCIN HCL IN DEXTROSE 1-5 GM/200ML-% IV SOLN
1000.0000 mg | Freq: Once | INTRAVENOUS | Status: AC
  Administered 2016-09-10: 1000 mg via INTRAVENOUS
  Filled 2016-09-10: qty 200

## 2016-09-10 MED ORDER — BOOST / RESOURCE BREEZE PO LIQD
1.0000 | Freq: Two times a day (BID) | ORAL | Status: DC
  Administered 2016-09-11: 1 via ORAL
  Filled 2016-09-10 (×3): qty 1

## 2016-09-10 MED ORDER — FLUCONAZOLE 100 MG PO TABS
100.0000 mg | ORAL_TABLET | Freq: Every day | ORAL | Status: DC
  Administered 2016-09-11 – 2016-09-17 (×6): 100 mg via ORAL
  Filled 2016-09-10 (×7): qty 1

## 2016-09-10 MED ORDER — PIPERACILLIN-TAZOBACTAM 3.375 G IVPB
3.3750 g | Freq: Three times a day (TID) | INTRAVENOUS | Status: DC
  Administered 2016-09-11 – 2016-09-16 (×17): 3.375 g via INTRAVENOUS
  Filled 2016-09-10 (×20): qty 50

## 2016-09-10 MED ORDER — ALLOPURINOL 300 MG PO TABS
300.0000 mg | ORAL_TABLET | Freq: Every day | ORAL | Status: DC
Start: 2016-09-11 — End: 2016-09-17
  Administered 2016-09-11 – 2016-09-17 (×6): 300 mg via ORAL
  Filled 2016-09-10: qty 1
  Filled 2016-09-10: qty 3
  Filled 2016-09-10: qty 1
  Filled 2016-09-10 (×3): qty 3
  Filled 2016-09-10: qty 1

## 2016-09-10 MED ORDER — PIPERACILLIN-TAZOBACTAM 3.375 G IVPB 30 MIN
3.3750 g | Freq: Once | INTRAVENOUS | Status: AC
  Administered 2016-09-10: 3.375 g via INTRAVENOUS
  Filled 2016-09-10: qty 50

## 2016-09-10 MED ORDER — ATORVASTATIN CALCIUM 10 MG PO TABS
20.0000 mg | ORAL_TABLET | Freq: Every day | ORAL | Status: DC
  Administered 2016-09-11 – 2016-09-15 (×5): 20 mg via ORAL
  Filled 2016-09-10 (×5): qty 2

## 2016-09-10 MED ORDER — LIDOCAINE-EPINEPHRINE (PF) 2 %-1:200000 IJ SOLN
20.0000 mL | Freq: Once | INTRAMUSCULAR | Status: AC
  Administered 2016-09-10: 20 mL
  Filled 2016-09-10: qty 20

## 2016-09-10 MED ORDER — SODIUM CHLORIDE 0.9 % IV SOLN: Freq: Once | INTRAVENOUS | Status: DC

## 2016-09-10 MED ORDER — VANCOMYCIN HCL IN DEXTROSE 1-5 GM/200ML-% IV SOLN
1000.0000 mg | INTRAVENOUS | Status: DC
  Administered 2016-09-11 – 2016-09-12 (×2): 1000 mg via INTRAVENOUS
  Filled 2016-09-10 (×2): qty 200

## 2016-09-10 NOTE — Progress Notes (Signed)
Pharmacy Antibiotic Note  [name] is a 83 y.o. male admitted on 09/10/2016 with sepsis.  Pharmacy has been consulted for vancomycin and zosyn dosing. Patient presents with fever and fatigue.  Currently receiving levofloxacin as outpatient for PNA.  Recent admission for anemia (no abx given during that encounter)  Today, 09/10/2016   Renal - AKI  WBC - pending  LA elevated  Plan:  Vancomycin 1gm IV x 1 then 1gm IV q24h (give 1st dose ~12h after ED dose)  Zosyn 3.375gm IV x 1 over 27min then zosyn 3.375gm IV q8h over 4h infusion as long as CrCl remains > 20 ml/min  Monitor renal fx, Check daily SCr   Check vancomycin trough as indicated  Await culture results  Height: 6\' 1"  (185.4 cm) Weight: 181 lb (82.1 kg) IBW/kg (Calculated) : 79.9  Temp (24hrs), Avg:99.2 F (37.3 C), Min:98.9 F (37.2 C), Max:99.4 F (37.4 C)   Recent Labs Lab 09/10/16 1602  LATICACIDVEN 4.83*    CrCl cannot be calculated (Patient's most recent lab result is older than the maximum 21 days allowed.).    No Known Allergies  Antimicrobials this admission: 3/13 >> vancomycin >> 3/13 >> zosyn >>  Dose adjustments this admission:  Microbiology results: 3/13 Flu PCR: 3/13 BCx:   Thank you for allowing pharmacy to be a part of this patient's care.  Doreene Eland, PharmD, BCPS.   Pager: [phone] 09/10/2016 4:17 PM

## 2016-09-10 NOTE — ED Notes (Signed)
Pt given urinal and made aware of need for urine specimen 

## 2016-09-10 NOTE — ED Notes (Signed)
Bed: WA02 Expected date:  Expected time:  Means of arrival:  Comments: EMS-weakness 

## 2016-09-10 NOTE — ED Triage Notes (Signed)
Pt reports weakness for 3 weeks. Pt presents febrile temp 102F per [facility]. Pt received 1000 mg of tylenol PO.

## 2016-09-10 NOTE — ED Notes (Signed)
Pt is aware urine is needed 

## 2016-09-10 NOTE — ED Notes (Signed)
No urine output or BM noted in bedpan

## 2016-09-10 NOTE — H&P (Signed)
History and Physical    [name] MBW:[id number] DOB: 1933-07-15 DOA: 09/10/2016  PCP: Melinda Crutch, MD   Patient coming from: Home  Chief Complaint: Gen weakness, fever  HPI: [name] is a 83 y.o. male with medical history significant for hypertension, coronary artery disease status post CABG, chronic diastolic CHF, MDS, and recent admission for acute blood loss anemia attributed to bleeding esophageal ulcers, now presenting to the emergency department with 3 weeks of progressive generalized weakness and one day of fevers. Patient was discharged from the hospital on 08/16/2016 after management for acute blood loss anemia attributed to bleeding esophageal ulcers. He reports being in much improved condition upon discharge and felt well at home until approximately 3 weeks ago when he noted the insidious development of generalized weakness. He reports continued melanotic stools since the hospital discharge, but denies abdominal pain or nausea. There's been no vomiting. He reports continued adherence with his twice daily PPI and has continued to avoid NSAIDs. He denies any significant cough or dyspnea and denies chest pain or palpitations. He denies any significant rash, but notes a "boil" overlying the anterior right knee that was incised and drained by his PCP one week ago and treated with antibiotics. The site appears to be healing appropriately per patient report and there is been no significant erythema or drainage. He reports the development of subjective fevers and chills sometime in the past 24 hours. He denies dysuria or flank pain. He has been taking Levaquin daily since the boil was lanced. EMS was called out to the patient's house this evening and he was reported to have a fever about 102 F. He was treated with 1 g of Tylenol en route.  ED Course: Upon arrival to the ED, patient is found to be afebrile, saturating adequately on room air, hypotensive with pressures in the 90/50 range, and  stable heart rate and respirations. EKG features a sinus rhythm with first-degree AV nodal block, anterolateral T-wave inversions, and QTc of 560 ms. Chest x-ray is notable for pulmonary vascular congestion without edema or consolidation. Chemistry panel reveals a BUN of 70 and serum creatinine of 2.49, up from 1.13 a month ago. CBC features of pancytopenia with WBC 2000, hemoglobin 3.9, down from 8.0 one month ago, and thrombocytopenia to 36,000. There are increased band forms on the CBC. Lactic acid is elevated to 4.83 and fecal occult blood testing is positive. Blood cultures were obtained, flu PCR was sent, 30 cc/kg NS bolus was given, 2 units of packed red blood cells were ordered for immediate transfusion, and the patient was started on empiric vancomycin and Zosyn.  Blood pressure remains soft in the ED with MAP persisting around 60. Gastroenterology and PCCM were consulted by the ED physician and will see the patient in consultation. Patient will be admitted to the stepdown unit for ongoing evaluation and management of acute blood loss anemia and sepsis with unknown source.   Review of Systems:  All other systems reviewed and apart from HPI, are negative.  Past Medical History:  Diagnosis Date  . Anemia, chronic disease    Followed by Dr. Lamonte Sakai  . Back pain 01/25/2014  . Coronary artery disease    CABG x4  -- with Est. EF of 50-60%  --  07/06/2007  . Diabetes mellitus   . GI bleed   . Gout   . Hyperlipidemia   . Hypertension    since his Mid -- 20's  . MDS (myelodysplastic syndrome), low grade (  HCC)   . Mild aortic stenosis   . Osteoarthritis   . Renal insufficiency   . Thrombocytopenia ([place])     Past Surgical History:  Procedure Laterality Date  . CARDIAC CATHETERIZATION  06/29/2007    Left heart catheterization with selective coronary angiography, left ventricular angiography -- Probable normal left ventricular function and dilated aortic root -- Extensive and heavy calcification of  coronary arteries --  Ludwig Lean. Doreatha Lew, M.D.       . CORONARY ARTERY BYPASS GRAFT  07/06/2007    x4 using a left internal mammary  artery graft to the left anterior descending coronary artery with a saphenous vein graft to the diagonal branch to the left anterior descending artery, a saphenous vein graft to the obtuse marginal branch of the left circumflex coronary artery, and a saphenous vein graft to  the posterior descending artery of the right coronary artery -  Gilford Raid, M.D  . ESOPHAGOGASTRODUODENOSCOPY (EGD) WITH PROPOFOL N/A 08/12/2016   Procedure: ESOPHAGOGASTRODUODENOSCOPY (EGD) WITH PROPOFOL;  Surgeon: Mauri Pole, MD;  Location: [facility];  Service: Endoscopy;  Laterality: N/A;  . TOTAL KNEE ARTHROPLASTY  11/25/2008   Right knee - Severe degenerative arthritis with a varus deformity of the right knee --  Jori Moll A. [name], M.D.      reports that he quit smoking about 36 years ago. He has never used smokeless tobacco. He reports that he does not drink alcohol or use drugs.  No Known Allergies  Family History  Problem Relation Age of Onset  . Cancer Daughter     colon ca     Prior to Admission medications   Medication Sig Start Date End Date Taking? Authorizing Provider  acetaminophen (ACETAMINOPHEN 8 HOUR) 650 MG CR tablet Take 1,300 mg by mouth every 8 (eight) hours as needed for pain.   Yes Historical Provider, MD  albuterol (PROVENTIL HFA;VENTOLIN HFA) 108 (90 Base) MCG/ACT inhaler Inhale 1-2 puffs into the lungs every 6 (six) hours as needed for wheezing or shortness of breath.   Yes Historical Provider, MD  allopurinol (ZYLOPRIM) 300 MG tablet Take 300 mg by mouth daily.     Yes Historical Provider, MD  atorvastatin (LIPITOR) 20 MG tablet TAKE 1 TABLET (20 MG TOTAL) BY MOUTH DAILY. 04/06/12  Yes Thayer Headings, MD  carvedilol (COREG) 25 MG tablet TAKE 1 TABLET (25 MG TOTAL) BY MOUTH 2 (TWO) TIMES DAILY. 12/26/14  Yes Thayer Headings, MD  doxazosin (CARDURA) 4  MG tablet Take 4 mg by mouth at bedtime.     Yes Historical Provider, MD  feeding supplement (BOOST / RESOURCE BREEZE) LIQD Take 1 Container by mouth 2 (two) times daily between meals. 08/16/16  Yes [place], DO  fluconazole (DIFLUCAN) 100 MG tablet Take 1 tablet (100 mg total) by mouth daily. 08/17/16  Yes [place], DO  furosemide (LASIX) 80 MG tablet Take 80 mg by mouth daily.    Yes Historical Provider, MD  levofloxacin (LEVAQUIN) 750 MG tablet Take 750 mg by mouth daily. 09/03/16 09/13/16 Yes Historical Provider, MD  Multiple Vitamins-Minerals (MULTIVITAMIN WITH MINERALS) tablet Take 1 tablet by mouth daily.    Yes Historical Provider, MD  NIFEdipine (PROCARDIA-XL/ADALAT CC) 30 MG 24 hr tablet Take 1 tablet (30 mg total) by mouth daily. 08/21/16 09/20/16 Yes Patrecia Pour, MD  pantoprazole (PROTONIX) 40 MG tablet Take 1 tablet (40 mg total) by mouth 2 (two) times daily. 08/16/16  Yes [place], DO  potassium  chloride SA (K-DUR,KLOR-CON) 20 MEQ tablet Take 20 mEq by mouth 2 (two) times daily.     Yes Historical Provider, MD    Physical Exam: Vitals:   09/10/16 1645 09/10/16 1715 09/10/16 1730 09/10/16 1800  BP: (!) 89/49 (!) 88/46 (!) 98/46 (!) 97/43  Pulse: 74 72 72 74  Resp:  22 19 24   Temp:      TempSrc:      SpO2: 100% 100% 100% 99%  Weight:      Height:          Constitutional: No acute distress, calm, pale, no diaphoresis.  Eyes: PERTLA, lids and conjunctivae normal ENMT: Mucous membranes are moist. Posterior pharynx clear of any exudate or lesions.   Neck: normal, supple, no masses, no thyromegaly Respiratory: clear to auscultation bilaterally, no wheezing, no crackles. Normal respiratory effort.   Cardiovascular: S1 & S2 heard, regular rate and rhythm. 1+ pretibial edema b/l. No significant JVD. Abdomen: No distension, no tenderness, no masses palpated. Bowel sounds normal.  Musculoskeletal: no clubbing / cyanosis. No joint deformity upper and lower  extremities. Normal muscle tone.  Skin: Warm, dry, well-perfused. Pale. Poor turgor. Fluctuant nodule overlies anterior right knee without erythema, tenderness, heat, or drainage.  Neurologic: CN 2-12 grossly intact. Sensation intact, DTR normal. Strength 5/5 in all 4 limbs.  Psychiatric: Normal judgment and insight. Alert and oriented x 3. Normal mood and affect.     Labs on Admission: I have personally reviewed following labs and imaging studies  CBC:  Recent Labs Lab 09/10/16 1600  WBC 2.0*  NEUTROABS 1.6*  HGB 3.9*  HCT 12.2*  MCV 82.4  PLT 36*   Basic Metabolic Panel:  Recent Labs Lab 09/10/16 1600  NA 139  K 3.7  CL 113*  CO2 17*  GLUCOSE 129*  BUN 70*  CREATININE 2.49*  CALCIUM 8.2*   GFR: Estimated Creatinine Clearance: 26.7 mL/min (by C-G formula based on SCr of 2.49 mg/dL (H)). Liver Function Tests:  Recent Labs Lab 09/10/16 1600  AST 34  ALT 20  ALKPHOS 46  BILITOT 0.6  PROT 5.6*  ALBUMIN 1.9*   No results for input(s): LIPASE, AMYLASE in the last 168 hours. No results for input(s): AMMONIA in the last 168 hours. Coagulation Profile: No results for input(s): INR, PROTIME in the last 168 hours. Cardiac Enzymes: No results for input(s): CKTOTAL, CKMB, CKMBINDEX, TROPONINI in the last 168 hours. BNP (last 3 results) No results for input(s): PROBNP in the last 8760 hours. HbA1C: No results for input(s): HGBA1C in the last 72 hours. CBG: No results for input(s): GLUCAP in the last 168 hours. Lipid Profile: No results for input(s): CHOL, HDL, LDLCALC, TRIG, CHOLHDL, LDLDIRECT in the last 72 hours. Thyroid Function Tests: No results for input(s): TSH, T4TOTAL, FREET4, T3FREE, THYROIDAB in the last 72 hours. Anemia Panel: No results for input(s): VITAMINB12, FOLATE, FERRITIN, TIBC, IRON, RETICCTPCT in the last 72 hours. Urine analysis:    Component Value Date/Time   COLORURINE YELLOW 08/15/2016 0622   APPEARANCEUR HAZY (A) 08/15/2016 0622    LABSPEC 1.010 08/15/2016 0622   PHURINE 5.0 08/15/2016 0622   GLUCOSEU NEGATIVE 08/15/2016 0622   HGBUR NEGATIVE 08/15/2016 0622   BILIRUBINUR NEGATIVE 08/15/2016 0622   KETONESUR NEGATIVE 08/15/2016 0622   PROTEINUR NEGATIVE 08/15/2016 0622   UROBILINOGEN 1.0 12/01/2008 1911   NITRITE NEGATIVE 08/15/2016 0622   LEUKOCYTESUR NEGATIVE 08/15/2016 0622   Sepsis Labs: @LABRCNTIP (procalcitonin:4,lacticidven:4) )No results found for this or any previous visit (from the  past 240 hour(s)).   Radiological Exams on Admission: Dg Chest 2 View  Result Date: 09/10/2016 CLINICAL DATA:  Fever and progressive weakness EXAM: CHEST  2 VIEW COMPARISON:  August 16, 2016. FINDINGS: There is no edema or consolidation. There is cardiomegaly with mild pulmonary venous hypertension. No adenopathy. There is atherosclerotic calcification in the aorta. There is mild degenerative change in the thoracic spine. Patient is status post coronary artery bypass grafting. IMPRESSION: Pulmonary vascular congestion without edema or consolidation. There is aortic atherosclerosis. Electronically Signed   By: Lowella Grip III M.D.   On: 09/10/2016 15:44    EKG: Independently reviewed. Sinus rhythm, first degree AV block, anterolateral T-wave flattening/inversions, QTc 560 ms   Assessment/Plan  1. Upper GIB, symptomatic anemia, pancytopenia   - Pt presents with melena, FOBT+ stool, Hgb 3.9 (8.0 on 08/16/16), BUN 70 - EGD (08/12/16) with bleeding esophageal ulcers and findings concerning for severe candidal esophagitis; he has been taking Diflucan and Protonix BID for past month at home - He has underlying MDS followed by hematology; possible sepsis being worked-up as below and may be contributing  - Type and screen has been performed and 2 units pRBCs ordered for immediate transfusion; will transfuse platelets as well given thrombocytopenia to 36k with bleeding - Convert PPI to Protonix 40 mg IV q12h, check post-transfusion  CBC  - GI is consulting and much appreciated, will follow-up recommendations    2. Sepsis, source not yet identified  - Presents with fever 102 F (with EMS, treated with APAP PTA), leukopenia to 2.9k, elevated lactate, and hypotension  - Has been on Levaquin since 09/03/16 when he had a boil on right knee lanced; CXR clear, UA remains pending (no urinary sxs); possibly a viral syndrome and influenza PCR pending  - Blood cultures taken in ED and a 30 cc/kg NS bolus given - Given his critical illness, plan to continue empiric vancomycin and Zosyn for now - Trend lactate and clinical course   3. Acute kidney injury  - SCr is 2.49 on admission, up from 1.13 one month prior  - Likely a prerenal azotemia given the GI bleeding with soft BP's and clinical dehydration; ATN possible  - He was fluid-resuscitated with 30 cc/kg NS in ED and blood products are ordered for immediate transfusion  - Check urine studies, avoid nephrotoxins, hold diuretic and antihypertensives, renally-dose medications, and repeat chem panel in am    4. Chronic diastolic CHF  - Pt is dry on admission in setting of GI blood-loss and sepsis  - TTE (08/13/16) with EF 75-64%, grade 1 diastolic dysfunction, moderate focal basal hypertrophy, mild AR, mild MR, and mild LAE  - He is managed with Coreg and Lasix 80 mg qD at home; these are held on admission in light of hypotension and AKI  - Follow I/O's and daily wts, resume medications as appropriate    5. Hypertension  - BP has remained soft since presentation  - Managed at home with nifedipine, Coreg, and Cardura at home; these are held on admission in light of soft BP  - Monitor and resume treatment as needed    6. CAD - Status-post CABG  - No anginal complaints on admission  - Coreg held in light of soft BP and will be resumed as patient's condition allows     DVT prophylaxis: SCD's  Code Status: Full  Family Communication: Daughters updated at bedside with patient's  permission Disposition Plan: Admit to SDU Consults called: Gastroenterology, PCCM Admission status:  Inpatient    Vianne Bulls, MD [facility] Pager [phone]  If 7PM-7AM, please contact night-coverage [email or web address] Password [facility]  09/10/2016, 7:11 PM

## 2016-09-10 NOTE — ED Provider Notes (Signed)
[facility] DEPT Provider Note   CSN: [id number] Arrival date & time: 09/10/16  1422     History   Chief Complaint Chief Complaint  Patient presents with  . Weakness    HPI [name] is a 83 y.o. male.  [name] is a 83 y.o. Male who presents to the ED with complaint of fever starting today. Patient was found to have a fever of 102 at home today, by his rehab specialist. Patient did not know he had a fever. Daughters at bedside report he has been more fatigued and coughing more over the past 3 weeks. Patient was admitted three weeks ago with pneumonia. They report his cough improved and then returned. He also reported the patient had an abscess drained on his right knee about 1 week.. He has been on Levaquin over the past 7 days. He has 2 doses of Levaquin left. Patient only complains of mild pain to his right knee with movement. No further discharge from his knee. He reports feeling lightheaded with position change. He was found to have a temperature of 102 by EMS and received a gram of Tylenol enroute. He did receive his flu shot this year. He denies chest pain, shortness of breath, abdominal pain, nausea, vomiting, diarrhea, rashes, or syncope.   The history is provided by the patient, a relative and medical records. No language interpreter was used.  Weakness  Primary symptoms include no dizziness. Pertinent negatives include no shortness of breath, no chest pain, no vomiting and no headaches.    Past Medical History:  Diagnosis Date  . Anemia, chronic disease    Followed by Dr. Lamonte Sakai  . Back pain 01/25/2014  . Coronary artery disease    CABG x4  -- with Est. EF of 50-60%  --  07/06/2007  . Diabetes mellitus   . GI bleed   . Gout   . Hyperlipidemia   . Hypertension    since his Mid -- 20's  . MDS (myelodysplastic syndrome), low grade ([place])   . Mild aortic stenosis   . Osteoarthritis   . Renal insufficiency   . Thrombocytopenia [facility])     Patient Active Problem  List   Diagnosis Date Noted  . Symptomatic anemia 09/10/2016  . Severe sepsis ([place]) 09/10/2016  . Chronic diastolic CHF (congestive heart failure) ([place]) 09/10/2016  . AKI (acute kidney injury) ([place]) 09/10/2016  . Protein-calorie malnutrition, severe 08/13/2016  . Melena 08/12/2016  . Acute blood loss anemia 08/12/2016  . Gastric ulcer with hemorrhage but without obstruction   . Ulcer of esophagus with bleeding   . Left knee pain 12/27/2014  . Midline low back pain without sciatica 03/29/2014  . Back pain 01/25/2014  . Leukopenia 01/25/2014  . Hyperlipidemia 08/09/2011  . Osteoarthritis   . MDS (myelodysplastic syndrome), low grade ([place])   . Pancytopenia ([place])   . Anemia in neoplastic disease   . CAD (coronary artery disease) 01/08/2011  . Mild aortic stenosis 01/08/2011  . HTN (hypertension) 01/08/2011    Past Surgical History:  Procedure Laterality Date  . CARDIAC CATHETERIZATION  06/29/2007    Left heart catheterization with selective coronary angiography, left ventricular angiography -- Probable normal left ventricular function and dilated aortic root -- Extensive and heavy calcification of coronary arteries --  Ludwig Lean. Doreatha Lew, M.D.       . CORONARY ARTERY BYPASS GRAFT  07/06/2007    x4 using a left internal mammary  artery graft to the left anterior descending coronary  artery with a saphenous vein graft to the diagonal branch to the left anterior descending artery, a saphenous vein graft to the obtuse marginal branch of the left circumflex coronary artery, and a saphenous vein graft to  the posterior descending artery of the right coronary artery -  Gilford Raid, M.D  . ESOPHAGOGASTRODUODENOSCOPY (EGD) WITH PROPOFOL N/A 08/12/2016   Procedure: ESOPHAGOGASTRODUODENOSCOPY (EGD) WITH PROPOFOL;  Surgeon: Mauri Pole, MD;  Location: [facility] ENDOSCOPY;  Service: Endoscopy;  Laterality: N/A;  . TOTAL KNEE ARTHROPLASTY  11/25/2008   Right knee - Severe degenerative arthritis with a  varus deformity of the right knee --  Jori Moll A. Gladstone Lighter, M.D.        Home Medications    Prior to Admission medications   Medication Sig Start Date End Date Taking? Authorizing Provider  acetaminophen (ACETAMINOPHEN 8 HOUR) 650 MG CR tablet Take 1,300 mg by mouth every 8 (eight) hours as needed for pain.   Yes Historical Provider, MD  albuterol (PROVENTIL HFA;VENTOLIN HFA) 108 (90 Base) MCG/ACT inhaler Inhale 1-2 puffs into the lungs every 6 (six) hours as needed for wheezing or shortness of breath.   Yes Historical Provider, MD  allopurinol (ZYLOPRIM) 300 MG tablet Take 300 mg by mouth daily.     Yes Historical Provider, MD  atorvastatin (LIPITOR) 20 MG tablet TAKE 1 TABLET (20 MG TOTAL) BY MOUTH DAILY. 04/06/12  Yes Thayer Headings, MD  carvedilol (COREG) 25 MG tablet TAKE 1 TABLET (25 MG TOTAL) BY MOUTH 2 (TWO) TIMES DAILY. 12/26/14  Yes Thayer Headings, MD  doxazosin (CARDURA) 4 MG tablet Take 4 mg by mouth at bedtime.     Yes Historical Provider, MD  feeding supplement (BOOST / RESOURCE BREEZE) LIQD Take 1 Container by mouth 2 (two) times daily between meals. 08/16/16  Yes [place], DO  fluconazole (DIFLUCAN) 100 MG tablet Take 1 tablet (100 mg total) by mouth daily. 08/17/16  Yes [place], DO  furosemide (LASIX) 80 MG tablet Take 80 mg by mouth daily.    Yes Historical Provider, MD  levofloxacin (LEVAQUIN) 750 MG tablet Take 750 mg by mouth daily. 09/03/16 09/13/16 Yes Historical Provider, MD  Multiple Vitamins-Minerals (MULTIVITAMIN WITH MINERALS) tablet Take 1 tablet by mouth daily.    Yes Historical Provider, MD  NIFEdipine (PROCARDIA-XL/ADALAT CC) 30 MG 24 hr tablet Take 1 tablet (30 mg total) by mouth daily. 08/21/16 09/20/16 Yes Patrecia Pour, MD  pantoprazole (PROTONIX) 40 MG tablet Take 1 tablet (40 mg total) by mouth 2 (two) times daily. 08/16/16  Yes [place], DO  potassium chloride SA (K-DUR,KLOR-CON) 20 MEQ tablet Take 20 mEq by mouth 2 (two) times daily.      Yes Historical Provider, MD    Family History Family History  Problem Relation Age of Onset  . Cancer Daughter     colon ca    Social History Social History  Substance Use Topics  . Smoking status: Former Smoker    Quit date: 07/01/1980  . Smokeless tobacco: Never Used  . Alcohol use No     Allergies   Patient has no known allergies.   Review of Systems Review of Systems  Constitutional: Positive for chills, fatigue and fever.  HENT: Negative for congestion and sore throat.   Eyes: Negative for visual disturbance.  Respiratory: Positive for cough. Negative for shortness of breath and wheezing.   Cardiovascular: Negative for chest pain and palpitations.  Gastrointestinal: Positive for blood in stool. Negative  for abdominal pain, diarrhea, nausea and vomiting.  Genitourinary: Negative for difficulty urinating, dysuria, frequency, hematuria and urgency.  Musculoskeletal: Negative for back pain, myalgias and neck pain.  Skin: Negative for rash.  Neurological: Positive for weakness and light-headedness. Negative for dizziness, syncope, numbness and headaches.     Physical Exam Updated Vital Signs BP (!) 115/54   Pulse 72   Temp 97.6 F (36.4 C) (Oral)   Resp 23   Ht 6\' 1"  (1.854 m)   Wt 82.1 kg   SpO2 100%   BMI 23.88 kg/m   Physical Exam  Constitutional: He is oriented to person, place, and time. He appears well-developed and well-nourished. No distress.  Nontoxic appearing.  HENT:  Head: Normocephalic and atraumatic.  Right Ear: External ear normal.  Left Ear: External ear normal.  Mouth/Throat: Oropharynx is clear and moist.  Eyes: Conjunctivae and EOM are normal. Pupils are equal, round, and reactive to light. Right eye exhibits no discharge. Left eye exhibits no discharge.  Neck: Normal range of motion. Neck supple. No JVD present. No tracheal deviation present.  Cardiovascular: Normal rate, regular rhythm and intact distal pulses.  Exam reveals no gallop  and no friction rub.   Murmur heard. Pulmonary/Chest: Effort normal and breath sounds normal. No stridor. No respiratory distress. He has no wheezes. He has no rales.  Lungs clear to auscultation bilaterally.  Abdominal: Soft. There is no tenderness. There is no guarding.  Abdomen is soft and nontender to palpation.  Genitourinary: Rectal exam shows guaiac positive stool.  Genitourinary Comments: Rectal exam performed with male RN chaperone. Dark black stool on rectal exam. Guaiac positive.  Musculoskeletal: Normal range of motion. He exhibits no edema, tenderness or deformity.  Evidence of recent incision and drainage noted to his right medial aspect of his knee. No discharge noted. No surrounding erythema, edema or warmth. Good range of motion of his right knee without any difficulty.  Lymphadenopathy:    He has no cervical adenopathy.  Neurological: He is alert and oriented to person, place, and time. No sensory deficit. He exhibits normal muscle tone. Coordination normal.  Skin: Skin is warm and dry. Capillary refill takes less than 2 seconds. No rash noted. He is not diaphoretic. No erythema. No pallor.  Psychiatric: He has a normal mood and affect. His behavior is normal.  Nursing note and vitals reviewed.    ED Treatments / Results  Labs (all labs ordered are listed, but only abnormal results are displayed) Labs Reviewed  COMPREHENSIVE METABOLIC PANEL - Abnormal; Notable for the following:       Result Value   Chloride 113 (*)    CO2 17 (*)    Glucose, Bld 129 (*)    BUN 70 (*)    Creatinine, Ser 2.49 (*)    Calcium 8.2 (*)    Total Protein 5.6 (*)    Albumin 1.9 (*)    GFR calc non Af Amer 23 (*)    GFR calc Af Amer 27 (*)    All other components within normal limits  CBC WITH DIFFERENTIAL/PLATELET - Abnormal; Notable for the following:    WBC 2.0 (*)    RBC 1.48 (*)    Hemoglobin 3.9 (*)    HCT 12.2 (*)    RDW 22.6 (*)    Platelets 36 (*)    nRBC 6 (*)    Neutro  Abs 1.6 (*)    Lymphs Abs 0.2 (*)    Monocytes Absolute 0.0 (*)  All other components within normal limits  HEMOGLOBIN AND HEMATOCRIT, BLOOD - Abnormal; Notable for the following:    Hemoglobin 3.4 (*)    HCT 10.5 (*)    All other components within normal limits  I-STAT CG4 LACTIC ACID, ED - Abnormal; Notable for the following:    Lactic Acid, Venous 4.83 (*)    All other components within normal limits  POC OCCULT BLOOD, ED - Abnormal; Notable for the following:    Fecal Occult Bld POSITIVE (*)    All other components within normal limits  I-STAT CG4 LACTIC ACID, ED - Abnormal; Notable for the following:    Lactic Acid, Venous 3.27 (*)    All other components within normal limits  CULTURE, BLOOD (ROUTINE X 2)  CULTURE, BLOOD (ROUTINE X 2)  URINE CULTURE  INFLUENZA PANEL BY PCR (TYPE A & B)  URINALYSIS, ROUTINE W REFLEX MICROSCOPIC  BASIC METABOLIC PANEL  LACTIC ACID, PLASMA  LACTIC ACID, PLASMA  SODIUM, URINE, RANDOM  CREATININE, URINE, RANDOM  UREA NITROGEN, URINE  TYPE AND SCREEN  PREPARE RBC (CROSSMATCH)  PREPARE PLATELET PHERESIS    EKG  EKG Interpretation None       Radiology Dg Chest 2 View  Result Date: 09/10/2016 CLINICAL DATA:  Fever and progressive weakness EXAM: CHEST  2 VIEW COMPARISON:  August 16, 2016. FINDINGS: There is no edema or consolidation. There is cardiomegaly with mild pulmonary venous hypertension. No adenopathy. There is atherosclerotic calcification in the aorta. There is mild degenerative change in the thoracic spine. Patient is status post coronary artery bypass grafting. IMPRESSION: Pulmonary vascular congestion without edema or consolidation. There is aortic atherosclerosis. Electronically Signed   By: Lowella Grip III M.D.   On: 09/10/2016 15:44    Procedures Procedures (including critical care time)  CRITICAL CARE Performed by: Hanley Hays   Total critical care time: 50 minutes  Critical care time was  exclusive of separately billable procedures and treating other patients.  Critical care was necessary to treat or prevent imminent or life-threatening deterioration.  Critical care was time spent personally by me on the following activities: development of treatment plan with patient and/or surrogate as well as nursing, discussions with consultants, evaluation of patient's response to treatment, examination of patient, obtaining history from patient or surrogate, ordering and performing treatments and interventions, ordering and review of laboratory studies, ordering and review of radiographic studies, pulse oximetry and re-evaluation of patient's condition.   Medications Ordered in ED Medications  piperacillin-tazobactam (ZOSYN) IVPB 3.375 g (not administered)  vancomycin (VANCOCIN) IVPB 1000 mg/200 mL premix (not administered)  feeding supplement (BOOST / RESOURCE BREEZE) liquid 1 Container (not administered)  fluconazole (DIFLUCAN) tablet 100 mg (not administered)  atorvastatin (LIPITOR) tablet 20 mg (not administered)  multivitamin with minerals tablet 1 tablet (not administered)  allopurinol (ZYLOPRIM) tablet 300 mg (not administered)  sodium chloride flush (NS) 0.9 % injection 3 mL (not administered)  acetaminophen (TYLENOL) tablet 650 mg (not administered)    Or  acetaminophen (TYLENOL) suppository 650 mg (not administered)  HYDROcodone-acetaminophen (NORCO/VICODIN) 5-325 MG per tablet 1-2 tablet (not administered)  ondansetron (ZOFRAN) tablet 4 mg (not administered)    Or  ondansetron (ZOFRAN) injection 4 mg (not administered)  pantoprazole (PROTONIX) injection 40 mg (not administered)  albuterol (PROVENTIL) (2.5 MG/3ML) 0.083% nebulizer solution 2.5 mg (not administered)  sodium chloride 0.9 % bolus 1,000 mL (0 mLs Intravenous Stopped 09/10/16 1804)  sodium chloride 0.9 % bolus 1,000 mL (0 mLs Intravenous Stopped 09/10/16 1731)  sodium chloride 0.9 % bolus 500 mL (0 mLs Intravenous  Stopped 09/10/16 1931)  piperacillin-tazobactam (ZOSYN) IVPB 3.375 g (0 g Intravenous Stopped 09/10/16 1652)  vancomycin (VANCOCIN) IVPB 1000 mg/200 mL premix (0 mg Intravenous Stopped 09/10/16 1904)  lidocaine-EPINEPHrine (XYLOCAINE W/EPI) 2 %-1:200000 (PF) injection 20 mL (20 mLs Infiltration Given by Other 09/10/16 1735)  0.9 %  sodium chloride infusion ( Intravenous New Bag/Given 09/10/16 2342)     Initial Impression / Assessment and Plan / ED Course  I have reviewed the triage vital signs and the nursing notes.  Pertinent labs & imaging results that were available during my care of the patient were reviewed by me and considered in my medical decision making (see chart for details).    This  is a 83 y.o. Male who presents to the ED with complaint of fever starting today. Patient was found to have a fever of 102 at home today, by his rehab specialist. Patient did not know he had a fever. Daughters at bedside report he has been more fatigued and coughing more over the past 3 weeks. Patient was admitted three weeks ago with pneumonia. They report his cough improved and then returned. He also reported the patient had an abscess drained on his right knee about 1 week.. He has been on Levaquin over the past 7 days. He has 2 doses of Levaquin left. Patient only complains of mild pain to his right knee with movement. No further discharge from his knee. He reports feeling lightheaded with position change. He was found to have a temperature of 102 by EMS and received a gram of Tylenol enroute.  Initially, the patient denies any rectal bleeding. Later, he does tell me he's had dark black stools per rectum for several weeks. He reports is been ongoing since he was admitted to the hospital 3 weeks ago. Hit a recent admission for pneumonia and anemia where he received blood products and an EGD which showed a bleeding ulcer. Patient was seen by a gastroduodenal is Dr. Silverio Decamp for this EGD.  On my initial exam  patient is hypotensive with a pressure of 34/19 systolic. He is not tachycardic. Temperature was 102 per EMS. Patient is provided with a gram of Tylenol in route by EMS. I activated code sepsis after my initial evaluation. Patient is mentating well. His oxygen saturation is 100% on room air. Lungs clear to auscultation bilaterally. Is evidence of a recent incision and drainage to his right knee which appears clean and without infection. No discharge noted. A joint effusion noted. His good range of motion of his right knee without difficulty. No evidence of septic joint.   Hemoglobin returned at 3.9 g/dL. Hematocrit is 12.2. Patient is guaiac positive with dark black stool on exam. Will start blood products for the patient. CMP is normal for an acute kidney injury with a creatinine of 2.49 which is about 1.8 from his baseline.  Initial lactic acid is 4.83. Orders placed for severe sepsis and 30 mL per KG fluid bolus ordered. Chest x-ray is unremarkable and antibiotics for unknown source initiated. Patient tolerating fluid bolus well and has no signs of fluid overload at this time. He has good capillary refill and blood pressure is improving. Will admit to medicine. Patient and family agree with plan for admission.   I consulted with Dr. Myna Hidalgo from [facility] who accepted the patient for admission. He would like me to consult with GI and critical care for the patient.   I spoke  with critical care medicine who reports they can consult on the patient and will see him if his lactic acid is not improving.   I consulted with Dr. Loletha Carrow from GI who will have his service consult on the patient as well.   This patient was discussed with and evaluated by Dr. Johnney Killian who agrees with assessment and plan.   Final Clinical Impressions(s) / ED Diagnoses   Final diagnoses:  Melena  Sepsis, due to unspecified organism ([place])  Symptomatic anemia  AKI (acute kidney injury) [facility])    New Prescriptions New  Prescriptions   No medications on file     Waynetta Pean, PA-C 09/11/16 0128    Charlesetta Shanks, MD 09/11/16 1115

## 2016-09-10 NOTE — Telephone Encounter (Signed)
"  My dad has an appointment tomorrow at 10:00 am.  He says someone called him telling him the appointment has been cancelled"   Appointment is not at [facility] but [facility].  Appointment information and location information provided wit this call.  Visit provider was changed was reason patient received call.  No further questions.

## 2016-09-10 NOTE — ED Notes (Signed)
Pt reminded of need for urine specimen. Pt currently on bedpan.

## 2016-09-11 ENCOUNTER — Ambulatory Visit: Payer: Medicare Other | Admitting: Gastroenterology

## 2016-09-11 ENCOUNTER — Ambulatory Visit: Payer: Medicare Other | Admitting: Nurse Practitioner

## 2016-09-11 ENCOUNTER — Inpatient Hospital Stay (HOSPITAL_COMMUNITY): Payer: Medicare Other

## 2016-09-11 DIAGNOSIS — D62 Acute posthemorrhagic anemia: Secondary | ICD-10-CM

## 2016-09-11 DIAGNOSIS — A419 Sepsis, unspecified organism: Principal | ICD-10-CM

## 2016-09-11 DIAGNOSIS — K921 Melena: Secondary | ICD-10-CM

## 2016-09-11 LAB — URINALYSIS, ROUTINE W REFLEX MICROSCOPIC
Bilirubin Urine: NEGATIVE
GLUCOSE, UA: NEGATIVE mg/dL
Hgb urine dipstick: NEGATIVE
Ketones, ur: NEGATIVE mg/dL
NITRITE: NEGATIVE
PH: 5 (ref 5.0–8.0)
PROTEIN: NEGATIVE mg/dL
Specific Gravity, Urine: 1.009 (ref 1.005–1.030)

## 2016-09-11 LAB — CBC
HCT: 20.4 % — ABNORMAL LOW (ref 39.0–52.0)
Hemoglobin: 6.9 g/dL — CL (ref 13.0–17.0)
MCH: 28.3 pg (ref 26.0–34.0)
MCHC: 33.8 g/dL (ref 30.0–36.0)
MCV: 83.6 fL (ref 78.0–100.0)
PLATELETS: 39 10*3/uL — AB (ref 150–400)
RBC: 2.44 MIL/uL — ABNORMAL LOW (ref 4.22–5.81)
RDW: 17.9 % — AB (ref 11.5–15.5)
WBC: 1.9 10*3/uL — ABNORMAL LOW (ref 4.0–10.5)

## 2016-09-11 LAB — CBC WITH DIFFERENTIAL/PLATELET
BASOS ABS: 0 10*3/uL (ref 0.0–0.1)
BASOS PCT: 0 %
EOS PCT: 9 %
Eosinophils Absolute: 0.2 10*3/uL (ref 0.0–0.7)
HCT: 16.6 % — ABNORMAL LOW (ref 39.0–52.0)
Hemoglobin: 5.6 g/dL — CL (ref 13.0–17.0)
Lymphocytes Relative: 7 %
Lymphs Abs: 0.1 10*3/uL — ABNORMAL LOW (ref 0.7–4.0)
MCH: 28.6 pg (ref 26.0–34.0)
MCHC: 33.7 g/dL (ref 30.0–36.0)
MCV: 84.7 fL (ref 78.0–100.0)
Monocytes Absolute: 0 10*3/uL — ABNORMAL LOW (ref 0.1–1.0)
Monocytes Relative: 1 %
Neutro Abs: 1.4 10*3/uL — ABNORMAL LOW (ref 1.7–7.7)
Neutrophils Relative %: 83 %
PLATELETS: 30 10*3/uL — AB (ref 150–400)
RBC: 1.96 MIL/uL — AB (ref 4.22–5.81)
RDW: 19.6 % — AB (ref 11.5–15.5)
WBC: 1.7 10*3/uL — AB (ref 4.0–10.5)

## 2016-09-11 LAB — LACTIC ACID, PLASMA: Lactic Acid, Venous: 2 mmol/L (ref 0.5–1.9)

## 2016-09-11 LAB — BASIC METABOLIC PANEL
Anion gap: 9 (ref 5–15)
BUN: 61 mg/dL — AB (ref 6–20)
CO2: 17 mmol/L — ABNORMAL LOW (ref 22–32)
Calcium: 8 mg/dL — ABNORMAL LOW (ref 8.9–10.3)
Chloride: 113 mmol/L — ABNORMAL HIGH (ref 101–111)
Creatinine, Ser: 2.17 mg/dL — ABNORMAL HIGH (ref 0.61–1.24)
GFR, EST AFRICAN AMERICAN: 31 mL/min — AB (ref >60)
GFR, EST NON AFRICAN AMERICAN: 27 mL/min — AB (ref >60)
Glucose, Bld: 111 mg/dL — ABNORMAL HIGH (ref 65–99)
POTASSIUM: 3.3 mmol/L — AB (ref 3.5–5.1)
SODIUM: 139 mmol/L (ref 135–145)

## 2016-09-11 LAB — HEMOGLOBIN AND HEMATOCRIT, BLOOD
HEMATOCRIT: 21.4 % — AB (ref 39.0–52.0)
Hemoglobin: 7.2 g/dL — ABNORMAL LOW (ref 13.0–17.0)

## 2016-09-11 LAB — CBG MONITORING, ED: GLUCOSE-CAPILLARY: 117 mg/dL — AB (ref 65–99)

## 2016-09-11 LAB — PREPARE RBC (CROSSMATCH)

## 2016-09-11 LAB — CREATININE, URINE, RANDOM: CREATININE, URINE: 39.94 mg/dL

## 2016-09-11 LAB — MRSA PCR SCREENING: MRSA BY PCR: POSITIVE — AB

## 2016-09-11 LAB — SODIUM, URINE, RANDOM: Sodium, Ur: 58 mmol/L

## 2016-09-11 MED ORDER — FUROSEMIDE 10 MG/ML IJ SOLN
20.0000 mg | Freq: Once | INTRAMUSCULAR | Status: AC
  Administered 2016-09-11: 20 mg via INTRAVENOUS
  Filled 2016-09-11: qty 4

## 2016-09-11 MED ORDER — SODIUM CHLORIDE 0.9 % IV SOLN
Freq: Once | INTRAVENOUS | Status: AC
  Administered 2016-09-11: 22:00:00 via INTRAVENOUS

## 2016-09-11 MED ORDER — SODIUM CHLORIDE 0.9 % IV SOLN
Freq: Once | INTRAVENOUS | Status: AC
  Administered 2016-09-11: 10:00:00 via INTRAVENOUS

## 2016-09-11 NOTE — Progress Notes (Signed)
CRITICAL VALUE ALERT  Critical value received:  Hgb 6.9  Date of notification:  09/11/2016  Time of notification:  2100  Critical value read back: yes  Nurse who received alert:  Marcello Fennel  MD notified (1st page):  Gaetano Net  Time of first page:  09/11/2016 2105

## 2016-09-11 NOTE — Progress Notes (Addendum)
[facility] Progress Note Patient Name: [name] DOB: 1934-05-12 MRN: [id number]   Date of Service  09/11/2016  HPI/Events of Note  Severe anemia and thrombocytopenia. Remains normotensive, normal sat, no tachycardia.  Received 2u PRBC, repeat CBC pending.  Repeat lactate 4.8>3.27>2.  eICU Interventions  Await repeat CBC and transfuse as necessary.         Laverle Hobby 09/11/2016, 3:58 AM

## 2016-09-11 NOTE — Anesthesia Preprocedure Evaluation (Addendum)
Anesthesia Evaluation  Patient identified by MRN, date of birth, ID band Patient awake    Reviewed: Allergy & Precautions, NPO status , Patient's Chart, lab work & pertinent test results  Airway Mallampati: II  TM Distance: >3 FB Neck ROM: Full    Dental  (+) Dental Advisory Given   Pulmonary neg pulmonary ROS, former smoker,    Pulmonary exam normal        Cardiovascular hypertension, + CAD and + CABG  Normal cardiovascular exam+ Valvular Problems/Murmurs AS      Neuro/Psych negative neurological ROS  negative psych ROS   GI/Hepatic Neg liver ROS, PUD,   Endo/Other  diabetes  Renal/GU Renal InsufficiencyRenal disease  negative genitourinary   Musculoskeletal negative musculoskeletal ROS (+)   Abdominal   Peds negative pediatric ROS (+)  Hematology  (+) anemia , thrombocytopenia   Anesthesia Other Findings   Reproductive/Obstetrics negative OB ROS                            Anesthesia Physical  Anesthesia Plan  ASA: III  Anesthesia Plan: MAC   Post-op Pain Management:    Induction: Intravenous  Airway Management Planned: Nasal Cannula  Additional Equipment:   Intra-op Plan:   Post-operative Plan:   Informed Consent: I have reviewed the patients History and Physical, chart, labs and discussed the procedure including the risks, benefits and alternatives for the proposed anesthesia with the patient or authorized representative who has indicated his/her understanding and acceptance.   Dental advisory given  Plan Discussed with: Anesthesiologist and CRNA  Anesthesia Plan Comments:        Anesthesia Quick Evaluation

## 2016-09-11 NOTE — Progress Notes (Signed)
Case reviewed Clinically now stable w/ lactate clearing > 50% On room air. No pressors. BP 120 Looks like UGIB w/ plan for EGD 3/15 and GI already on board.  Looks like all issues are being addressed appropriately by primary team.  We will be available as needed but no role for [place] at this time unless he were to become unstable. We will be available PRN  Erick Colace ACNP-BC [place] Pager # [phone] OR # [phone] if no answer

## 2016-09-11 NOTE — ED Notes (Signed)
Patient able to urinate 200 ml.

## 2016-09-11 NOTE — Consult Note (Signed)
Consultation  Referring Provider:  Dr. Tyrell Antonio    Primary Care Physician:  Melinda Crutch, MD Primary Gastroenterologist:   Dr. Henrene Pastor    Reason for Consultation:  Acute blood loss anemia, Melena            HPI:   [name] is an 83 y.o. male with a past medical history of anemia of chronic disease, coronary artery disease last echo 08/13/16 55 to 60%, diabetes, hyperlipidemia, hypertension, MDS and renal insufficiency as well as others listed below, who presented to the ER on 09/11/16 for generalized weakness and fever.   Per chart review, patient does have a recent history of admission for acute blood loss anemia related to bleeding esophageal ulcers, discharged on 08/16/16. See EGD below.    Today, the patient tells me he was apparently doing well until about 3 weeks ago when he started to develop generalized weakness, noting that he was just "more tired than usual'. He also tells me that he had continued with melanotic stool 2-3 times a week since time of discharge, but thought that this was "to be expected".  Patient denies abdominal pain and tells me has has avoided NSAID's and has been taking his Pantoprazole 40mg  BID since time of discharge.  He also did finish his Diflucan 2 weeks after d/c as prescribed.  Associated symptoms include some SOB and dizziness with change in position over the past week.   Recent medical history shows that patient had a "boil" overlying the anterior right knee that was incised and drained by his PCP 1 week ago and treated with Levaquin.    Patient denies fever, chills, weight loss or change in appetite.  ED Course: Upon arrival to the ED, patient is found to be afebrile, saturating adequately on room air, hypotensive with pressures in the 90/50 range, and stable heart rate and respirations. EKG features a sinus rhythm with first-degree AV nodal block, anterolateral T-wave inversions, and QTc of 560 ms. Chest x-ray is notable for pulmonary vascular congestion  without edema or consolidation. Chemistry panel reveals a BUN of 70 and serum creatinine of 2.49, up from 1.13 a month ago. CBC features of pancytopenia with WBC 2000, hemoglobin 3.9, down from 8.0 one month ago, and thrombocytopenia to 36,000. There are increased band forms on the CBC. Lactic acid is elevated to 4.83 and fecal occult blood testing is positive. Blood cultures were obtained, flu PCR was sent, 30 cc/kg NS bolus was given, 2 units of packed red blood cells were ordered for immediate transfusion, and the patient was started on empiric vancomycin and Zosyn.  Blood pressure remains soft in the ED with MAP persisting around 60. Gastroenterology and PCCM were consulted by the ED physician and will see the patient in consultation. Patient will be admitted to the stepdown unit for ongoing evaluation and management of acute blood loss anemia and sepsis with unknown source.   Previous GI history: 08/12/16-EGD, Dr. Silverio Decamp: Done for recent gastrointestinal bleeding suspected upper GI bleedat time of hospital admission; impression: Bleeding esophageal ulcers with adherent plaques concerning for severe Candida esophagitis, nonbleeding gastric ulcer with pigmented material, one non-bleeding duodenal ulcer with no stigmata of bleeding and normal second portion of the duodenum;recommendations: Patient started on fluconazole 100 mg by mouth daily for 2 weeks then told to return to GI office in 2-3 weeks with Dr. Henrene Pastor, repeat EGD is recommended in 2-3 minutes check healing 11/13/05-colonoscopy, Dr. Henrene Pastor: Done for surveillance of adenomatous polyps;impression:diminutive polyp in  the cecum, multiple polyps in the ascending colon to sigmoid colon ranging from 2-5 mm in size, total of 4 polyps, diverticulosis in the ascending colon to sigmoid colonwith marked changes with deep foldss well as internal hemorrhoids  Past Medical History:  Diagnosis Date  . Anemia, chronic disease    Followed by Dr. Lamonte Sakai  . Back pain  01/25/2014  . Coronary artery disease    CABG x4  -- with Est. EF of 50-60%  --  07/06/2007  . Diabetes mellitus   . GI bleed   . Gout   . Hyperlipidemia   . Hypertension    since his Mid -- 20's  . MDS (myelodysplastic syndrome), low grade ([place])   . Mild aortic stenosis   . Osteoarthritis   . Renal insufficiency   . Thrombocytopenia ([place])     Past Surgical History:  Procedure Laterality Date  . CARDIAC CATHETERIZATION  06/29/2007    Left heart catheterization with selective coronary angiography, left ventricular angiography -- Probable normal left ventricular function and dilated aortic root -- Extensive and heavy calcification of coronary arteries --  Ludwig Lean. Doreatha Lew, M.D.       . CORONARY ARTERY BYPASS GRAFT  07/06/2007    x4 using a left internal mammary  artery graft to the left anterior descending coronary artery with a saphenous vein graft to the diagonal branch to the left anterior descending artery, a saphenous vein graft to the obtuse marginal branch of the left circumflex coronary artery, and a saphenous vein graft to  the posterior descending artery of the right coronary artery -  Gilford Raid, M.D  . ESOPHAGOGASTRODUODENOSCOPY (EGD) WITH PROPOFOL N/A 08/12/2016   Procedure: ESOPHAGOGASTRODUODENOSCOPY (EGD) WITH PROPOFOL;  Surgeon: Mauri Pole, MD;  Location: [facility];  Service: Endoscopy;  Laterality: N/A;  . TOTAL KNEE ARTHROPLASTY  11/25/2008   Right knee - Severe degenerative arthritis with a varus deformity of the right knee --  Jori Moll A. Gladstone Lighter, M.D.     Family History  Problem Relation Age of Onset  . Cancer Daughter     colon ca    Social History  Substance Use Topics  . Smoking status: Former Smoker    Quit date: 07/01/1980  . Smokeless tobacco: Never Used  . Alcohol use No    Prior to Admission medications   Medication Sig Start Date End Date Taking? Authorizing Provider  acetaminophen (ACETAMINOPHEN 8 HOUR) 650 MG CR tablet Take 1,300 mg  by mouth every 8 (eight) hours as needed for pain.   Yes Historical Provider, MD  albuterol (PROVENTIL HFA;VENTOLIN HFA) 108 (90 Base) MCG/ACT inhaler Inhale 1-2 puffs into the lungs every 6 (six) hours as needed for wheezing or shortness of breath.   Yes Historical Provider, MD  allopurinol (ZYLOPRIM) 300 MG tablet Take 300 mg by mouth daily.     Yes Historical Provider, MD  atorvastatin (LIPITOR) 20 MG tablet TAKE 1 TABLET (20 MG TOTAL) BY MOUTH DAILY. 04/06/12  Yes Thayer Headings, MD  carvedilol (COREG) 25 MG tablet TAKE 1 TABLET (25 MG TOTAL) BY MOUTH 2 (TWO) TIMES DAILY. 12/26/14  Yes Thayer Headings, MD  doxazosin (CARDURA) 4 MG tablet Take 4 mg by mouth at bedtime.     Yes Historical Provider, MD  feeding supplement (BOOST / RESOURCE BREEZE) LIQD Take 1 Container by mouth 2 (two) times daily between meals. 08/16/16  Yes McMullin, DO  fluconazole (DIFLUCAN) 100 MG tablet Take 1 tablet (100 mg total) by  mouth daily. 08/17/16  Yes Aldrich, DO  furosemide (LASIX) 80 MG tablet Take 80 mg by mouth daily.    Yes Historical Provider, MD  levofloxacin (LEVAQUIN) 750 MG tablet Take 750 mg by mouth daily. 09/03/16 09/13/16 Yes Historical Provider, MD  Multiple Vitamins-Minerals (MULTIVITAMIN WITH MINERALS) tablet Take 1 tablet by mouth daily.    Yes Historical Provider, MD  NIFEdipine (PROCARDIA-XL/ADALAT CC) 30 MG 24 hr tablet Take 1 tablet (30 mg total) by mouth daily. 08/21/16 09/20/16 Yes Patrecia Pour, MD  pantoprazole (PROTONIX) 40 MG tablet Take 1 tablet (40 mg total) by mouth 2 (two) times daily. 08/16/16  Yes [place], DO  potassium chloride SA (K-DUR,KLOR-CON) 20 MEQ tablet Take 20 mEq by mouth 2 (two) times daily.     Yes Historical Provider, MD    Current Facility-Administered Medications  Medication Dose Route Frequency Provider Last Rate Last Dose  . acetaminophen (TYLENOL) tablet 650 mg  650 mg Oral Q6H PRN Vianne Bulls, MD       Or  . acetaminophen (TYLENOL)  suppository 650 mg  650 mg Rectal Q6H PRN Ilene Qua Opyd, MD      . albuterol (PROVENTIL) (2.5 MG/3ML) 0.083% nebulizer solution 2.5 mg  2.5 mg Nebulization Q4H PRN Vianne Bulls, MD      . allopurinol (ZYLOPRIM) tablet 300 mg  300 mg Oral Daily Ilene Qua Opyd, MD      . atorvastatin (LIPITOR) tablet 20 mg  20 mg Oral q1800 Timothy S Opyd, MD      . feeding supplement (BOOST / RESOURCE BREEZE) liquid 1 Container  1 Container Oral BID BM Timothy S Opyd, MD      . fluconazole (DIFLUCAN) tablet 100 mg  100 mg Oral Daily Vianne Bulls, MD      . HYDROcodone-acetaminophen (NORCO/VICODIN) 5-325 MG per tablet 1-2 tablet  1-2 tablet Oral Q4H PRN Vianne Bulls, MD      . multivitamin with minerals tablet 1 tablet  1 tablet Oral Daily Timothy S Opyd, MD      . ondansetron (ZOFRAN) tablet 4 mg  4 mg Oral Q6H PRN Vianne Bulls, MD       Or  . ondansetron (ZOFRAN) injection 4 mg  4 mg Intravenous Q6H PRN Ilene Qua Opyd, MD      . pantoprazole (PROTONIX) injection 40 mg  40 mg Intravenous Q12H Vianne Bulls, MD   40 mg at 09/11/16 0119  . piperacillin-tazobactam (ZOSYN) IVPB 3.375 g  3.375 g Intravenous Q8H Berton Mount, RPH 12.5 mL/hr at 09/11/16 0611 3.375 g at 09/11/16 0611  . sodium chloride flush (NS) 0.9 % injection 3 mL  3 mL Intravenous Q12H Ilene Qua Opyd, MD   3 mL at 09/11/16 0127  . vancomycin (VANCOCIN) IVPB 1000 mg/200 mL premix  1,000 mg Intravenous Q24H Berton Mount, RPH 200 mL/hr at 09/11/16 0759 1,000 mg at 09/11/16 0759   Current Outpatient Prescriptions  Medication Sig Dispense Refill  . acetaminophen (ACETAMINOPHEN 8 HOUR) 650 MG CR tablet Take 1,300 mg by mouth every 8 (eight) hours as needed for pain.    Marland Kitchen albuterol (PROVENTIL HFA;VENTOLIN HFA) 108 (90 Base) MCG/ACT inhaler Inhale 1-2 puffs into the lungs every 6 (six) hours as needed for wheezing or shortness of breath.    . allopurinol (ZYLOPRIM) 300 MG tablet Take 300 mg by mouth daily.      Marland Kitchen atorvastatin (LIPITOR) 20  MG tablet TAKE 1  TABLET (20 MG TOTAL) BY MOUTH DAILY. 30 tablet 6  . carvedilol (COREG) 25 MG tablet TAKE 1 TABLET (25 MG TOTAL) BY MOUTH 2 (TWO) TIMES DAILY. 60 tablet 6  . doxazosin (CARDURA) 4 MG tablet Take 4 mg by mouth at bedtime.      . feeding supplement (BOOST / RESOURCE BREEZE) LIQD Take 1 Container by mouth 2 (two) times daily between meals. 4 Container 0  . fluconazole (DIFLUCAN) 100 MG tablet Take 1 tablet (100 mg total) by mouth daily. 30 tablet 0  . furosemide (LASIX) 80 MG tablet Take 80 mg by mouth daily.     Marland Kitchen levofloxacin (LEVAQUIN) 750 MG tablet Take 750 mg by mouth daily.    . Multiple Vitamins-Minerals (MULTIVITAMIN WITH MINERALS) tablet Take 1 tablet by mouth daily.     Marland Kitchen NIFEdipine (PROCARDIA-XL/ADALAT CC) 30 MG 24 hr tablet Take 1 tablet (30 mg total) by mouth daily. 30 tablet 0  . pantoprazole (PROTONIX) 40 MG tablet Take 1 tablet (40 mg total) by mouth 2 (two) times daily. 60 tablet 0  . potassium chloride SA (K-DUR,KLOR-CON) 20 MEQ tablet Take 20 mEq by mouth 2 (two) times daily.        Allergies as of 09/10/2016  . (No Known Allergies)     Review of Systems:    Constitutional: Positive for weakness Skin: No rash  Cardiovascular: No chest pain Respiratory: Positive for mild SOB Gastrointestinal: See HPI and otherwise negative Genitourinary: No dysuria or change in urinary frequency Neurological: No headache Musculoskeletal: No new muscle or joint pain Hematologic: No bruising Psychiatric: No history of depression or anxiety   Physical Exam:  Vital signs in last 24 hours: Temp:  [97.5 F (36.4 C)-99.4 F (37.4 C)] 98.3 F (36.8 C) (03/14 1001) Pulse Rate:  [69-87] 75 (03/14 1000) Resp:  [15-26] 18 (03/14 1001) BP: (84-155)/(37-58) 112/48 (03/14 1000) SpO2:  [94 %-100 %] 99 % (03/14 1000) Weight:  [171 lb (77.6 kg)-181 lb (82.1 kg)] 181 lb (82.1 kg) (03/13 1458)   General:   Pleasant African American appears to be in NAD, Well developed, Well  nourished, alert and cooperative Head:  Normocephalic and atraumatic. Eyes:   PEERL, EOMI. No icterus. Conjunctiva pink. Ears:  Normal auditory acuity. Neck:  Supple Throat: Oral cavity and pharynx without inflammation, swelling or lesion. Lungs: Respirations even and unlabored. Lungs clear to auscultation bilaterally.   No wheezes, crackles, or rhonchi.  Heart: Normal S1, S2. No MRG. Regular rate and rhythm. 1+ pretibial edema b/l Abdomen:  Soft, nondistended, nontender. No rebound or guarding. Normal bowel sounds. No appreciable masses or hepatomegaly. Rectal:  Not performed.  Msk:  Symmetrical without gross deformities. Extremities:  Without edema, no deformity or joint abnormality.  Neurologic:  Alert and  oriented x4;  grossly normal neurologically.  Skin:   Dry and intact without significant lesions or rashes.pale, fluctuant nodule over right knee Psychiatric: Demonstrates good judgement and reason without abnormal affect or behaviors.  LAB RESULTS:  Recent Labs  09/10/16 1600 09/10/16 1900 09/11/16 0537  WBC 2.0*  --  1.7*  HGB 3.9* 3.4* 5.6*  HCT 12.2* 10.5* 16.6*  PLT 36*  --  30*   BMET  Recent Labs  09/10/16 1600 09/11/16 0537  NA 139 139  K 3.7 3.3*  CL 113* 113*  CO2 17* 17*  GLUCOSE 129* 111*  BUN 70* 61*  CREATININE 2.49* 2.17*  CALCIUM 8.2* 8.0*   LFT  Recent Labs  09/10/16 1600  PROT  5.6*  ALBUMIN 1.9*  AST 34  ALT 20  ALKPHOS 46  BILITOT 0.6   PT/INR No results for input(s): LABPROT, INR in the last 72 hours.  STUDIES: Dg Chest 2 View  Result Date: 09/10/2016 CLINICAL DATA:  Fever and progressive weakness EXAM: CHEST  2 VIEW COMPARISON:  August 16, 2016. FINDINGS: There is no edema or consolidation. There is cardiomegaly with mild pulmonary venous hypertension. No adenopathy. There is atherosclerotic calcification in the aorta. There is mild degenerative change in the thoracic spine. Patient is status post coronary artery bypass  grafting. IMPRESSION: Pulmonary vascular congestion without edema or consolidation. There is aortic atherosclerosis. Electronically Signed   By: Lowella Grip III M.D.   On: 09/10/2016 15:44     PREVIOUS ENDOSCOPIES:            See HPI   Impression / Plan:   Impression: 1. Acute blood loss anemia form UGIB: FOBT positive stool, hemoglobin 3.9 at time of presentation (8.0 08/16/16), BUN 70, recent finding of esophageal ulcers as below, also with sepsis which could be contributing, patient ordered 2 units PRBCs and platelets in ER; likely continued relation to findings below 2. History of recent EGD 08/12/16 with bleeding esophageal ulcers from [place], nonbleeding gastric and duodenal ulcer 3. Melena:continued for the past month since time of discharge, consider continued relation to above 4. Weakness:likely related to anemia 5. Sepsis:Presented with fever 102 Degrees F, leukopenia 2.9K, elevated lactate and hypotension, blood culture pending 6. Chronic Diastolic CHF  Plan: 1. Continue twice a day Protonix 40 mg IV 2. Agree with transfusion and monitoring hgb with further transfusion as needed <7 3. Continue empiric Vancomycin and Zosyn for now 4. Plan for EGD tomorrow once patient has had blood transfusions today 5. Will make patient NPO after midnight. 6. Please await any further recs from Dr. Carlean Purl  Thank you for your kind consultation, we will continue to follow.  Lavone Nian Lemmon  09/11/2016, 10:21 AM Pager #: [phone]    [facility] Attending   I have taken an interval history, reviewed the chart and examined the patient. I agree with the Advanced Practitioner's excellent  note, impression and recommendations.   Needs repeat EGD to evaluate melena and acute blood loss anemia. Would have expected healing of ulcers on PPI and off NSAID. ? Other process. If EGD does not give an answer will need to consider colonoscopy.  The risks and benefits as well as alternatives  of endoscopic procedure(s) have been discussed and reviewed. All questions answered. The patient agrees to proceed.  Gatha Mayer, MD, [facility] Gastroenterology [phone] (pager) [phone] after 5 PM, weekends and holidays  09/11/2016 1:22 PM

## 2016-09-11 NOTE — Progress Notes (Signed)
PROGRESS NOTE    [name]  HBZ:[id number] DOB: 09-Jan-1934 DOA: 09/10/2016 PCP: Melinda Crutch, MD   Brief Narrative: [name] is a 83 y.o. male with medical history significant for hypertension, coronary artery disease status post CABG, chronic diastolic CHF, MDS, and recent admission for acute blood loss anemia attributed to bleeding esophageal ulcers, now presenting to the emergency department with 3 weeks of progressive generalized weakness and one day of fevers. Patient was discharged from the hospital on 08/16/2016 after management for acute blood loss anemia attributed to bleeding esophageal ulcers. He reports being in much improved condition upon discharge and felt well at home until approximately 3 weeks ago when he noted the insidious development of generalized weakness. He reports continued melanotic stools since the hospital discharge, but denies abdominal pain or nausea   Assessment & Plan:   Principal Problem:   Symptomatic anemia Active Problems:   CAD (coronary artery disease)   HTN (hypertension)   MDS (myelodysplastic syndrome), low grade ([facility])   Pancytopenia ([facility])   Ulcer of esophagus with bleeding   Severe sepsis ([facility])   Chronic diastolic CHF (congestive heart failure) ([facility])   AKI (acute kidney injury) ([place])  1-Acute Blood loss anemia, GI bleed;  -Pt presents with melena, FOBT+ stool, Hgb 3.9 (8.0 on 08/16/16), BUN 70 -EGD (08/12/16) with bleeding esophageal ulcers and findings concerning for severe candidal esophagitis; he has been taking Diflucan and Protonix BID for past month at home. -Continue with IV protonix. Diflucan.  -He received 2 units PRBC. Hb increase to 5 from 3 on admission.  -Will transfuse 2 more units PRBC.  -repeat CB tonight.  -GI consulted, planning endoscopy 3-15.  2-Pancytopenia;   Has history MDS.  Worsening count in setting of infection or bleeding.  Transfusion as needed.  IV antibiotics.   3-Sepsis;   Presents with fever 102 F  , leukopenia to 2.9k, elevated lactate, and hypotension  He report dysuria. Continue with IV antibiotics in setting leukopenia.  Follow urine culture.  Blood culture no growth to date.  Lactic acid has decreased to 2 from 4.   4-Right knee with boil.  No redness or significant sing of infection.  Check X ray.  Obtain records from PCP office.   AKI; SCr is 2.49 on admission, up from 1.13 one month prior In setting of hypovolemia, anemia, infection.  Continue with IV fluids, blood transfusion.   Chronic diastolic CHF  Hold diuretics.   Hypertension  Hold for now nifedipine, Coreg, and Cardura due to hypotension, sepsis, GI bleed.    CAD - Status-post CABG  - Coreg held due to hypotension.   DVT prophylaxis: SCD. No anticoagulation in setting of GI bleed.  Code Status: Full code.  Family Communication: Daughter at bedside.  Disposition Plan:  Remain in the step down unit. 2 more units PRBC to be transfused.   Consultants:   GI  Procedures: None  Antimicrobials:  Vancomycin 3-13 Zosyn 3-13  Subjective: Alert in no distress.  Notice mild cough. Report dysuria.  Had cyst aspirated from right knee by PCP.   Objective: Vitals:   09/11/16 1223 09/11/16 1259 09/11/16 1323 09/11/16 1338  BP:  (!) 128/50  126/61  Pulse:   80   Resp: 16  20   Temp:  98 F (36.7 C)  98.3 F (36.8 C)  TempSrc:  Oral  Oral  SpO2:   94% 98%  Weight:      Height:  Intake/Output Summary (Last 24 hours) at 09/11/16 1353 Last data filed at 09/11/16 1323  Gross per 24 hour  Intake             6439 ml  Output              900 ml  Net             5539 ml   Filed Weights   09/10/16 1439 09/10/16 1458  Weight: 77.6 kg (171 lb) 82.1 kg (181 lb)    Examination:  General exam: Appears calm and comfortable  Respiratory system: Clear to auscultation. Respiratory effort normal. Cardiovascular system: S1 & S2 heard, RRR. No JVD, murmurs, rubs, gallops or clicks. No pedal  edema. Gastrointestinal system: Abdomen is nondistended, soft and nontender. No organomegaly or masses felt. Normal bowel sounds heard. Central nervous system: Alert and oriented. No focal neurological deficits. Extremities: Symmetric 5 x 5 power. Skin: small nodule right knee, no drainage,. No redness.  Psychiatry: Judgement and insight appear normal. Mood & affect appropriate.     Data Reviewed: I have personally reviewed following labs and imaging studies  CBC:  Recent Labs Lab 09/10/16 1600 09/10/16 1900 09/11/16 0537  WBC 2.0*  --  1.7*  NEUTROABS 1.6*  --  1.4*  HGB 3.9* 3.4* 5.6*  HCT 12.2* 10.5* 16.6*  MCV 82.4  --  84.7  PLT 36*  --  30*   Basic Metabolic Panel:  Recent Labs Lab 09/10/16 1600 09/11/16 0537  NA 139 139  K 3.7 3.3*  CL 113* 113*  CO2 17* 17*  GLUCOSE 129* 111*  BUN 70* 61*  CREATININE 2.49* 2.17*  CALCIUM 8.2* 8.0*   GFR: Estimated Creatinine Clearance: 30.7 mL/min (by C-G formula based on SCr of 2.17 mg/dL (H)). Liver Function Tests:  Recent Labs Lab 09/10/16 1600  AST 34  ALT 20  ALKPHOS 46  BILITOT 0.6  PROT 5.6*  ALBUMIN 1.9*   No results for input(s): LIPASE, AMYLASE in the last 168 hours. No results for input(s): AMMONIA in the last 168 hours. Coagulation Profile: No results for input(s): INR, PROTIME in the last 168 hours. Cardiac Enzymes: No results for input(s): CKTOTAL, CKMB, CKMBINDEX, TROPONINI in the last 168 hours. BNP (last 3 results) No results for input(s): PROBNP in the last 8760 hours. HbA1C: No results for input(s): HGBA1C in the last 72 hours. CBG:  Recent Labs Lab 09/11/16 0827  GLUCAP 117*   Lipid Profile: No results for input(s): CHOL, HDL, LDLCALC, TRIG, CHOLHDL, LDLDIRECT in the last 72 hours. Thyroid Function Tests: No results for input(s): TSH, T4TOTAL, FREET4, T3FREE, THYROIDAB in the last 72 hours. Anemia Panel: No results for input(s): VITAMINB12, FOLATE, FERRITIN, TIBC, IRON, RETICCTPCT  in the last 72 hours. Sepsis Labs:  Recent Labs Lab 09/10/16 1602 09/10/16 1911 09/11/16 0241  LATICACIDVEN 4.83* 3.27* 2.0*    Recent Results (from the past 240 hour(s))  Culture, blood (routine x 2)     Status: None (Preliminary result)   Collection Time: 09/10/16  3:11 PM  Result Value Ref Range Status   Specimen Description BLOOD RIGHT HAND  Final   Special Requests BOTTLES DRAWN AEROBIC ONLY 5CC  Final   Culture   Final    NO GROWTH < 24 HOURS Performed at [facility], [place] [address], [place], [place] [address]    Report Status PENDING  Incomplete  Culture, blood (routine x 2)     Status: None (Preliminary result)  Collection Time: 09/10/16  4:10 PM  Result Value Ref Range Status   Specimen Description RIGHT ANTECUBITAL  Final   Special Requests BOTTLES DRAWN AEROBIC AND ANAEROBIC 5CC  Final   Culture   Final    NO GROWTH < 24 HOURS Performed at [facility], [place] [address] [place], [place] [address]    Report Status PENDING  Incomplete         Radiology Studies: Dg Chest 2 View  Result Date: 09/10/2016 CLINICAL DATA:  Fever and progressive weakness EXAM: CHEST  2 VIEW COMPARISON:  August 16, 2016. FINDINGS: There is no edema or consolidation. There is cardiomegaly with mild pulmonary venous hypertension. No adenopathy. There is atherosclerotic calcification in the aorta. There is mild degenerative change in the thoracic spine. Patient is status post coronary artery bypass grafting. IMPRESSION: Pulmonary vascular congestion without edema or consolidation. There is aortic atherosclerosis. Electronically Signed   By: Lowella Grip III M.D.   On: 09/10/2016 15:44        Scheduled Meds: . allopurinol  300 mg Oral Daily  . atorvastatin  20 mg Oral q1800  . feeding supplement  1 Container Oral BID BM  . fluconazole  100 mg Oral Daily  . multivitamin with minerals  1 tablet Oral Daily  . pantoprazole (PROTONIX) IV  40 mg Intravenous Q12H  .  piperacillin-tazobactam (ZOSYN)  IV  3.375 g Intravenous Q8H  . sodium chloride flush  3 mL Intravenous Q12H  . vancomycin  1,000 mg Intravenous Q24H   Continuous Infusions:   LOS: 1 day    Time spent: 35 minutes.     Elmarie Shiley, MD [facility] Pager [phone]  If 7PM-7AM, please contact night-coverage [email or web address] Password [email or web address] 09/11/2016, 1:53 PM

## 2016-09-12 ENCOUNTER — Inpatient Hospital Stay (HOSPITAL_COMMUNITY): Payer: Medicare Other | Admitting: Anesthesiology

## 2016-09-12 ENCOUNTER — Encounter (HOSPITAL_COMMUNITY)
Admission: EM | Disposition: A | Discharge status: Hospice | Payer: Self-pay | Source: Home / Self Care | Attending: Internal Medicine

## 2016-09-12 ENCOUNTER — Encounter (HOSPITAL_COMMUNITY): Payer: Self-pay | Admitting: Anesthesiology

## 2016-09-12 DIAGNOSIS — N179 Acute kidney failure, unspecified: Secondary | ICD-10-CM

## 2016-09-12 DIAGNOSIS — D469 Myelodysplastic syndrome, unspecified: Secondary | ICD-10-CM

## 2016-09-12 DIAGNOSIS — E43 Unspecified severe protein-calorie malnutrition: Secondary | ICD-10-CM

## 2016-09-12 DIAGNOSIS — K259 Gastric ulcer, unspecified as acute or chronic, without hemorrhage or perforation: Secondary | ICD-10-CM

## 2016-09-12 DIAGNOSIS — D61818 Other pancytopenia: Secondary | ICD-10-CM

## 2016-09-12 DIAGNOSIS — K269 Duodenal ulcer, unspecified as acute or chronic, without hemorrhage or perforation: Secondary | ICD-10-CM

## 2016-09-12 HISTORY — PX: ESOPHAGOGASTRODUODENOSCOPY (EGD) WITH PROPOFOL: SHX5813

## 2016-09-12 LAB — SYNOVIAL CELL COUNT + DIFF, W/ CRYSTALS
CRYSTALS FLUID: NONE SEEN
Eosinophils-Synovial: 0 % (ref 0–1)
LYMPHOCYTES-SYNOVIAL FLD: 3 % (ref 0–20)
Monocyte-Macrophage-Synovial Fluid: 5 % — ABNORMAL LOW (ref 50–90)
NEUTROPHIL, SYNOVIAL: 92 % — AB (ref 0–25)
WBC, Synovial: UNDETERMINED /mm3 (ref 0–200)

## 2016-09-12 LAB — CBC WITH DIFFERENTIAL/PLATELET
Basophils Absolute: 0 10*3/uL (ref 0.0–0.1)
Basophils Relative: 0 %
Eosinophils Absolute: 0 10*3/uL (ref 0.0–0.7)
Eosinophils Relative: 2 %
HEMATOCRIT: 22.8 % — AB (ref 39.0–52.0)
Hemoglobin: 7.9 g/dL — ABNORMAL LOW (ref 13.0–17.0)
LYMPHS ABS: 0.2 10*3/uL — AB (ref 0.7–4.0)
LYMPHS PCT: 12 %
MCH: 28.4 pg (ref 26.0–34.0)
MCHC: 34.6 g/dL (ref 30.0–36.0)
MCV: 82 fL (ref 78.0–100.0)
MONO ABS: 0.1 10*3/uL (ref 0.1–1.0)
MONOS PCT: 5 %
NEUTROS ABS: 1.5 10*3/uL — AB (ref 1.7–7.7)
Neutrophils Relative %: 81 %
Platelets: 33 10*3/uL — ABNORMAL LOW (ref 150–400)
RBC: 2.78 MIL/uL — ABNORMAL LOW (ref 4.22–5.81)
RDW: 17.3 % — AB (ref 11.5–15.5)
WBC: 1.9 10*3/uL — ABNORMAL LOW (ref 4.0–10.5)

## 2016-09-12 LAB — BASIC METABOLIC PANEL
Anion gap: 7 (ref 5–15)
BUN: 50 mg/dL — ABNORMAL HIGH (ref 6–20)
CO2: 21 mmol/L — AB (ref 22–32)
Calcium: 8.3 mg/dL — ABNORMAL LOW (ref 8.9–10.3)
Chloride: 118 mmol/L — ABNORMAL HIGH (ref 101–111)
Creatinine, Ser: 1.79 mg/dL — ABNORMAL HIGH (ref 0.61–1.24)
GFR calc Af Amer: 40 mL/min — ABNORMAL LOW (ref >60)
GFR calc non Af Amer: 34 mL/min — ABNORMAL LOW (ref >60)
GLUCOSE: 100 mg/dL — AB (ref 65–99)
POTASSIUM: 3.1 mmol/L — AB (ref 3.5–5.1)
Sodium: 146 mmol/L — ABNORMAL HIGH (ref 135–145)

## 2016-09-12 LAB — HEMOGLOBIN AND HEMATOCRIT, BLOOD
HEMATOCRIT: 21.8 % — AB (ref 39.0–52.0)
Hemoglobin: 7.5 g/dL — ABNORMAL LOW (ref 13.0–17.0)

## 2016-09-12 LAB — BPAM PLATELET PHERESIS
Blood Product Expiration Date: 201803152359
ISSUE DATE / TIME: 201803140825
Unit Type and Rh: 6200

## 2016-09-12 LAB — CBC
HEMATOCRIT: 23.6 % — AB (ref 39.0–52.0)
Hemoglobin: 8.1 g/dL — ABNORMAL LOW (ref 13.0–17.0)
MCH: 28.1 pg (ref 26.0–34.0)
MCHC: 34.3 g/dL (ref 30.0–36.0)
MCV: 81.9 fL (ref 78.0–100.0)
Platelets: 50 10*3/uL — ABNORMAL LOW (ref 150–400)
RBC: 2.88 MIL/uL — ABNORMAL LOW (ref 4.22–5.81)
RDW: 18.2 % — ABNORMAL HIGH (ref 11.5–15.5)
WBC: 1.8 10*3/uL — AB (ref 4.0–10.5)

## 2016-09-12 LAB — PREPARE PLATELET PHERESIS: Unit division: 0

## 2016-09-12 LAB — GLUCOSE, CAPILLARY: GLUCOSE-CAPILLARY: 110 mg/dL — AB (ref 65–99)

## 2016-09-12 LAB — UREA NITROGEN, URINE: UREA NITROGEN UR: 391 mg/dL

## 2016-09-12 SURGERY — ESOPHAGOGASTRODUODENOSCOPY (EGD) WITH PROPOFOL
Anesthesia: Monitor Anesthesia Care

## 2016-09-12 MED ORDER — PROPOFOL 10 MG/ML IV BOLUS
INTRAVENOUS | Status: AC
  Filled 2016-09-12: qty 20

## 2016-09-12 MED ORDER — PROPOFOL 500 MG/50ML IV EMUL
INTRAVENOUS | Status: DC | PRN
  Administered 2016-09-12: 50 ug/kg/min via INTRAVENOUS

## 2016-09-12 MED ORDER — POTASSIUM CHLORIDE 2 MEQ/ML IV SOLN: 30.0000 meq | INTRAVENOUS | Status: DC

## 2016-09-12 MED ORDER — POTASSIUM CHLORIDE 10 MEQ/100ML IV SOLN
10.0000 meq | INTRAVENOUS | Status: AC
  Administered 2016-09-12 (×3): 10 meq via INTRAVENOUS
  Filled 2016-09-12 (×3): qty 100

## 2016-09-12 MED ORDER — PROPOFOL 10 MG/ML IV BOLUS
INTRAVENOUS | Status: DC | PRN
  Administered 2016-09-12 (×3): 20 mg via INTRAVENOUS

## 2016-09-12 MED ORDER — DEXTROSE 5 % IV SOLN
INTRAVENOUS | Status: DC
  Administered 2016-09-12 – 2016-09-14 (×4): via INTRAVENOUS

## 2016-09-12 MED ORDER — MUPIROCIN 2 % EX OINT
1.0000 "application " | TOPICAL_OINTMENT | Freq: Two times a day (BID) | CUTANEOUS | Status: AC
  Administered 2016-09-13 – 2016-09-16 (×9): 1 via NASAL
  Filled 2016-09-12 (×3): qty 22

## 2016-09-12 MED ORDER — POTASSIUM CHLORIDE CRYS ER 20 MEQ PO TBCR
40.0000 meq | EXTENDED_RELEASE_TABLET | Freq: Once | ORAL | Status: AC
  Administered 2016-09-12: 40 meq via ORAL
  Filled 2016-09-12: qty 2

## 2016-09-12 MED ORDER — SODIUM CHLORIDE 0.9 % IV SOLN
INTRAVENOUS | Status: DC
  Administered 2016-09-12 – 2016-09-15 (×2): via INTRAVENOUS

## 2016-09-12 MED ORDER — ENSURE ENLIVE PO LIQD
237.0000 mL | Freq: Two times a day (BID) | ORAL | Status: DC
  Administered 2016-09-12 – 2016-09-16 (×5): 237 mL via ORAL

## 2016-09-12 MED ORDER — CARVEDILOL 12.5 MG PO TABS
12.5000 mg | ORAL_TABLET | Freq: Two times a day (BID) | ORAL | Status: DC
  Administered 2016-09-12 – 2016-09-17 (×10): 12.5 mg via ORAL
  Filled 2016-09-12 (×9): qty 1

## 2016-09-12 MED ORDER — LIDOCAINE HCL 1 % IJ SOLN
INTRAMUSCULAR | Status: AC
  Filled 2016-09-12: qty 20

## 2016-09-12 MED ORDER — SODIUM CHLORIDE 0.9 % IV SOLN: Freq: Once | INTRAVENOUS | Status: DC

## 2016-09-12 MED ORDER — BOOST / RESOURCE BREEZE PO LIQD
1.0000 | ORAL | Status: DC
  Administered 2016-09-12: 16:00:00 via ORAL
  Administered 2016-09-13 – 2016-09-17 (×4): 1 via ORAL

## 2016-09-12 MED ORDER — CHLORHEXIDINE GLUCONATE CLOTH 2 % EX PADS
6.0000 | MEDICATED_PAD | Freq: Every day | CUTANEOUS | Status: DC
  Administered 2016-09-13 – 2016-09-16 (×4): 6 via TOPICAL

## 2016-09-12 MED ORDER — SODIUM CHLORIDE 0.9 % IV SOLN: 30.0000 meq | Freq: Once | INTRAVENOUS | Status: DC

## 2016-09-12 SURGICAL SUPPLY — 14 items

## 2016-09-12 NOTE — Brief Op Note (Signed)
09/10/2016 - 09/12/2016  9:42 AM  PATIENT:  [name]  83 y.o. male  PRE-OPERATIVE DIAGNOSIS:  Melena, ABLA  POST-OPERATIVE DIAGNOSIS:  Gastric ulcer, duodenal ulcer   PROCEDURE:  Procedure(s): ESOPHAGOGASTRODUODENOSCOPY (EGD) WITH PROPOFOL (N/A)  SURGEON:  Surgeon(s) and Role:    * Gatha Mayer, MD - Primary  ANESTHESIA:   MAC  EBL:  Total I/O In: 300 [IV Piggyback:300] Out: -   Findings  Anterior body ulcer 8 mm w/ red material in center and heaped up folds - bx  5 mm distal bulb ulcer w/ heaped up folds - bx  Small bulb submucosal nodule  Await path

## 2016-09-12 NOTE — Transfer of Care (Signed)
Immediate Anesthesia Transfer of Care Note  Patient: [name]  Procedure(s) Performed: Procedure(s): ESOPHAGOGASTRODUODENOSCOPY (EGD) WITH PROPOFOL (N/A)  Patient Location: PACU  Anesthesia Type:MAC  Level of Consciousness:  sedated, patient cooperative and responds to stimulation  Airway & Oxygen Therapy:Patient Spontanous Breathing and Patient connected to face mask oxgen  Post-op Assessment:  Report given to PACU RN and Post -op Vital signs reviewed and stable  Post vital signs:  Reviewed and stable  Last Vitals:  Vitals:   09/12/16 0800 09/12/16 0836  BP: (!) 129/49 140/65  Pulse: 78 74  Resp: 18 (!) 21  Temp:      Complications: No apparent anesthesia complications

## 2016-09-12 NOTE — Progress Notes (Signed)
Patient removed upper and lower dentures prior to rolling back for procedure, and caused abrasion to the right upper and lower corners of his lips. Some blood noted at sites.  Gauze placed.  Pain assessment a zero.  RN Continued to assess during procedure.  Report given to recovery RN, assessment of lips done in recovery and bleeding had stopped.  Pt. Had no c/o pain.  Dr. Carlean Purl made aware due to low plt count.  Platelets to be given upon pt return to floor.  VSS.  Laverta Baltimore, RN

## 2016-09-12 NOTE — Progress Notes (Signed)
Initial Nutrition Assessment  DOCUMENTATION CODES:   Severe malnutrition in context of chronic illness  INTERVENTION:  - Decrease Boost Breeze to q 24 hrs, each supplement provides 250 calories 9 grams protein - Provide Ensure Enlive BID (vanilla or strawberry), each supplement provides 350 calories and 20 grams protein   NUTRITION DIAGNOSIS:   Malnutrition related to chronic illness as evidenced by severe depletion of muscle mass, severe depletion of body fat, percent weight loss, energy intake < or equal to 75% for > or equal to 1 month.  GOAL:   Patient will meet greater than or equal to 90% of their needs  MONITOR:   PO intake, Supplement acceptance, Labs, Weight trends, Skin, I & O's  REASON FOR ASSESSMENT:   Malnutrition Screening Tool    ASSESSMENT:   83 y.o. Male with PMH of anemia of chronic disease, CAD, CHF, DM, Hyperlipidemia, HTN, MDS and Renal insuffiency presents with weakness and fever and upper GIB secondary to symptomatic anemia. Pt has Sepsis present with source unknown  Pt had family members at bedside to assist in collecting nutritional history.   Pt and pt's family report recent weight loss of 20 lbs in 1 months. Per chart review pt has experienced recent weight loss as well as continuous weight loss over the year. Pt has lost 29 lbs in 6 months (14.6%-significant for time period)  Wt Readings from Last 10 Encounters:  09/12/16 169 lb (76.7 kg)  08/12/16 181 lb (82.1 kg)  03/18/16 198 lb 11.2 oz (90.1 kg)  10/16/15 192 lb 12.8 oz (87.5 kg)   Per pt and pt's family report pt has not consumed a full meal in a little over a month before today. Pt's family reports he would take a few bites and maybe one boost throughout the day. While he was offered food numerous times throughout each day he would just refuse PTA.  Pt reports consuming his first meal today of chicken, creamed potatoes, a vegetable and a piece of cake. Pt states he is feeling good and  experiencing no N/V at this time.   Labs reviewed; Na (146), K (3.1), Chloride (118) Medications reviewed; Multivitamin with minerals, 40 mg Protonix, 75 mL/hr IV D5 (providing 360 calories), 20 mL IV Sodium chloride infusion  Nutrition-focused physical exam completed. Findings include moderate to severe fat depletion, moderate to severe muscle depletion and unable to assess edema.   Diet Order:  DIET SOFT Room service appropriate? Yes; Fluid consistency: Thin  Skin:  Wound (see comment) (Stage II Pressure Injury at Sacrum)  Last BM:  3/14  Height:   Ht Readings from Last 1 Encounters:  09/12/16 6\' 1"  (1.854 m)    Weight:   Wt Readings from Last 1 Encounters:  09/12/16 169 lb (76.7 kg)    Ideal Body Weight:  83.6 kg  BMI:  Body mass index is 22.3 kg/m.  Estimated Nutritional Needs:   Kcal:  1800-2000  Protein:  100-115 (1.3-1.5 g/kg)  Fluid:  >/= 2 L/d  EDUCATION NEEDS:   Education needs no appropriate at this time  [employer]

## 2016-09-12 NOTE — Interval H&P Note (Signed)
History and Physical Interval Note:  09/12/2016 8:56 AM  [name]  has presented today for surgery, with the diagnosis of Melena, ABLA  The various methods of treatment have been discussed with the patient and family. After consideration of risks, benefits and other options for treatment, the patient has consented to  Procedure(s): ESOPHAGOGASTRODUODENOSCOPY (EGD) WITH PROPOFOL (N/A) as a surgical intervention .  The patient's history has been reviewed, patient examined, no change in status, stable for surgery.  I have reviewed the patient's chart and labs.  Questions were answered to the patient's satisfaction.     Silvano Rusk

## 2016-09-12 NOTE — H&P (View-Only) (Signed)
Consultation  Referring Provider:  Dr. Tyrell Antonio    Primary Care Physician:  Melinda Crutch, MD Primary Gastroenterologist:   Dr. Henrene Pastor    Reason for Consultation:  Acute blood loss anemia, Melena            HPI:   [name] is an 83 y.o. male with a past medical history of anemia of chronic disease, coronary artery disease last echo 08/13/16 55 to 60%, diabetes, hyperlipidemia, hypertension, MDS and renal insufficiency as well as others listed below, who presented to the ER on 09/11/16 for generalized weakness and fever.   Per chart review, patient does have a recent history of admission for acute blood loss anemia related to bleeding esophageal ulcers, discharged on 08/16/16. See EGD below.    Today, the patient tells me he was apparently doing well until about 3 weeks ago when he started to develop generalized weakness, noting that he was just "more tired than usual'. He also tells me that he had continued with melanotic stool 2-3 times a week since time of discharge, but thought that this was "to be expected".  Patient denies abdominal pain and tells me has has avoided NSAID's and has been taking his Pantoprazole 40mg  BID since time of discharge.  He also did finish his Diflucan 2 weeks after d/c as prescribed.  Associated symptoms include some SOB and dizziness with change in position over the past week.   Recent medical history shows that patient had a "boil" overlying the anterior right knee that was incised and drained by his PCP 1 week ago and treated with Levaquin.    Patient denies fever, chills, weight loss or change in appetite.  ED Course: Upon arrival to the ED, patient is found to be afebrile, saturating adequately on room air, hypotensive with pressures in the 90/50 range, and stable heart rate and respirations. EKG features a sinus rhythm with first-degree AV nodal block, anterolateral T-wave inversions, and QTc of 560 ms. Chest x-ray is notable for pulmonary vascular congestion  without edema or consolidation. Chemistry panel reveals a BUN of 70 and serum creatinine of 2.49, up from 1.13 a month ago. CBC features of pancytopenia with WBC 2000, hemoglobin 3.9, down from 8.0 one month ago, and thrombocytopenia to 36,000. There are increased band forms on the CBC. Lactic acid is elevated to 4.83 and fecal occult blood testing is positive. Blood cultures were obtained, flu PCR was sent, 30 cc/kg NS bolus was given, 2 units of packed red blood cells were ordered for immediate transfusion, and the patient was started on empiric vancomycin and Zosyn.  Blood pressure remains soft in the ED with MAP persisting around 60. Gastroenterology and PCCM were consulted by the ED physician and will see the patient in consultation. Patient will be admitted to the stepdown unit for ongoing evaluation and management of acute blood loss anemia and sepsis with unknown source.   Previous GI history: 08/12/16-EGD, Dr. Silverio Decamp: Done for recent gastrointestinal bleeding suspected upper GI bleedat time of hospital admission; impression: Bleeding esophageal ulcers with adherent plaques concerning for severe Candida esophagitis, nonbleeding gastric ulcer with pigmented material, one non-bleeding duodenal ulcer with no stigmata of bleeding and normal second portion of the duodenum;recommendations: Patient started on fluconazole 100 mg by mouth daily for 2 weeks then told to return to GI office in 2-3 weeks with Dr. Henrene Pastor, repeat EGD is recommended in 2-3 minutes check healing 11/13/05-colonoscopy, Dr. Henrene Pastor: Done for surveillance of adenomatous polyps;impression:diminutive polyp in  the cecum, multiple polyps in the ascending colon to sigmoid colon ranging from 2-5 mm in size, total of 4 polyps, diverticulosis in the ascending colon to sigmoid colonwith marked changes with deep foldss well as internal hemorrhoids  Past Medical History:  Diagnosis Date  . Anemia, chronic disease    Followed by Dr. Lamonte Sakai  . Back pain  01/25/2014  . Coronary artery disease    CABG x4  -- with Est. EF of 50-60%  --  07/06/2007  . Diabetes mellitus   . GI bleed   . Gout   . Hyperlipidemia   . Hypertension    since his Mid -- 20's  . MDS (myelodysplastic syndrome), low grade ([place])   . Mild aortic stenosis   . Osteoarthritis   . Renal insufficiency   . Thrombocytopenia ([place])     Past Surgical History:  Procedure Laterality Date  . CARDIAC CATHETERIZATION  06/29/2007    Left heart catheterization with selective coronary angiography, left ventricular angiography -- Probable normal left ventricular function and dilated aortic root -- Extensive and heavy calcification of coronary arteries --  Ludwig Lean. Doreatha Lew, M.D.       . CORONARY ARTERY BYPASS GRAFT  07/06/2007    x4 using a left internal mammary  artery graft to the left anterior descending coronary artery with a saphenous vein graft to the diagonal branch to the left anterior descending artery, a saphenous vein graft to the obtuse marginal branch of the left circumflex coronary artery, and a saphenous vein graft to  the posterior descending artery of the right coronary artery -  Gilford Raid, M.D  . ESOPHAGOGASTRODUODENOSCOPY (EGD) WITH PROPOFOL N/A 08/12/2016   Procedure: ESOPHAGOGASTRODUODENOSCOPY (EGD) WITH PROPOFOL;  Surgeon: Mauri Pole, MD;  Location: [facility];  Service: Endoscopy;  Laterality: N/A;  . TOTAL KNEE ARTHROPLASTY  11/25/2008   Right knee - Severe degenerative arthritis with a varus deformity of the right knee --  Jori Moll A. Gladstone Lighter, M.D.     Family History  Problem Relation Age of Onset  . Cancer Daughter     colon ca    Social History  Substance Use Topics  . Smoking status: Former Smoker    Quit date: 07/01/1980  . Smokeless tobacco: Never Used  . Alcohol use No    Prior to Admission medications   Medication Sig Start Date End Date Taking? Authorizing Provider  acetaminophen (ACETAMINOPHEN 8 HOUR) 650 MG CR tablet Take 1,300 mg  by mouth every 8 (eight) hours as needed for pain.   Yes Historical Provider, MD  albuterol (PROVENTIL HFA;VENTOLIN HFA) 108 (90 Base) MCG/ACT inhaler Inhale 1-2 puffs into the lungs every 6 (six) hours as needed for wheezing or shortness of breath.   Yes Historical Provider, MD  allopurinol (ZYLOPRIM) 300 MG tablet Take 300 mg by mouth daily.     Yes Historical Provider, MD  atorvastatin (LIPITOR) 20 MG tablet TAKE 1 TABLET (20 MG TOTAL) BY MOUTH DAILY. 04/06/12  Yes Thayer Headings, MD  carvedilol (COREG) 25 MG tablet TAKE 1 TABLET (25 MG TOTAL) BY MOUTH 2 (TWO) TIMES DAILY. 12/26/14  Yes Thayer Headings, MD  doxazosin (CARDURA) 4 MG tablet Take 4 mg by mouth at bedtime.     Yes Historical Provider, MD  feeding supplement (BOOST / RESOURCE BREEZE) LIQD Take 1 Container by mouth 2 (two) times daily between meals. 08/16/16  Yes [place], DO  fluconazole (DIFLUCAN) 100 MG tablet Take 1 tablet (100 mg total) by  mouth daily. 08/17/16  Yes [place], DO  furosemide (LASIX) 80 MG tablet Take 80 mg by mouth daily.    Yes Historical Provider, MD  levofloxacin (LEVAQUIN) 750 MG tablet Take 750 mg by mouth daily. 09/03/16 09/13/16 Yes Historical Provider, MD  Multiple Vitamins-Minerals (MULTIVITAMIN WITH MINERALS) tablet Take 1 tablet by mouth daily.    Yes Historical Provider, MD  NIFEdipine (PROCARDIA-XL/ADALAT CC) 30 MG 24 hr tablet Take 1 tablet (30 mg total) by mouth daily. 08/21/16 09/20/16 Yes Patrecia Pour, MD  pantoprazole (PROTONIX) 40 MG tablet Take 1 tablet (40 mg total) by mouth 2 (two) times daily. 08/16/16  Yes [place], DO  potassium chloride SA (K-DUR,KLOR-CON) 20 MEQ tablet Take 20 mEq by mouth 2 (two) times daily.     Yes Historical Provider, MD    Current Facility-Administered Medications  Medication Dose Route Frequency Provider Last Rate Last Dose  . acetaminophen (TYLENOL) tablet 650 mg  650 mg Oral Q6H PRN Vianne Bulls, MD       Or  . acetaminophen (TYLENOL)  suppository 650 mg  650 mg Rectal Q6H PRN Ilene Qua Opyd, MD      . albuterol (PROVENTIL) (2.5 MG/3ML) 0.083% nebulizer solution 2.5 mg  2.5 mg Nebulization Q4H PRN Vianne Bulls, MD      . allopurinol (ZYLOPRIM) tablet 300 mg  300 mg Oral Daily Ilene Qua Opyd, MD      . atorvastatin (LIPITOR) tablet 20 mg  20 mg Oral q1800 Timothy S Opyd, MD      . feeding supplement (BOOST / RESOURCE BREEZE) liquid 1 Container  1 Container Oral BID BM Timothy S Opyd, MD      . fluconazole (DIFLUCAN) tablet 100 mg  100 mg Oral Daily Vianne Bulls, MD      . HYDROcodone-acetaminophen (NORCO/VICODIN) 5-325 MG per tablet 1-2 tablet  1-2 tablet Oral Q4H PRN Vianne Bulls, MD      . multivitamin with minerals tablet 1 tablet  1 tablet Oral Daily Timothy S Opyd, MD      . ondansetron (ZOFRAN) tablet 4 mg  4 mg Oral Q6H PRN Vianne Bulls, MD       Or  . ondansetron (ZOFRAN) injection 4 mg  4 mg Intravenous Q6H PRN Ilene Qua Opyd, MD      . pantoprazole (PROTONIX) injection 40 mg  40 mg Intravenous Q12H Vianne Bulls, MD   40 mg at 09/11/16 0119  . piperacillin-tazobactam (ZOSYN) IVPB 3.375 g  3.375 g Intravenous Q8H Berton Mount, RPH 12.5 mL/hr at 09/11/16 0611 3.375 g at 09/11/16 0611  . sodium chloride flush (NS) 0.9 % injection 3 mL  3 mL Intravenous Q12H Ilene Qua Opyd, MD   3 mL at 09/11/16 0127  . vancomycin (VANCOCIN) IVPB 1000 mg/200 mL premix  1,000 mg Intravenous Q24H Berton Mount, RPH 200 mL/hr at 09/11/16 0759 1,000 mg at 09/11/16 0759   Current Outpatient Prescriptions  Medication Sig Dispense Refill  . acetaminophen (ACETAMINOPHEN 8 HOUR) 650 MG CR tablet Take 1,300 mg by mouth every 8 (eight) hours as needed for pain.    Marland Kitchen albuterol (PROVENTIL HFA;VENTOLIN HFA) 108 (90 Base) MCG/ACT inhaler Inhale 1-2 puffs into the lungs every 6 (six) hours as needed for wheezing or shortness of breath.    . allopurinol (ZYLOPRIM) 300 MG tablet Take 300 mg by mouth daily.      Marland Kitchen atorvastatin (LIPITOR) 20  MG tablet TAKE 1  TABLET (20 MG TOTAL) BY MOUTH DAILY. 30 tablet 6  . carvedilol (COREG) 25 MG tablet TAKE 1 TABLET (25 MG TOTAL) BY MOUTH 2 (TWO) TIMES DAILY. 60 tablet 6  . doxazosin (CARDURA) 4 MG tablet Take 4 mg by mouth at bedtime.      . feeding supplement (BOOST / RESOURCE BREEZE) LIQD Take 1 Container by mouth 2 (two) times daily between meals. 4 Container 0  . fluconazole (DIFLUCAN) 100 MG tablet Take 1 tablet (100 mg total) by mouth daily. 30 tablet 0  . furosemide (LASIX) 80 MG tablet Take 80 mg by mouth daily.     Marland Kitchen levofloxacin (LEVAQUIN) 750 MG tablet Take 750 mg by mouth daily.    . Multiple Vitamins-Minerals (MULTIVITAMIN WITH MINERALS) tablet Take 1 tablet by mouth daily.     Marland Kitchen NIFEdipine (PROCARDIA-XL/ADALAT CC) 30 MG 24 hr tablet Take 1 tablet (30 mg total) by mouth daily. 30 tablet 0  . pantoprazole (PROTONIX) 40 MG tablet Take 1 tablet (40 mg total) by mouth 2 (two) times daily. 60 tablet 0  . potassium chloride SA (K-DUR,KLOR-CON) 20 MEQ tablet Take 20 mEq by mouth 2 (two) times daily.        Allergies as of 09/10/2016  . (No Known Allergies)     Review of Systems:    Constitutional: Positive for weakness Skin: No rash  Cardiovascular: No chest pain Respiratory: Positive for mild SOB Gastrointestinal: See HPI and otherwise negative Genitourinary: No dysuria or change in urinary frequency Neurological: No headache Musculoskeletal: No new muscle or joint pain Hematologic: No bruising Psychiatric: No history of depression or anxiety   Physical Exam:  Vital signs in last 24 hours: Temp:  [97.5 F (36.4 C)-99.4 F (37.4 C)] 98.3 F (36.8 C) (03/14 1001) Pulse Rate:  [69-87] 75 (03/14 1000) Resp:  [15-26] 18 (03/14 1001) BP: (84-155)/(37-58) 112/48 (03/14 1000) SpO2:  [94 %-100 %] 99 % (03/14 1000) Weight:  [171 lb (77.6 kg)-181 lb (82.1 kg)] 181 lb (82.1 kg) (03/13 1458)   General:   Pleasant African American appears to be in NAD, Well developed, Well  nourished, alert and cooperative Head:  Normocephalic and atraumatic. Eyes:   PEERL, EOMI. No icterus. Conjunctiva pink. Ears:  Normal auditory acuity. Neck:  Supple Throat: Oral cavity and pharynx without inflammation, swelling or lesion. Lungs: Respirations even and unlabored. Lungs clear to auscultation bilaterally.   No wheezes, crackles, or rhonchi.  Heart: Normal S1, S2. No MRG. Regular rate and rhythm. 1+ pretibial edema b/l Abdomen:  Soft, nondistended, nontender. No rebound or guarding. Normal bowel sounds. No appreciable masses or hepatomegaly. Rectal:  Not performed.  Msk:  Symmetrical without gross deformities. Extremities:  Without edema, no deformity or joint abnormality.  Neurologic:  Alert and  oriented x4;  grossly normal neurologically.  Skin:   Dry and intact without significant lesions or rashes.pale, fluctuant nodule over right knee Psychiatric: Demonstrates good judgement and reason without abnormal affect or behaviors.  LAB RESULTS:  Recent Labs  09/10/16 1600 09/10/16 1900 09/11/16 0537  WBC 2.0*  --  1.7*  HGB 3.9* 3.4* 5.6*  HCT 12.2* 10.5* 16.6*  PLT 36*  --  30*   BMET  Recent Labs  09/10/16 1600 09/11/16 0537  NA 139 139  K 3.7 3.3*  CL 113* 113*  CO2 17* 17*  GLUCOSE 129* 111*  BUN 70* 61*  CREATININE 2.49* 2.17*  CALCIUM 8.2* 8.0*   LFT  Recent Labs  09/10/16 1600  PROT  5.6*  ALBUMIN 1.9*  AST 34  ALT 20  ALKPHOS 46  BILITOT 0.6   PT/INR No results for input(s): LABPROT, INR in the last 72 hours.  STUDIES: Dg Chest 2 View  Result Date: 09/10/2016 CLINICAL DATA:  Fever and progressive weakness EXAM: CHEST  2 VIEW COMPARISON:  August 16, 2016. FINDINGS: There is no edema or consolidation. There is cardiomegaly with mild pulmonary venous hypertension. No adenopathy. There is atherosclerotic calcification in the aorta. There is mild degenerative change in the thoracic spine. Patient is status post coronary artery bypass  grafting. IMPRESSION: Pulmonary vascular congestion without edema or consolidation. There is aortic atherosclerosis. Electronically Signed   By: Lowella Grip III M.D.   On: 09/10/2016 15:44     PREVIOUS ENDOSCOPIES:            See HPI   Impression / Plan:   Impression: 1. Acute blood loss anemia form UGIB: FOBT positive stool, hemoglobin 3.9 at time of presentation (8.0 08/16/16), BUN 70, recent finding of esophageal ulcers as below, also with sepsis which could be contributing, patient ordered 2 units PRBCs and platelets in ER; likely continued relation to findings below 2. History of recent EGD 08/12/16 with bleeding esophageal ulcers from [place], nonbleeding gastric and duodenal ulcer 3. Melena:continued for the past month since time of discharge, consider continued relation to above 4. Weakness:likely related to anemia 5. Sepsis:Presented with fever 102 Degrees F, leukopenia 2.9K, elevated lactate and hypotension, blood culture pending 6. Chronic Diastolic CHF  Plan: 1. Continue twice a day Protonix 40 mg IV 2. Agree with transfusion and monitoring hgb with further transfusion as needed <7 3. Continue empiric Vancomycin and Zosyn for now 4. Plan for EGD tomorrow once patient has had blood transfusions today 5. Will make patient NPO after midnight. 6. Please await any further recs from Dr. Carlean Purl  Thank you for your kind consultation, we will continue to follow.  Lavone Nian Lemmon  09/11/2016, 10:21 AM Pager #: [phone]    [facility] Attending   I have taken an interval history, reviewed the chart and examined the patient. I agree with the Advanced Practitioner's excellent  note, impression and recommendations.   Needs repeat EGD to evaluate melena and acute blood loss anemia. Would have expected healing of ulcers on PPI and off NSAID. ? Other process. If EGD does not give an answer will need to consider colonoscopy.  The risks and benefits as well as alternatives  of endoscopic procedure(s) have been discussed and reviewed. All questions answered. The patient agrees to proceed.  Gatha Mayer, MD, [facility] [phone] (pager) [phone] after 5 PM, weekends and holidays  09/11/2016 1:22 PM

## 2016-09-12 NOTE — Consult Note (Signed)
ORTHOPAEDIC CONSULTATION  REQUESTING PHYSICIAN: Elmarie Shiley, MD  PCP:  Melinda Crutch, MD  Chief Complaint: Evaluate right knee  HPI: [name] is a 83 y.o. male who was admitted to [facility] for severe anemia. He underwent endoscopy today and was found to have 2 bleeding ulcers. He is followed by Dr. Alvy Bimler for myelodysplastic syndrome and pancytopenia. He had a right total knee replacement by Dr. Lindwood Qua in 2010. The patient tells me that he has had a "boil" on the anteromedial aspect of the knee for about 4 years. He does not endorse any significant knee pain. He reports that his knee is weak, and that he normally walks with a walker. He has not seen Dr. Gladstone Lighter in several years. He states that Dr. Harrington Challenger, his PCP, lanced and cultured the boil in the office. Chart review reveals the abscess grew Streptococcus anginosis. The patient is currently stable and terms of his blood pressure and heart rate. He is afebrile. He does not complain of any significant knee pain. He has been diagnosed with a urinary tract infection. Blood cultures have been negative. He is on IV Zosyn. Orthopedic consultation was placed for management of his right knee. I was asked to see the patient by Dr. Victorino December.  Past Medical History:  Diagnosis Date  . Anemia, chronic disease    Followed by Dr. Lamonte Sakai  . Back pain 01/25/2014  . Coronary artery disease    CABG x4  -- with Est. EF of 50-60%  --  07/06/2007  . Diabetes mellitus   . GI bleed   . Gout   . Hyperlipidemia   . Hypertension    since his Mid -- 20's  . MDS (myelodysplastic syndrome), low grade ([place])   . Mild aortic stenosis   . Osteoarthritis   . Renal insufficiency   . Thrombocytopenia ([place])    Past Surgical History:  Procedure Laterality Date  . CARDIAC CATHETERIZATION  06/29/2007    Left heart catheterization with selective coronary angiography, left ventricular angiography -- Probable normal left ventricular function and dilated  aortic root -- Extensive and heavy calcification of coronary arteries --  Ludwig Lean. Doreatha Lew, M.D.       . CORONARY ARTERY BYPASS GRAFT  07/06/2007    x4 using a left internal mammary  artery graft to the left anterior descending coronary artery with a saphenous vein graft to the diagonal branch to the left anterior descending artery, a saphenous vein graft to the obtuse marginal branch of the left circumflex coronary artery, and a saphenous vein graft to  the posterior descending artery of the right coronary artery -  Gilford Raid, M.D  . ESOPHAGOGASTRODUODENOSCOPY (EGD) WITH PROPOFOL N/A 08/12/2016   Procedure: ESOPHAGOGASTRODUODENOSCOPY (EGD) WITH PROPOFOL;  Surgeon: Mauri Pole, MD;  Location: [facility];  Service: Endoscopy;  Laterality: N/A;  . TOTAL KNEE ARTHROPLASTY  11/25/2008   Right knee - Severe degenerative arthritis with a varus deformity of the right knee --  Jori Moll A. Gladstone Lighter, M.D.    Social History   Social History  . Marital status: Married    Spouse name: N/A  . Number of children: N/A  . Years of education: N/A   Social History Main Topics  . Smoking status: Former Smoker    Quit date: 07/01/1980  . Smokeless tobacco: Never Used  . Alcohol use No  . Drug use: No  . Sexual activity: No   Other Topics Concern  . None   Social  History Narrative  . None   Family History  Problem Relation Age of Onset  . Cancer Daughter     colon ca   No Known Allergies Prior to Admission medications   Medication Sig Start Date End Date Taking? Authorizing Provider  acetaminophen (ACETAMINOPHEN 8 HOUR) 650 MG CR tablet Take 1,300 mg by mouth every 8 (eight) hours as needed for pain.   Yes Historical Provider, MD  albuterol (PROVENTIL HFA;VENTOLIN HFA) 108 (90 Base) MCG/ACT inhaler Inhale 1-2 puffs into the lungs every 6 (six) hours as needed for wheezing or shortness of breath.   Yes Historical Provider, MD  allopurinol (ZYLOPRIM) 300 MG tablet Take 300 mg by mouth daily.      Yes Historical Provider, MD  atorvastatin (LIPITOR) 20 MG tablet TAKE 1 TABLET (20 MG TOTAL) BY MOUTH DAILY. 04/06/12  Yes Thayer Headings, MD  carvedilol (COREG) 25 MG tablet TAKE 1 TABLET (25 MG TOTAL) BY MOUTH 2 (TWO) TIMES DAILY. 12/26/14  Yes Thayer Headings, MD  doxazosin (CARDURA) 4 MG tablet Take 4 mg by mouth at bedtime.     Yes Historical Provider, MD  feeding supplement (BOOST / RESOURCE BREEZE) LIQD Take 1 Container by mouth 2 (two) times daily between meals. 08/16/16  Yes [place], DO  fluconazole (DIFLUCAN) 100 MG tablet Take 1 tablet (100 mg total) by mouth daily. 08/17/16  Yes [place], DO  furosemide (LASIX) 80 MG tablet Take 80 mg by mouth daily.    Yes Historical Provider, MD  levofloxacin (LEVAQUIN) 750 MG tablet Take 750 mg by mouth daily. 09/03/16 09/13/16 Yes Historical Provider, MD  Multiple Vitamins-Minerals (MULTIVITAMIN WITH MINERALS) tablet Take 1 tablet by mouth daily.    Yes Historical Provider, MD  NIFEdipine (PROCARDIA-XL/ADALAT CC) 30 MG 24 hr tablet Take 1 tablet (30 mg total) by mouth daily. 08/21/16 09/20/16 Yes Patrecia Pour, MD  pantoprazole (PROTONIX) 40 MG tablet Take 1 tablet (40 mg total) by mouth 2 (two) times daily. 08/16/16  Yes [place], DO  potassium chloride SA (K-DUR,KLOR-CON) 20 MEQ tablet Take 20 mEq by mouth 2 (two) times daily.     Yes Historical Provider, MD   Dg Knee 3 View Right  Result Date: 09/11/2016 CLINICAL DATA:  Evaluate for effusion.  Boil. EXAM: RIGHT KNEE - 3 VIEW COMPARISON:  No recent prior . FINDINGS: Total knee replacement. Hardware intact. No acute bony abnormality identified. No evidence of acute fracture or dislocation. Multiple bony fragments and/or methylmethacrylate noted about the knee joint. This most likely chronic. Soft tissue swelling noted about the knee. Knee joint effusion most likely present . Peripheral vascular calcification. IMPRESSION: 1. Soft tissue swelling noted about the knee. Knee joint  effusion most likely present. 2. Total right knee replacement with adjacent prominent bony fragments and/or methylmethacrylate. Hardware intact. No acute abnormality identified. 3. Peripheral vascular disease . Electronically Signed   By: Marcello Moores  Register   On: 09/11/2016 16:08    Positive ROS: All other systems have been reviewed and were otherwise negative with the exception of those mentioned in the HPI and as above.  Physical Exam: General: Alert, no acute distress Cardiovascular: No pedal edema Respiratory: No cyanosis, no use of accessory musculature GI: No organomegaly, abdomen is soft and non-tender Skin: No lesions in the area of chief complaint Neurologic: Sensation intact distally Psychiatric: Patient is competent for consent with normal mood and affect Lymphatic: No axillary or cervical lymphadenopathy  MUSCULOSKELETAL: Examination of the right knee  reveals a healed anterior midline incision. Just medial to the patella, he has what appears to be a chronic draining sinus tract, that is not actively draining anything. He is unable to perform a straight leg raise. He flexes down past 100. There is not any significant pain with range of motion of the knee. There is no instability. He does have palpable pulses. There is no focal motor or sensory deficit.  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 I suspect he has a chronically infected right total knee replacement with a sinus tract. I recommended aspiration of the right knee. After verbal consent was obtained, I sterilely prepped and draped the right knee. I anesthetized the skin and subcutaneous tissues with 10 mL of 1% plain lidocaine. I then inserted an 18-gauge spinal needle using aseptic technique into the suprapatellar pouch of the right knee from a lateral approach. I removed the stylette, and I aspirated 20 mL of blood-tinged  joint fluid. I sent the joint fluid to the lab for stat cell count with differential and crystal identification, stat Gram stain, and culture. He tolerated the procedure well. Currently, the patient does not appear to be systemically ill from his right knee. I suspect that his knee has been infected for several years. I would recommend awaiting synovial culture and susceptibilities. The plan will be for chronic antibiotic suppression. He is currently not an operative candidate due to his pancytopenia. I discussed the patient with Dr. Gladstone Lighter, who plans to take over care tomorrow.    Aseneth Hack, Horald Pollen, MD Cell [phone]    09/12/2016 7:51 PM

## 2016-09-12 NOTE — Anesthesia Postprocedure Evaluation (Addendum)
Anesthesia Post Note  Patient: [name]  Procedure(s) Performed: Procedure(s) (LRB): ESOPHAGOGASTRODUODENOSCOPY (EGD) WITH PROPOFOL (N/A)  Patient location during evaluation: PACU Anesthesia Type: MAC Level of consciousness: awake and alert Pain management: pain level controlled Vital Signs Assessment: post-procedure vital signs reviewed and stable Respiratory status: spontaneous breathing and respiratory function stable Cardiovascular status: stable Anesthetic complications: no       Last Vitals:  Vitals:   09/12/16 0836 09/12/16 0939  BP: 140/65 (!) 111/92  Pulse: 74 75  Resp: (!) 21 (!) 22  Temp:  36.7 C    Last Pain:  Vitals:   09/12/16 0939  TempSrc: Oral                 Jaskirat Schwieger DANIEL

## 2016-09-12 NOTE — Progress Notes (Signed)
[facility] NOTE  Patient Care Team: Lawerance Cruel, MD as PCP - General (Family Medicine)  CHIEF COMPLAINTS/PURPOSE OF CONSULTATION:  Severe pancytopenia  HISTORY OF PRESENTING ILLNESS:  [name] 83 y.o. male questionable was admitted because of sepsis. This patient is well-known to me. 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  He had stable chronic pancytopenia. The patient had recurrent admission to the hospital since February 2018. He was admitted from 08/11/2014 2 08/16/2014 after presentation with generalized malaise and was subsequently diagnosed with pneumonia.  He also had evidence of GI bleed.  EGD showed evidence of esophagitis and candidiasis Soon after discharge, he was readmitted again 2 days ago with severe pancytopenia  On review of his CBC since 08/11/2016, he presented with severe pancytopenia with white count of 3.8, hemoglobin down to 4.1 and platelet count of 68.  He received multiple units of blood transfusions. On 09/10/2016, his white count is down to 2, hemoglobin 3.9 and platelet count of 36,000 He received multiple units of blood transfusion.  He also was found to have high-grade fever and sepsis protocol is initiated.  He was placed on broad-spectrum IV antibiotics. He is also receiving IV fluconazole He also have brief acute on chronic renal failure He is currently in ICU Since admission, he felt better.  He started to eat again today.  He denies sore throat, dysphagia, shortness of breath or cough. Apart from melena, he denies nosebleed or hematuria He has poor appetite and has lost some weight  MEDICAL HISTORY:  Past Medical History:  Diagnosis Date  . Anemia, chronic disease    Followed by Dr. Lamonte Sakai  . Back pain 01/25/2014  . Coronary artery disease    CABG x4  -- with Est. EF of 50-60%  --  07/06/2007  .  Diabetes mellitus   . GI bleed   . Gout   . Hyperlipidemia   . Hypertension    since his Mid -- 20's  . MDS (myelodysplastic syndrome), low grade ([place])   . Mild aortic stenosis   . Osteoarthritis   . Renal insufficiency   . Thrombocytopenia ([place])     SURGICAL HISTORY: Past Surgical History:  Procedure Laterality Date  . CARDIAC CATHETERIZATION  06/29/2007    Left heart catheterization with selective coronary angiography, left ventricular angiography -- Probable normal left ventricular function and dilated aortic root -- Extensive and heavy calcification of coronary arteries --  Ludwig Lean. Doreatha Lew, M.D.       . CORONARY ARTERY BYPASS GRAFT  07/06/2007    x4 using a left internal mammary  artery graft to the left anterior descending coronary artery with a saphenous vein graft to the diagonal branch to the left anterior descending artery, a saphenous vein graft to the obtuse marginal branch of the left circumflex coronary artery, and a saphenous vein graft to  the posterior descending artery of the right coronary artery -  Gilford Raid, M.D  . ESOPHAGOGASTRODUODENOSCOPY (EGD) WITH PROPOFOL N/A 08/12/2016   Procedure: ESOPHAGOGASTRODUODENOSCOPY (EGD) WITH PROPOFOL;  Surgeon: Mauri Pole, MD;  Location: [facility];  Service: Endoscopy;  Laterality: N/A;  . TOTAL KNEE ARTHROPLASTY  11/25/2008   Right knee - Severe degenerative arthritis with a varus deformity of the right knee --  Jori Moll A. [name], M.D.     SOCIAL HISTORY: Social History   Social History  . Marital  status: Married    Spouse name: N/A  . Number of children: N/A  . Years of education: N/A   Occupational History  . Not on file.   Social History Main Topics  . Smoking status: Former Smoker    Quit date: 07/01/1980  . Smokeless tobacco: Never Used  . Alcohol use No  . Drug use: No  . Sexual activity: No   Other Topics Concern  . Not on file   Social History Narrative  . No narrative on file    FAMILY  HISTORY: Family History  Problem Relation Age of Onset  . Cancer Daughter     colon ca    ALLERGIES:  has No Known Allergies.  MEDICATIONS:  Current Facility-Administered Medications  Medication Dose Route Frequency Provider Last Rate Last Dose  . 0.9 %  sodium chloride infusion   Intravenous Continuous Lavone Nian [place], [place]      . 0.9 %  sodium chloride infusion   Intravenous Once Belkys A Regalado, MD      . acetaminophen (TYLENOL) tablet 650 mg  650 mg Oral Q6H PRN Vianne Bulls, MD       Or  . acetaminophen (TYLENOL) suppository 650 mg  650 mg Rectal Q6H PRN Ilene Qua Opyd, MD      . albuterol (PROVENTIL) (2.5 MG/3ML) 0.083% nebulizer solution 2.5 mg  2.5 mg Nebulization Q4H PRN Vianne Bulls, MD      . allopurinol (ZYLOPRIM) tablet 300 mg  300 mg Oral Daily Vianne Bulls, MD   300 mg at 09/11/16 1128  . atorvastatin (LIPITOR) tablet 20 mg  20 mg Oral q1800 Vianne Bulls, MD   20 mg at 09/11/16 1833  . carvedilol (COREG) tablet 12.5 mg  12.5 mg Oral BID WC Belkys A Regalado, MD      . Chlorhexidine Gluconate Cloth 2 % PADS 6 each  6 each Topical Q0600 Belkys A Regalado, MD      . dextrose 5 % solution   Intravenous Continuous Belkys A Regalado, MD 75 mL/hr at 09/12/16 1045    . [START ON 09/13/2016] feeding supplement (BOOST / RESOURCE BREEZE) liquid 1 Container  1 Container Oral Q24H Belkys A Regalado, MD      . feeding supplement (ENSURE ENLIVE) (ENSURE ENLIVE) liquid 237 mL  237 mL Oral BID PC Belkys A Regalado, MD      . fluconazole (DIFLUCAN) tablet 100 mg  100 mg Oral Daily Vianne Bulls, MD   100 mg at 09/11/16 1128  . HYDROcodone-acetaminophen (NORCO/VICODIN) 5-325 MG per tablet 1-2 tablet  1-2 tablet Oral Q4H PRN Ilene Qua Opyd, MD      . lidocaine (XYLOCAINE) 1 % (with pres) injection           . multivitamin with minerals tablet 1 tablet  1 tablet Oral Daily Vianne Bulls, MD   1 tablet at 09/11/16 1128  . mupirocin ointment (BACTROBAN) 2 % 1 application  1  application Nasal BID Belkys A Regalado, MD      . ondansetron (ZOFRAN) tablet 4 mg  4 mg Oral Q6H PRN Vianne Bulls, MD       Or  . ondansetron (ZOFRAN) injection 4 mg  4 mg Intravenous Q6H PRN Ilene Qua Opyd, MD      . pantoprazole (PROTONIX) injection 40 mg  40 mg Intravenous Q12H Vianne Bulls, MD   40 mg at 09/11/16 2220  . piperacillin-tazobactam (ZOSYN) IVPB 3.375 g  3.375  g Intravenous Q8H Berton Mount, RPH 12.5 mL/hr at 09/12/16 1441 3.375 g at 09/12/16 1441  . sodium chloride flush (NS) 0.9 % injection 3 mL  3 mL Intravenous Q12H Vianne Bulls, MD   3 mL at 09/11/16 2221    REVIEW OF SYSTEMS:   Eyes: Denies blurriness of vision, double vision or watery eyes Ears, nose, mouth, throat, and face: Denies mucositis or sore throat Respiratory: Denies cough, dyspnea or wheezes Cardiovascular: Denies palpitation, chest discomfort or lower extremity swelling Gastrointestinal:  Denies nausea, heartburn or change in bowel habits Skin: Denies abnormal skin rashes Lymphatics: Denies new lymphadenopathy or easy bruising Behavioral/Psych: Mood is stable, no new changes  All other systems were reviewed with the patient and are negative.  PHYSICAL EXAMINATION: ECOG PERFORMANCE STATUS: 2 - Symptomatic, <50% confined to bed  Vitals:   09/12/16 1400 09/12/16 1600  BP: (!) 136/54   Pulse: 76   Resp: (!) 24   Temp:  98 F (36.7 C)   Filed Weights   09/10/16 1458 09/12/16 0500 09/12/16 0836  Weight: 181 lb (82.1 kg) 169 lb 15.6 oz (77.1 kg) 169 lb (76.7 kg)    GENERAL:alert, no distress and comfortable he looks very thin and cachectic.  Temporal muscle wasting is noted SKIN: skin color, texture, turgor are normal, no rashes or significant lesions EYES: normal, conjunctiva are pink and non-injected, sclera clear OROPHARYNX:no exudate, no erythema and lips, buccal mucosa, and tongue normal  NECK: supple, thyroid normal size, non-tender, without nodularity LYMPH:  no palpable  lymphadenopathy in the cervical, axillary or inguinal LUNGS: clear to auscultation and percussion with normal breathing effort HEART: regular rate & rhythm and no murmurs and no lower extremity edema ABDOMEN:abdomen soft, non-tender and normal bowel sounds Musculoskeletal:no cyanosis of digits and no clubbing  PSYCH: alert & oriented x 3 with fluent speech NEURO: no focal motor/sensory deficits  LABORATORY DATA:  I have reviewed the data as listed Lab Results  Component Value Date   WBC 1.9 (L) 09/12/2016   HGB 7.9 (L) 09/12/2016   HCT 22.8 (L) 09/12/2016   MCV 82.0 09/12/2016   PLT 33 (L) 09/12/2016    Recent Labs  08/15/16 0205 08/16/16 0518 09/10/16 1600 09/11/16 0537 09/12/16 0335  NA 141 140 139 139 146*  K 3.7 3.3* 3.7 3.3* 3.1*  CL 119* 118* 113* 113* 118*  CO2 19* 17* 17* 17* 21*  GLUCOSE 119* 122* 129* 111* 100*  BUN 27* 27* 70* 61* 50*  CREATININE 1.29* 1.13 2.49* 2.17* 1.79*  CALCIUM 8.1* 8.0* 8.2* 8.0* 8.3*  GFRNONAA 51* 59* 23* 27* 34*  GFRAA 59* >60 27* 31* 40*  PROT 5.6* 5.5* 5.6*  --   --   ALBUMIN 2.3* 2.3* 1.9*  --   --   AST 31 33 34  --   --   ALT 20 22 20   --   --   ALKPHOS 36* 44 46  --   --   BILITOT 1.2 1.0 0.6  --   --    I have reviewed his EGD report RADIOGRAPHIC STUDIES: I have personally reviewed the radiological images as listed and agreed with the findings in the report. Dg Chest 2 View  Result Date: 09/10/2016 CLINICAL DATA:  Fever and progressive weakness EXAM: CHEST  2 VIEW COMPARISON:  August 16, 2016. FINDINGS: There is no edema or consolidation. There is cardiomegaly with mild pulmonary venous hypertension. No adenopathy. There is atherosclerotic calcification in the aorta. There is  mild degenerative change in the thoracic spine. Patient is status post coronary artery bypass grafting. IMPRESSION: Pulmonary vascular congestion without edema or consolidation. There is aortic atherosclerosis. Electronically Signed   By: Lowella Grip III M.D.   On: 09/10/2016 15:44   Dg Chest Port 1 View  Result Date: 08/16/2016 CLINICAL DATA:  Shortness of breath. EXAM: PORTABLE CHEST 1 VIEW COMPARISON:  08/15/2016 . FINDINGS: Prior CABG. Stable cardiomegaly and mild pulmonary vascular prominence. Mild interstitial prominence. Mild CHF cannot be excluded. Low lung volumes. No prominent pleural effusion or pneumothorax . IMPRESSION: 1. Prior CABG. Cardiomegaly with pulmonary vascular prominence and mild interstitial prominence suggesting mild CHF. Interstitial prominence has slightly increased from prior exam. 2. Low lung volumes. Electronically Signed   By: Marcello Moores  Register   On: 08/16/2016 07:31   Dg Chest Port 1 View  Result Date: 08/15/2016 CLINICAL DATA:  Fever.  History of hypertension, diabetes. EXAM: PORTABLE CHEST 1 VIEW COMPARISON:  Chest radiograph August 11, 2016 FINDINGS: Cardiac silhouette is moderately enlarged unchanged. Tortuous calcified aorta. Pulmonary vascular congestion without pleural effusion or focal consolidation. Mildly elevated LEFT hemidiaphragm with LEFT lung base strandy densities. No pneumothorax. Soft tissue planes included osseous structures are nonsuspicious. IMPRESSION: Stable cardiomegaly and pulmonary vascular congestion. LEFT lung base atelectasis. Electronically Signed   By: Elon Alas M.D.   On: 08/15/2016 06:15   Dg Knee 3 View Right  Result Date: 09/11/2016 CLINICAL DATA:  Evaluate for effusion.  Boil. EXAM: RIGHT KNEE - 3 VIEW COMPARISON:  No recent prior . FINDINGS: Total knee replacement. Hardware intact. No acute bony abnormality identified. No evidence of acute fracture or dislocation. Multiple bony fragments and/or methylmethacrylate noted about the knee joint. This most likely chronic. Soft tissue swelling noted about the knee. Knee joint effusion most likely present . Peripheral vascular calcification. IMPRESSION: 1. Soft tissue swelling noted about the knee. Knee joint effusion  most likely present. 2. Total right knee replacement with adjacent prominent bony fragments and/or methylmethacrylate. Hardware intact. No acute abnormality identified. 3. Peripheral vascular disease . Electronically Signed   By: Marcello Moores  Register   On: 09/11/2016 16:08    ASSESSMENT & PLAN:  Severe pancytopenia, on background history of MDS  It is not uncommon to see significant bone marrow suppression in the setting of recent infection. Progression of MDS cannot be ruled out We discussed treatment options For now, the patient is comfortable to continue transfusion support only We would defer bone marrow biopsy to the future for now I recommend 2 units of blood to keep hemoglobin greater than 8 and 1 unit of platelets to keep platelet count greater than 10,000, unless he has bleeding; in that situation, transfuse platelets regardless of platelet count He should receive irradiated blood products given his MDS background and severe pancytopenia For now, without evidence of septic shock, we can hold off G-CSF for neutropenia  Acute on chronic renal failure Likely due to sepsis and dehydration Agree with fluid resuscitation  Severe protein calorie malnutrition Dietitian consult and nutritional supplement as tolerated  Recent fungal esophagitis He would need at least 10-14 days total course of treatment for this He denies symptomatic dysphagia  Goals of care The patient has incurable bone marrow disease He had recent significant decline due to infectious process Consider palliative care consult if not improving  Discharge planning He would likely need skilled nursing facility upon discharge I will continue close follow-up  All questions were answered. The patient knows to call the clinic with  any problems, questions or concerns.    Heath Lark, MD 09/12/2016 4:07 PM

## 2016-09-12 NOTE — Progress Notes (Signed)
PROGRESS NOTE    ZAKRY CASO  KZL:[id number] DOB: [birth year] DOA: 09/10/2016 PCP: Melinda Crutch, MD   Brief Narrative: [name] is a 83 y.o. male with medical history significant for hypertension, coronary artery disease status post CABG, chronic diastolic CHF, MDS, and recent admission for acute blood loss anemia attributed to bleeding esophageal ulcers, now presenting to the emergency department with 3 weeks of progressive generalized weakness and one day of fevers. Patient was discharged from the hospital on 08/16/2016 after management for acute blood loss anemia attributed to bleeding esophageal ulcers. He reports being in much improved condition upon discharge and felt well at home until approximately 3 weeks ago when he noted the insidious development of generalized weakness. He reports continued melanotic stools since the hospital discharge, but denies abdominal pain or nausea   Assessment & Plan:   Principal Problem:   Symptomatic anemia Active Problems:   CAD (coronary artery disease)   HTN (hypertension)   MDS (myelodysplastic syndrome), low grade (HCC)   Pancytopenia (HCC)   Ulcer of esophagus with bleeding   Severe sepsis (HCC)   Chronic diastolic CHF (congestive heart failure) (HCC)   AKI (acute kidney injury) ([place])  1-Acute Blood loss anemia, GI bleed;  -Pt presents with melena, FOBT+ stool, Hgb 3.9 (8.0 on 08/16/16), BUN 70 -EGD (08/12/16) with bleeding esophageal ulcers and findings concerning for severe candidal esophagitis; he has been taking Diflucan and Protonix BID for past month at home. -Continue with IV protonix. Diflucan.  -He has  received 4 units PRBC. Hb at 7.9.  -one units of platelet ordered for today.  -repeat CB tonight. transfusion as needed.  -GI consulted, Patient underwent endoscopy 3-15, which showed Gastric Ulcer and Duodenal Ulcer.    2-Pancytopenia;   Has history MDS.  Worsening count in setting of infection or bleeding.  Transfusion as  needed.  IV antibiotics.  Will informed Dr Alvy Bimler of patient admission.   3-Sepsis;   Presents with fever 102 F , leukopenia to 2.9k, elevated lactate, and hypotension  He report dysuria. Continue with IV antibiotics in setting leukopenia.   urine culture growing 40,000 ecoli Blood culture no growth to date.  Lactic acid has decreased to 2 from 4.  Stop vanc, continue with zosyn.   4-Right knee with boil.  No redness or significant sing of infection.  X ray with possible effusion/  I called PCP office culture from boil grew : light growth of streptococcus anginosis.  I have consulted Dr Cay Schillings.   Continue with zosyn.   AKI; SCr is 2.49 on admission, up from 1.13 one month prior In setting of hypovolemia, anemia, infection.  Continue with IV fluids, blood transfusion.  Improving.   Chronic diastolic CHF  Hold diuretics.   Hypertension  Hold for now nifedipine, and Cardura due to hypotension, sepsis, GI bleed.  Resume coreg.    CAD - Status-post CABG  - resume coreg.   Hypokalemia; replete orally.    DVT prophylaxis: SCD. No anticoagulation in setting of GI bleed.  Code Status: Full code.  Family Communication: Daughter at bedside.  Disposition Plan:  Remain in the step down unit.   Consultants:   GI  Procedures: None  Antimicrobials:  Vancomycin 3-13 Zosyn 3-13  Subjective: Denies abdominal pain. Had BM today.     Objective: Vitals:   09/12/16 1000 09/12/16 1111 09/12/16 1126 09/12/16 1230  BP: (!) 119/100 (!) 135/56 (!) 145/75 (!) 147/63  Pulse: 69 70 68 69  Resp:  19 (!)  21 19  Temp:  97.4 F (36.3 C) 97.6 F (36.4 C) 97.6 F (36.4 C)  TempSrc:  Oral Oral Oral  SpO2: 98% 99% 100% 100%  Weight:      Height:        Intake/Output Summary (Last 24 hours) at 09/12/16 1429 Last data filed at 09/12/16 1230  Gross per 24 hour  Intake          2029.08 ml  Output                0 ml  Net          2029.08 ml   Filed Weights   09/10/16 1458  09/12/16 0500 09/12/16 0836  Weight: 82.1 kg (181 lb) 77.1 kg (169 lb 15.6 oz) 76.7 kg (169 lb)    Examination:  General exam: Appears calm and comfortable  Respiratory system: Clear to auscultation. Respiratory effort normal. Cardiovascular system: S1 & S2 heard, RRR. No JVD, murmurs, rubs, gallops or clicks. No pedal edema. Gastrointestinal system: Abdomen is nondistended, soft and nontender. No organomegaly or masses felt. Normal bowel sounds heard. Central nervous system: Alert and oriented. No focal neurological deficits. Extremities: Symmetric 5 x 5 power. Skin: small nodule right knee, no drainage,. No redness.  Psychiatry: Judgement and insight appear normal. Mood & affect appropriate.     Data Reviewed: I have personally reviewed following labs and imaging studies  CBC:  Recent Labs Lab 09/10/16 1600 09/10/16 1900 09/11/16 0537 09/11/16 1644 09/11/16 2028 09/12/16 0335  WBC 2.0*  --  1.7*  --  1.9* 1.9*  NEUTROABS 1.6*  --  1.4*  --   --  1.5*  HGB 3.9* 3.4* 5.6* 7.2* 6.9* 7.9*  HCT 12.2* 10.5* 16.6* 21.4* 20.4* 22.8*  MCV 82.4  --  84.7  --  83.6 82.0  PLT 36*  --  30*  --  39* 33*   Basic Metabolic Panel:  Recent Labs Lab 09/10/16 1600 09/11/16 0537 09/12/16 0335  NA 139 139 146*  K 3.7 3.3* 3.1*  CL 113* 113* 118*  CO2 17* 17* 21*  GLUCOSE 129* 111* 100*  BUN 70* 61* 50*  CREATININE 2.49* 2.17* 1.79*  CALCIUM 8.2* 8.0* 8.3*   GFR: Estimated Creatinine Clearance: 35.7 mL/min (A) (by C-G formula based on SCr of 1.79 mg/dL (H)). Liver Function Tests:  Recent Labs Lab 09/10/16 1600  AST 34  ALT 20  ALKPHOS 46  BILITOT 0.6  PROT 5.6*  ALBUMIN 1.9*   No results for input(s): LIPASE, AMYLASE in the last 168 hours. No results for input(s): AMMONIA in the last 168 hours. Coagulation Profile: No results for input(s): INR, PROTIME in the last 168 hours. Cardiac Enzymes: No results for input(s): CKTOTAL, CKMB, CKMBINDEX, TROPONINI in the last 168  hours. BNP (last 3 results) No results for input(s): PROBNP in the last 8760 hours. HbA1C: No results for input(s): HGBA1C in the last 72 hours. CBG:  Recent Labs Lab 09/11/16 0827 09/12/16 1049  GLUCAP 117* 110*   Lipid Profile: No results for input(s): CHOL, HDL, LDLCALC, TRIG, CHOLHDL, LDLDIRECT in the last 72 hours. Thyroid Function Tests: No results for input(s): TSH, T4TOTAL, FREET4, T3FREE, THYROIDAB in the last 72 hours. Anemia Panel: No results for input(s): VITAMINB12, FOLATE, FERRITIN, TIBC, IRON, RETICCTPCT in the last 72 hours. Sepsis Labs:  Recent Labs Lab 09/10/16 1602 09/10/16 1911 09/11/16 0241  LATICACIDVEN 4.83* 3.27* 2.0*    Recent Results (from the past 240 hour(s))  Culture, blood (  routine x 2)     Status: None (Preliminary result)   Collection Time: 09/10/16  3:11 PM  Result Value Ref Range Status   Specimen Description BLOOD RIGHT HAND  Final   Special Requests BOTTLES DRAWN AEROBIC ONLY 5CC  Final   Culture   Final    NO GROWTH < 24 HOURS Performed at [facility], [place] [address] [place], [place] [address]    Report Status PENDING  Incomplete  Culture, blood (routine x 2)     Status: None (Preliminary result)   Collection Time: 09/10/16  4:10 PM  Result Value Ref Range Status   Specimen Description RIGHT ANTECUBITAL  Final   Special Requests BOTTLES DRAWN AEROBIC AND ANAEROBIC 5CC  Final   Culture   Final    NO GROWTH < 24 HOURS Performed at [facility], [place] [address], [place], [place] [address]    Report Status PENDING  Incomplete  Urine culture     Status: Abnormal (Preliminary result)   Collection Time: 09/11/16 12:39 AM  Result Value Ref Range Status   Specimen Description URINE, RANDOM  Final   Special Requests NONE  Final   Culture (A)  Final    40,000 COLONIES/mL ESCHERICHIA COLI SUSCEPTIBILITIES TO FOLLOW Performed at [facility], [place] [address], [place],  [address]    Report Status PENDING   Incomplete  MRSA PCR Screening     Status: Abnormal   Collection Time: 09/11/16 12:58 PM  Result Value Ref Range Status   MRSA by PCR POSITIVE (A) NEGATIVE Final    Comment:        The GeneXpert MRSA Assay (FDA approved for NASAL specimens only), is one component of a comprehensive MRSA colonization surveillance program. It is not intended to diagnose MRSA infection nor to guide or monitor treatment for MRSA infections. RESULT CALLED TO, READ BACK BY AND VERIFIED WITH: F.AKANBE RN AT 0175 ON 09/11/16 BY S.VANHOORNE          Radiology Studies: Dg Chest 2 View  Result Date: 09/10/2016 CLINICAL DATA:  Fever and progressive weakness EXAM: CHEST  2 VIEW COMPARISON:  August 16, 2016. FINDINGS: There is no edema or consolidation. There is cardiomegaly with mild pulmonary venous hypertension. No adenopathy. There is atherosclerotic calcification in the aorta. There is mild degenerative change in the thoracic spine. Patient is status post coronary artery bypass grafting. IMPRESSION: Pulmonary vascular congestion without edema or consolidation. There is aortic atherosclerosis. Electronically Signed   By: Lowella Grip III M.D.   On: 09/10/2016 15:44   Dg Knee 3 View Right  Result Date: 09/11/2016 CLINICAL DATA:  Evaluate for effusion.  Boil. EXAM: RIGHT KNEE - 3 VIEW COMPARISON:  No recent prior . FINDINGS: Total knee replacement. Hardware intact. No acute bony abnormality identified. No evidence of acute fracture or dislocation. Multiple bony fragments and/or methylmethacrylate noted about the knee joint. This most likely chronic. Soft tissue swelling noted about the knee. Knee joint effusion most likely present . Peripheral vascular calcification. IMPRESSION: 1. Soft tissue swelling noted about the knee. Knee joint effusion most likely present. 2. Total right knee replacement with adjacent prominent bony fragments and/or methylmethacrylate. Hardware intact. No acute abnormality  identified. 3. Peripheral vascular disease . Electronically Signed   By: Warrior   On: 09/11/2016 16:08        Scheduled Meds: . sodium chloride   Intravenous Once  . allopurinol  300 mg Oral Daily  . atorvastatin  20 mg Oral q1800  . Chlorhexidine Gluconate Cloth  6 each Topical Q0600  . feeding supplement  1 Container Oral BID BM  . fluconazole  100 mg Oral Daily  . multivitamin with minerals  1 tablet Oral Daily  . mupirocin ointment  1 application Nasal BID  . pantoprazole (PROTONIX) IV  40 mg Intravenous Q12H  . piperacillin-tazobactam (ZOSYN)  IV  3.375 g Intravenous Q8H  . potassium chloride  40 mEq Oral Once  . sodium chloride flush  3 mL Intravenous Q12H  . vancomycin  1,000 mg Intravenous Q24H   Continuous Infusions: . sodium chloride    . dextrose 75 mL/hr at 09/12/16 1045     LOS: 2 days    Time spent: 35 minutes.     Elmarie Shiley, MD [facility] Pager [phone]  If 7PM-7AM, please contact night-coverage [email or web address] Password [facility] 09/12/2016, 2:29 PM

## 2016-09-12 NOTE — Progress Notes (Signed)
Patient had large mushy incontinent BM,  brown in color, foul odor, huge amount.

## 2016-09-13 ENCOUNTER — Encounter (HOSPITAL_COMMUNITY): Payer: Self-pay | Admitting: Internal Medicine

## 2016-09-13 DIAGNOSIS — C801 Malignant (primary) neoplasm, unspecified: Secondary | ICD-10-CM | POA: Diagnosis present

## 2016-09-13 HISTORY — DX: Malignant (primary) neoplasm, unspecified: C80.1

## 2016-09-13 LAB — PREPARE PLATELET PHERESIS: UNIT DIVISION: 0

## 2016-09-13 LAB — CBC
HEMATOCRIT: 22.7 % — AB (ref 39.0–52.0)
HEMOGLOBIN: 7.7 g/dL — AB (ref 13.0–17.0)
MCH: 28.6 pg (ref 26.0–34.0)
MCHC: 33.9 g/dL (ref 30.0–36.0)
MCV: 84.4 fL (ref 78.0–100.0)
Platelets: 35 10*3/uL — ABNORMAL LOW (ref 150–400)
RBC: 2.69 MIL/uL — AB (ref 4.22–5.81)
RDW: 18.4 % — ABNORMAL HIGH (ref 11.5–15.5)
WBC: 1.5 10*3/uL — ABNORMAL LOW (ref 4.0–10.5)

## 2016-09-13 LAB — URINE CULTURE: Culture: 40000 — AB

## 2016-09-13 LAB — BASIC METABOLIC PANEL
Anion gap: 6 (ref 5–15)
BUN: 39 mg/dL — ABNORMAL HIGH (ref 6–20)
CHLORIDE: 116 mmol/L — AB (ref 101–111)
CO2: 19 mmol/L — AB (ref 22–32)
Calcium: 7.9 mg/dL — ABNORMAL LOW (ref 8.9–10.3)
Creatinine, Ser: 1.45 mg/dL — ABNORMAL HIGH (ref 0.61–1.24)
GFR calc Af Amer: 51 mL/min — ABNORMAL LOW (ref >60)
GFR calc non Af Amer: 44 mL/min — ABNORMAL LOW (ref >60)
Glucose, Bld: 149 mg/dL — ABNORMAL HIGH (ref 65–99)
POTASSIUM: 3.4 mmol/L — AB (ref 3.5–5.1)
SODIUM: 141 mmol/L (ref 135–145)

## 2016-09-13 LAB — BPAM PLATELET PHERESIS
Blood Product Expiration Date: 201803172359
ISSUE DATE / TIME: 201803151106
Unit Type and Rh: 600

## 2016-09-13 LAB — HEMOGLOBIN AND HEMATOCRIT, BLOOD
HCT: 25.8 % — ABNORMAL LOW (ref 39.0–52.0)
Hemoglobin: 8.7 g/dL — ABNORMAL LOW (ref 13.0–17.0)

## 2016-09-13 LAB — GLUCOSE, CAPILLARY
GLUCOSE-CAPILLARY: 143 mg/dL — AB (ref 65–99)
Glucose-Capillary: 118 mg/dL — ABNORMAL HIGH (ref 65–99)

## 2016-09-13 LAB — PREPARE RBC (CROSSMATCH)

## 2016-09-13 MED ORDER — POTASSIUM CHLORIDE CRYS ER 20 MEQ PO TBCR
40.0000 meq | EXTENDED_RELEASE_TABLET | Freq: Once | ORAL | Status: AC
  Administered 2016-09-13: 40 meq via ORAL
  Filled 2016-09-13: qty 2

## 2016-09-13 MED ORDER — SODIUM CHLORIDE 0.9 % IV SOLN
Freq: Once | INTRAVENOUS | Status: AC
  Administered 2016-09-13: 16:00:00 via INTRAVENOUS

## 2016-09-13 MED ORDER — FUROSEMIDE 10 MG/ML IJ SOLN
20.0000 mg | Freq: Once | INTRAMUSCULAR | Status: AC
  Administered 2016-09-13: 20 mg via INTRAVENOUS
  Filled 2016-09-13: qty 2

## 2016-09-13 NOTE — Progress Notes (Signed)
Progress Note   Subjective  Chief Complaint: ABLA, Melena  Pt with EGD yesterday showing anterior body ulcer 8 mm of red material in the center and heaped up folds, 5 mm distal bulb ulcer with heaped up folds and a small polyp and submucosal nodule, pathology pending.   This morning the patient is found sitting up in bed doing quite well. He tells me he has no further abdominal pain and for the first time in months was "hungry" this morning. He did eat a full breakfast. He does tell me he has continued with some dark-appearing stools but overall is feeling much better.   Objective   Vital signs in last 24 hours: Temp:  [97.4 F (36.3 C)-99.6 F (37.6 C)] 99 F (37.2 C) (03/16 0800) Pulse Rate:  [68-85] 72 (03/16 0800) Resp:  [18-26] 18 (03/16 0800) BP: (119-147)/(46-100) 129/68 (03/16 0800) SpO2:  [98 %-100 %] 100 % (03/16 0800) Weight:  [172 lb 9.9 oz (78.3 kg)] 172 lb 9.9 oz (78.3 kg) (03/16 0500) Last BM Date: 09/12/16 General: African American male in NAD Heart:  Regular rate and rhythm; no murmurs Lungs: Respirations even and unlabored, lungs CTA bilaterally Abdomen:  Soft, nontender and nondistended. Normal bowel sounds. Extremities:  Without edema. Neurologic:  Alert and oriented,  grossly normal neurologically. Psych:  Cooperative. Normal mood and affect.  Lab Results:  Recent Labs  09/12/16 0335 09/12/16 1542 09/12/16 2128 09/13/16 0350  WBC 1.9* 1.8*  --  1.5*  HGB 7.9* 8.1* 7.5* 7.7*  HCT 22.8* 23.6* 21.8* 22.7*  PLT 33* 50*  --  35*   BMET  Recent Labs  09/11/16 0537 09/12/16 0335 09/13/16 0350  NA 139 146* 141  K 3.3* 3.1* 3.4*  CL 113* 118* 116*  CO2 17* 21* 19*  GLUCOSE 111* 100* 149*  BUN 61* 50* 39*  CREATININE 2.17* 1.79* 1.45*  CALCIUM 8.0* 8.3* 7.9*    EGD 09/12/16-Dr. Carlean Purl Findings  Anterior body ulcer 8 mm w/ red material in center and heaped up folds - bx  5 mm distal bulb ulcer w/ heaped up folds - bx  Small bulb  submucosal nodule  Await path    Assessment / Plan:   Assessment: 1. Acute blood loss anemia from upper GI bleed:EGD yesterday with ulcers as above, continue with black stool today, tolerating regular diet this morning 2. History of recent EGD 08/12/16 with bleeding esophageal ulcers from Candida, nonbleeding gastric and duodenal ulcer 3. Melena  Plan: 1. Continue twice a day Protonix 2. Continue to monitor hemoglobin with transfusion as necessary, though this appears stable 3. Please await any further recommendations from Dr. Carlean Purl  Thank you for your kind consultation, we will continue to follow   LOS: 3 days   Levin Erp  09/13/2016, 9:56 AM  Pager # [phone]    [facility] Attending   I have taken an interval history, reviewed the chart and examined the patient. I agree with the Advanced Practitioner's note, impression and recommendations.   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 I explained this to him. It is my suspicion that these two discrete lesions represent metastases from an unknown primary.  His myelodysplasia is incurable. He could not take chemoTx I suspect.   ? Of searching for primary arises - would not do this until Dr. Alvy Bimler can speak to him - it seems to me that he is headed towards hospice based upon myelodysplasia and that looking for  a primary is probably of no use and potentially harmful. At this point would not o a contrasted CT scan as kidney fx not normal.   He is ok with not pursuing testing at this time. I cannot add any more so will sign off but call us back if needed - Dr. Benson Norway on the weekend.  Gatha Mayer, MD, [facility] [phone] (pager) [phone] after 5 PM, weekends and holidays  09/13/2016 6:23 PM

## 2016-09-13 NOTE — Progress Notes (Signed)
Pharmacy Antibiotic Note  [name] is a 83 y.o. male with hx MDS and GIB admitted on 09/10/2016 with sepsis.  Prior to admission patient was placed on levaquin s/p I&D of knee boil.  PCP office reported light growth of streptococcus anginosis from knee boil.  Patient was started on broad abx with vancomycin and zosyn for sepsis on admission and de-escalated to zosyn on 3/15.  Significant events:  - 3/13 CXR: no PNA noted - 3/14 XR of R knee: Soft tissue swelling noted about the knee. Knee joint effusion most likely present. Ortho consulted.  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,  recom chronic abx suppression;  EGD shows gastric ulcer and duodenal ulcer  Today, 09/13/2016: - afeb, wbc low - scr trending down 1.45 (crcl~45) -  LA down 2 on 3/14   Plan: - continue zosyn 3.375 gm IV q8h (infuse over 4 hours) - f/u culture - monitor renal function ______________________________  Height: 6\' 1"  (185.4 cm) Weight: 172 lb 9.9 oz (78.3 kg) IBW/kg (Calculated) : 79.9  Temp (24hrs), Avg:98.2 F (36.8 C), Min:97.4 F (36.3 C), Max:99.6 F (37.6 C)   Recent Labs Lab 09/10/16 1600 09/10/16 1602 09/10/16 1911 09/11/16 0241 09/11/16 0537 09/11/16 2028 09/12/16 0335 09/12/16 1542 09/13/16 0350  WBC 2.0*  --   --   --  1.7* 1.9* 1.9* 1.8* 1.5*  CREATININE 2.49*  --   --   --  2.17*  --  1.79*  --  1.45*  LATICACIDVEN  --  4.83* 3.27* 2.0*  --   --   --   --   --     Estimated Creatinine Clearance: 45 mL/min (A) (by C-G formula based on SCr of 1.45 mg/dL (H)).    No Known Allergies  Antimicrobials this admission: Home fluconazole resumed for candidal esophagitis  3/13 vanc >> 3/15 3/13 zosyn >>  Dose adjustments this admission: n/a  Microbiology results: 3/13 Flu PCR: neg 3/13 BCx 2: NGTD 3/14 UCx: 40K E.coli (R= amp, unasym, cipro) 314 MRSA PCR: positive 3/15 knee synovial fluid: 3/15 anaerobic synovial  fluid:    Thank you for allowing pharmacy to be a part of this patient's care.  Lynelle Doctor 09/13/2016 8:21 AM

## 2016-09-13 NOTE — Progress Notes (Signed)
PROGRESS NOTE    [name]  WNU:[id number] DOB: 08/29/1933 DOA: 09/10/2016 PCP: Melinda Crutch, MD   Brief Narrative: [name] is a 83 y.o. male with medical history significant for hypertension, coronary artery disease status post CABG, chronic diastolic CHF, MDS, and recent admission for acute blood loss anemia attributed to bleeding esophageal ulcers, now presenting to the emergency department with 3 weeks of progressive generalized weakness and one day of fevers. Patient was discharged from the hospital on 08/16/2016 after management for acute blood loss anemia attributed to bleeding esophageal ulcers. He reports being in much improved condition upon discharge and felt well at home until approximately 3 weeks ago when he noted the insidious development of generalized weakness. He reports continued melanotic stools since the hospital discharge, but denies abdominal pain or nausea   Assessment & Plan:   Principal Problem:   Symptomatic anemia Active Problems:   CAD (coronary artery disease)   HTN (hypertension)   MDS (myelodysplastic syndrome), low grade (HCC)   Pancytopenia (HCC)   Ulcer of esophagus with bleeding   Severe sepsis (HCC)   Chronic diastolic CHF (congestive heart failure) (HCC)   AKI (acute kidney injury) ([place])  1-Acute Blood loss anemia, GI bleed; Gastric Ulcer and Duodenal Ulcer -Pt presents with melena, FOBT+ stool, Hgb 3.9 (8.0 on 08/16/16), BUN 70 -EGD (08/12/16) with bleeding esophageal ulcers and findings concerning for severe candidal esophagitis; he has been taking Diflucan and Protonix BID for past month at home. -Continue with IV protonix. Diflucan.  -He has  received 4 units PRBC. Hb at 7..7.  -one units of platelet 3-15 -GI consulted, Patient underwent endoscopy 3-15, which showed Gastric Ulcer and Duodenal Ulcer.  -oncology recommending to keep hb above 8. Will transfuse 2 unit.   2-Pancytopenia;   Has history MDS.  Worsening count in setting of  infection or bleeding.  Transfusion as needed.  IV antibiotics.  Appreciate Dr Alvy Bimler evaluation. Plan to continue with support care.   3-Sepsis;   Presents with fever 102 F , leukopenia to 2.9k, elevated lactate, and hypotension  He report dysuria. Continue with IV antibiotics in setting leukopenia.  Urine culture growing 40,000 ecoli Blood culture no growth to date.  Lactic acid has decreased to 2 from 4.  Stop vanc, continue with zosyn.   4-Right knee with boil. Prior knee replacement.  X ray with possible effusion/  I called PCP office culture from boil grew : light growth of streptococcus anginosis.  I have consulted Dr Cay Schillings.  Patient may have sinus tract on his right knee. Follow culture results from aspiration from 3-15. He may need open debridement and poly exchange.  Continue with zosyn.   AKI; SCr is 2.49 on admission, up from 1.13 one month prior In setting of hypovolemia, anemia, infection.  Continue with IV fluids, blood transfusion.  Improving.   Chronic diastolic CHF  Hold diuretics.   Hypertension  Hold for now nifedipine, and Cardura due to hypotension, sepsis, GI bleed.  Resume coreg.    CAD - Status-post CABG  - resume coreg.   Hypokalemia; replete orally.    DVT prophylaxis: SCD. No anticoagulation in setting of GI bleed.  Code Status: Full code.  Family Communication: Daughter at bedside.  Disposition Plan:  Remain in the step down unit.   Consultants:   GI  Procedures: None  Antimicrobials:  Vancomycin 3-13 Zosyn 3-13  Subjective: Denies abdominal pain.  He is feeling well.  Had one BM this morning and one  last night     Objective: Vitals:   09/13/16 0400 09/13/16 0402 09/13/16 0500 09/13/16 0800  BP: (!) 129/46   129/68  Pulse: 75   72  Resp: (!) 22   18  Temp:  98.6 F (37 C)  99 F (37.2 C)  TempSrc:  Oral  Oral  SpO2: 100%   100%  Weight:   78.3 kg (172 lb 9.9 oz)   Height:        Intake/Output Summary (Last  24 hours) at 09/13/16 0919 Last data filed at 09/13/16 0730  Gross per 24 hour  Intake          2615.08 ml  Output             1100 ml  Net          1515.08 ml   Filed Weights   09/12/16 0500 09/12/16 0836 09/13/16 0500  Weight: 77.1 kg (169 lb 15.6 oz) 76.7 kg (169 lb) 78.3 kg (172 lb 9.9 oz)    Examination:  General exam: Appears calm and comfortable  Respiratory system: Clear to auscultation. Respiratory effort normal. Cardiovascular system: S1 & S2 heard, RRR. No JVD, murmurs, rubs, gallops or clicks. No pedal edema. Gastrointestinal system: Abdomen is nondistended, soft and nontender. No organomegaly or masses felt. Normal bowel sounds heard. Central nervous system: Alert and oriented. No focal neurological deficits. Extremities: Symmetric 5 x 5 power. Skin: small nodule right knee, no drainage,. No redness.  Psychiatry: Judgement and insight appear normal. Mood & affect appropriate.     Data Reviewed: I have personally reviewed following labs and imaging studies  CBC:  Recent Labs Lab 09/10/16 1600  09/11/16 0537  09/11/16 2028 09/12/16 0335 09/12/16 1542 09/12/16 2128 09/13/16 0350  WBC 2.0*  --  1.7*  --  1.9* 1.9* 1.8*  --  1.5*  NEUTROABS 1.6*  --  1.4*  --   --  1.5*  --   --   --   HGB 3.9*  < > 5.6*  < > 6.9* 7.9* 8.1* 7.5* 7.7*  HCT 12.2*  < > 16.6*  < > 20.4* 22.8* 23.6* 21.8* 22.7*  MCV 82.4  --  84.7  --  83.6 82.0 81.9  --  84.4  PLT 36*  --  30*  --  39* 33* 50*  --  35*  < > = values in this interval not displayed. Basic Metabolic Panel:  Recent Labs Lab 09/10/16 1600 09/11/16 0537 09/12/16 0335 09/13/16 0350  NA 139 139 146* 141  K 3.7 3.3* 3.1* 3.4*  CL 113* 113* 118* 116*  CO2 17* 17* 21* 19*  GLUCOSE 129* 111* 100* 149*  BUN 70* 61* 50* 39*  CREATININE 2.49* 2.17* 1.79* 1.45*  CALCIUM 8.2* 8.0* 8.3* 7.9*   GFR: Estimated Creatinine Clearance: 45 mL/min (A) (by C-G formula based on SCr of 1.45 mg/dL (H)). Liver Function  Tests:  Recent Labs Lab 09/10/16 1600  AST 34  ALT 20  ALKPHOS 46  BILITOT 0.6  PROT 5.6*  ALBUMIN 1.9*   No results for input(s): LIPASE, AMYLASE in the last 168 hours. No results for input(s): AMMONIA in the last 168 hours. Coagulation Profile: No results for input(s): INR, PROTIME in the last 168 hours. Cardiac Enzymes: No results for input(s): CKTOTAL, CKMB, CKMBINDEX, TROPONINI in the last 168 hours. BNP (last 3 results) No results for input(s): PROBNP in the last 8760 hours. HbA1C: No results for input(s): HGBA1C in the last 72  hours. CBG:  Recent Labs Lab 09/11/16 0827 09/12/16 1049 09/13/16 0749  GLUCAP 117* 110* 118*   Lipid Profile: No results for input(s): CHOL, HDL, LDLCALC, TRIG, CHOLHDL, LDLDIRECT in the last 72 hours. Thyroid Function Tests: No results for input(s): TSH, T4TOTAL, FREET4, T3FREE, THYROIDAB in the last 72 hours. Anemia Panel: No results for input(s): VITAMINB12, FOLATE, FERRITIN, TIBC, IRON, RETICCTPCT in the last 72 hours. Sepsis Labs:  Recent Labs Lab 09/10/16 1602 09/10/16 1911 09/11/16 0241  LATICACIDVEN 4.83* 3.27* 2.0*    Recent Results (from the past 240 hour(s))  Culture, blood (routine x 2)     Status: None (Preliminary result)   Collection Time: 09/10/16  3:11 PM  Result Value Ref Range Status   Specimen Description BLOOD RIGHT HAND  Final   Special Requests BOTTLES DRAWN AEROBIC ONLY 5CC  Final   Culture   Final    NO GROWTH 2 DAYS Performed at [facility], [place] [address] [place], [place] [address]    Report Status PENDING  Incomplete  Culture, blood (routine x 2)     Status: None (Preliminary result)   Collection Time: 09/10/16  4:10 PM  Result Value Ref Range Status   Specimen Description RIGHT ANTECUBITAL  Final   Special Requests BOTTLES DRAWN AEROBIC AND ANAEROBIC 5CC  Final   Culture   Final    NO GROWTH 2 DAYS Performed at [facility], [place] [address], [place], [place] [address]    Report  Status PENDING  Incomplete  Urine culture     Status: Abnormal   Collection Time: 09/11/16 12:39 AM  Result Value Ref Range Status   Specimen Description URINE, RANDOM  Final   Special Requests NONE  Final   Culture 40,000 COLONIES/mL ESCHERICHIA COLI (A)  Final   Report Status 09/13/2016 FINAL  Final   Organism ID, Bacteria ESCHERICHIA COLI (A)  Final      Susceptibility   Escherichia coli - MIC*    AMPICILLIN >=32 RESISTANT Resistant     CEFAZOLIN <=4 SENSITIVE Sensitive     CEFTRIAXONE <=1 SENSITIVE Sensitive     CIPROFLOXACIN >=4 RESISTANT Resistant     GENTAMICIN <=1 SENSITIVE Sensitive     IMIPENEM <=0.25 SENSITIVE Sensitive     NITROFURANTOIN <=16 SENSITIVE Sensitive     TRIMETH/SULFA <=20 SENSITIVE Sensitive     AMPICILLIN/SULBACTAM >=32 RESISTANT Resistant     PIP/TAZO <=4 SENSITIVE Sensitive     Extended ESBL NEGATIVE Sensitive     * 40,000 COLONIES/mL ESCHERICHIA COLI  MRSA PCR Screening     Status: Abnormal   Collection Time: 09/11/16 12:58 PM  Result Value Ref Range Status   MRSA by PCR POSITIVE (A) NEGATIVE Final    Comment:        The GeneXpert MRSA Assay (FDA approved for NASAL specimens only), is one component of a comprehensive MRSA colonization surveillance program. It is not intended to diagnose MRSA infection nor to guide or monitor treatment for MRSA infections. RESULT CALLED TO, READ BACK BY AND VERIFIED WITH: F.AKANBE RN AT 5009 ON 09/11/16 BY S.VANHOORNE   Body fluid culture     Status: None (Preliminary result)   Collection Time: 09/12/16  7:46 PM  Result Value Ref Range Status   Specimen Description SYNOVIAL RIGHT KNEE  Final   Special Requests Immunocompromised  Final   Gram Stain   Final    ABUNDANT WBC PRESENT, PREDOMINANTLY PMN NO ORGANISMS SEEN Gram Stain Report Called to,Read Back By  and Verified With: Dondra Spry RN [phone] 09/12/16 A NAVARRO    Culture PENDING  Incomplete   Report Status PENDING  Incomplete         Radiology  Studies: Dg Knee 3 View Right  Result Date: 09/11/2016 CLINICAL DATA:  Evaluate for effusion.  Boil. EXAM: RIGHT KNEE - 3 VIEW COMPARISON:  No recent prior . FINDINGS: Total knee replacement. Hardware intact. No acute bony abnormality identified. No evidence of acute fracture or dislocation. Multiple bony fragments and/or methylmethacrylate noted about the knee joint. This most likely chronic. Soft tissue swelling noted about the knee. Knee joint effusion most likely present . Peripheral vascular calcification. IMPRESSION: 1. Soft tissue swelling noted about the knee. Knee joint effusion most likely present. 2. Total right knee replacement with adjacent prominent bony fragments and/or methylmethacrylate. Hardware intact. No acute abnormality identified. 3. Peripheral vascular disease . Electronically Signed   By: Marcello Moores  Register   On: 09/11/2016 16:08        Scheduled Meds: . sodium chloride   Intravenous Once  . sodium chloride   Intravenous Once  . allopurinol  300 mg Oral Daily  . atorvastatin  20 mg Oral q1800  . carvedilol  12.5 mg Oral BID WC  . Chlorhexidine Gluconate Cloth  6 each Topical Q0600  . feeding supplement  1 Container Oral Q24H  . feeding supplement (ENSURE ENLIVE)  237 mL Oral BID PC  . fluconazole  100 mg Oral Daily  . furosemide  20 mg Intravenous Once  . multivitamin with minerals  1 tablet Oral Daily  . mupirocin ointment  1 application Nasal BID  . pantoprazole (PROTONIX) IV  40 mg Intravenous Q12H  . piperacillin-tazobactam (ZOSYN)  IV  3.375 g Intravenous Q8H  . potassium chloride  40 mEq Oral Once  . sodium chloride flush  3 mL Intravenous Q12H   Continuous Infusions: . sodium chloride    . dextrose 75 mL/hr at 09/13/16 0600     LOS: 3 days    Time spent: 35 minutes.     Elmarie Shiley, MD [facility] Pager [phone]  If 7PM-7AM, please contact night-coverage [email or web address] Password [facility] 09/13/2016, 9:19 AM

## 2016-09-13 NOTE — Progress Notes (Signed)
PT Cancellation Note  Patient Details Name: [name] MRN: [id number] DOB: 03-04-1934   Cancelled Treatment:    Reason Eval/Treat Not Completed: Patient not medically ready (to get blood. willcheck back later today.)   Marcelino Freestone PT [phone]  09/13/2016, 9:36 AM

## 2016-09-13 NOTE — Progress Notes (Signed)
[name]   DOB:[birth year]   WU#:[id number]    Subjective: He feels better today.  He ate a full breakfast.  He denies chest pain or shortness of breath.  No nausea or vomiting The patient denies any recent signs or symptoms of bleeding such as spontaneous epistaxis, hematuria or hematochezia.   Objective:  Vitals:   09/13/16 1200 09/13/16 1214  BP: (!) 126/50 (!) 139/55  Pulse: 75 73  Resp: 17 (!) 22  Temp:  97.6 F (36.4 C)     Intake/Output Summary (Last 24 hours) at 09/13/16 1227 Last data filed at 09/13/16 0730  Gross per 24 hour  Intake          2313.33 ml  Output             1100 ml  Net          1213.33 ml    GENERAL:alert, no distress and comfortable SKIN: skin color, texture, turgor are normal, no rashes or significant lesions EYES: normal, Conjunctiva are pink and non-injected, sclera clear Musculoskeletal:no cyanosis of digits and no clubbing  NEURO: alert & oriented x 3 with fluent speech, no focal motor/sensory deficits   Labs:  Lab Results  Component Value Date   WBC 1.5 (L) 09/13/2016   HGB 7.7 (L) 09/13/2016   HCT 22.7 (L) 09/13/2016   MCV 84.4 09/13/2016   PLT 35 (L) 09/13/2016   NEUTROABS 1.5 (L) 09/12/2016    Lab Results  Component Value Date   NA 141 09/13/2016   K 3.4 (L) 09/13/2016   CL 116 (H) 09/13/2016   CO2 19 (L) 09/13/2016    Studies:  Dg Knee 3 View Right  Result Date: 09/11/2016 CLINICAL DATA:  Evaluate for effusion.  Boil. EXAM: RIGHT KNEE - 3 VIEW COMPARISON:  No recent prior . FINDINGS: Total knee replacement. Hardware intact. No acute bony abnormality identified. No evidence of acute fracture or dislocation. Multiple bony fragments and/or methylmethacrylate noted about the knee joint. This most likely chronic. Soft tissue swelling noted about the knee. Knee joint effusion most likely present . Peripheral vascular calcification. IMPRESSION: 1. Soft tissue swelling noted about the knee. Knee joint effusion most likely present. 2.  Total right knee replacement with adjacent prominent bony fragments and/or methylmethacrylate. Hardware intact. No acute abnormality identified. 3. Peripheral vascular disease . Electronically Signed   By: Valier   On: 09/11/2016 16:08    Assessment & Plan:   Severe pancytopenia, on background history of MDS  It is not uncommon to see significant bone marrow suppression in the setting of recent infection. Progression of MDS cannot be ruled out We discussed treatment options For now, the patient is comfortable to continue transfusion support only We would defer bone marrow biopsy to the future for now I recommend 2 units of blood to keep hemoglobin greater than 8 (of 7 depending on primary service preference) and 1 unit of platelets to keep platelet count greater than 10,000, unless he has bleeding; in that situation, transfuse platelets regardless of platelet count He should receive irradiated blood products given his MDS background and severe pancytopenia For now, without evidence of septic shock, we can hold off G-CSF for neutropenia  Acute on chronic renal failure, resolving Likely due to sepsis and dehydration Agree with fluid resuscitation  Severe protein calorie malnutrition Dietitian consult and nutritional supplement as tolerated  Recent fungal esophagitis He would need at least 10-14 days total course of treatment for this He denies symptomatic dysphagia  Goals of care The patient has incurable bone marrow disease He had recent significant decline due to infectious process but overall improving Consider palliative care consult if not improving  Discharge planning He would likely need skilled nursing facility upon discharge The patient and his daughter would like to be in the same facility as his wife who was recently placed on hospice care I will continue close follow-up next week . Please call hematology service on call over the weekend if questions  arise   Heath Lark, MD 09/13/2016  12:27 PM

## 2016-09-13 NOTE — Care Management Note (Signed)
Case Management Note  Patient Details  Name: [name] MRN: [id number] Date of Birth: [birth year]  Subjective/Objective:   Weakness, anemia, neutropenia/                 Action/Plan:Date:  September 13, 2016 Chart reviewed for concurrent status and case management needs. Will continue to follow patient progress. Discharge Planning: following for needs/ Is using [facility] for home PT. Expected discharge date: [id number] [name], BSN, [place], [place]  Expected Discharge Date:   (unknown)               Expected Discharge Plan:  [place]  In-House Referral:     Discharge planning Services     Post Acute Care Choice:    Choice offered to:     DME Arranged:    DME Agency:     HH Arranged:  PT HH Agency:  [place]  Status of Service:  In process, will continue to follow  If discussed at Long Length of Stay Meetings, dates discussed:    Additional Comments:  Adam Cha, RN 09/13/2016, 10:17 AM

## 2016-09-13 NOTE — Progress Notes (Signed)
OT Cancellation Note  Patient Details Name: KERY BATZEL MRN: [id number] DOB: 1933-09-29   Cancelled Treatment:    Reason Eval/Treat Not Completed: Patient not medically ready-  Patient not medically ready (to get blood. willcheck back later today or next day)  [place], Thereasa Parkin 09/13/2016, 12:07 PM

## 2016-09-13 NOTE — Progress Notes (Signed)
Subjective: 1 Day Post-Op Procedure(s) (LRB): ESOPHAGOGASTRODUODENOSCOPY (EGD) WITH PROPOFOL (N/A) Patient reports pain as 2 on 0-10 scale.    Objective: Vital signs in last 24 hours: Temp:  [97.4 F (36.3 C)-99.6 F (37.6 C)] 98.6 F (37 C) (03/16 0402) Pulse Rate:  [68-85] 75 (03/16 0400) Resp:  [19-26] 22 (03/16 0400) BP: (111-147)/(46-100) 129/46 (03/16 0400) SpO2:  [98 %-100 %] 100 % (03/16 0400) Weight:  [76.7 kg (169 lb)-78.3 kg (172 lb 9.9 oz)] 78.3 kg (172 lb 9.9 oz) (03/16 0500)  Intake/Output from previous day: 03/15 0701 - 03/16 0700 In: 2915.1 [P.O.:600; I.V.:1473.8; Blood:441.3; IV Piggyback:400] Out: 800 [Urine:800] Intake/Output this shift: No intake/output data recorded.   Recent Labs  09/11/16 2028 09/12/16 0335 09/12/16 1542 09/12/16 2128 09/13/16 0350  HGB 6.9* 7.9* 8.1* 7.5* 7.7*    Recent Labs  09/12/16 1542 09/12/16 2128 09/13/16 0350  WBC 1.8*  --  1.5*  RBC 2.88*  --  2.69*  HCT 23.6* 21.8* 22.7*  PLT 50*  --  35*    Recent Labs  09/12/16 0335 09/13/16 0350  NA 146* 141  K 3.1* 3.4*  CL 118* 116*  CO2 21* 19*  BUN 50* 39*  CREATININE 1.79* 1.45*  GLUCOSE 100* 149*  CALCIUM 8.3* 7.9*   No results for input(s): LABPT, INR in the last 72 hours.  Appears to have a Sinus tract from his Right Knee. My associate aspirated his knee yesterday and we are waiting for his cultures. We may need to do an open dedridement and Poly exchange on him when he is medically stable.  Assessment/Plan: 1 Day Post-Op Procedure(s) (LRB): ESOPHAGOGASTRODUODENOSCOPY (EGD) WITH PROPOFOL (N/A) Up with therapy  Zhana Jeangilles A 09/13/2016, 8:15 AM

## 2016-09-13 NOTE — Progress Notes (Signed)
PT Cancellation Note  Patient Details Name: [name] MRN: [id number] DOB: [birth year]   Cancelled Treatment:    Reason Eval/Treat Not Completed: Other (comment) (still getting blood.)check back  tomorrow.   [name] 09/13/2016, 3:26 PM Tresa Endo PT [phone]

## 2016-09-13 NOTE — Op Note (Signed)
[name] Procedure Date: 09/12/2016 MRN: [id number] Attending MD: Gatha Mayer , MD Date of Birth: Sep 29, 1933 CSN: [id number] Age: 83 Admit Type: Inpatient Procedure:                Upper GI endoscopy Indications:              [place] Providers:                Gatha Mayer, MD, Laverta Baltimore RN, RN, Elspeth Cho Tech., Technician, Anne Fu                            CRNA, CRNA Referring MD:              Medicines:                Propofol per Anesthesia, Monitored Anesthesia Care Complications:            No immediate complications. Estimated Blood Loss:     Estimated blood loss was minimal. Procedure:                Pre-Anesthesia Assessment:                           - Prior to the procedure, a History and Physical                            was performed, and patient medications and                            allergies were reviewed. The patient's tolerance of                            previous anesthesia was also reviewed. The risks                            and benefits of the procedure and the sedation                            options and risks were discussed with the patient.                            All questions were answered, and informed consent                            was obtained. Prior Anticoagulants: The patient has                            taken no previous anticoagulant or antiplatelet                            agents. ASA Grade Assessment: III - A patient with  severe systemic disease. After reviewing the risks                            and benefits, the patient was deemed in                            satisfactory condition to undergo the procedure.                           After obtaining informed consent, the endoscope was                            passed under direct vision. Throughout the                            procedure, the  patient's blood pressure, pulse, and                            oxygen saturations were monitored continuously. The                            [id number] ([id number]) scope was introduced through the                            mouth, and advanced to the second part of duodenum.                            The upper GI endoscopy was accomplished without                            difficulty. The patient tolerated the procedure                            well. Scope In: Scope Out: Findings:      One non-bleeding cratered gastric ulcer with pigmented material was       found on the anterior wall of the stomach. The lesion was 10 mm in       largest dimension. Biopsies were taken with a cold forceps for       histology. Verification of patient identification for the specimen was       done. Estimated blood loss was minimal.      One non-bleeding cratered duodenal ulcer with pigmented material was       found in the duodenal bulb. The lesion was 5 mm in largest dimension.       Biopsies were taken with a cold forceps for histology. Verification of       patient identification for the specimen was done. Estimated blood loss       was minimal.      The exam was otherwise without abnormality.      The cardia and gastric fundus were otherwise normal on retroflexion. Impression:               - Non-bleeding gastric ulcer with pigmented  material. Biopsied.                           - One non-bleeding duodenal ulcer with pigmented                            material. Biopsied.                           - The examination was otherwise normal. Moderate Sedation:      N/A- Per Anesthesia Care Recommendation:           - Return patient to hospital ward for ongoing care.                           - Advance diet as tolerated.                           - Continue present medications.                           - Await pathology results. Procedure Code(s):        --- Professional  ---                           [id number], Esophagogastroduodenoscopy, flexible,                            transoral; with biopsy, single or multiple Diagnosis Code(s):        --- Professional ---                           K25.9, Gastric ulcer, unspecified as acute or                            chronic, without hemorrhage or perforation                           K26.9, Duodenal ulcer, unspecified as acute or                            chronic, without hemorrhage or perforation                           K92.1, Melena (includes Hematochezia) CPT copyright 2016 [employer]. All rights reserved. The codes documented in this report are preliminary and upon coder review may  be revised to meet current compliance requirements. Gatha Mayer, MD 09/13/2016 9:58:32 AM This report has been signed electronically. Number of Addenda: 0

## 2016-09-14 LAB — CBC
HEMATOCRIT: 24.1 % — AB (ref 39.0–52.0)
Hemoglobin: 8.1 g/dL — ABNORMAL LOW (ref 13.0–17.0)
MCH: 27.8 pg (ref 26.0–34.0)
MCHC: 33.6 g/dL (ref 30.0–36.0)
MCV: 82.8 fL (ref 78.0–100.0)
Platelets: 20 10*3/uL — CL (ref 150–400)
RBC: 2.91 MIL/uL — ABNORMAL LOW (ref 4.22–5.81)
RDW: 18.8 % — AB (ref 11.5–15.5)
WBC: 1.6 10*3/uL — ABNORMAL LOW (ref 4.0–10.5)

## 2016-09-14 LAB — BASIC METABOLIC PANEL
BUN: 31 mg/dL — ABNORMAL HIGH (ref 6–20)
CALCIUM: 7.5 mg/dL — AB (ref 8.9–10.3)
CO2: 23 mmol/L (ref 22–32)
Chloride: 112 mmol/L — ABNORMAL HIGH (ref 101–111)
Creatinine, Ser: 1.15 mg/dL (ref 0.61–1.24)
GFR calc Af Amer: >60 mL/min (ref >60)
GFR, EST NON AFRICAN AMERICAN: 58 mL/min — AB (ref >60)
GLUCOSE: 162 mg/dL — AB (ref 65–99)
Potassium: 3.5 mmol/L (ref 3.5–5.1)
Sodium: 137 mmol/L (ref 135–145)

## 2016-09-14 LAB — GLUCOSE, CAPILLARY
Glucose-Capillary: 130 mg/dL — ABNORMAL HIGH (ref 65–99)
Glucose-Capillary: 150 mg/dL — ABNORMAL HIGH (ref 65–99)

## 2016-09-14 MED ORDER — SODIUM CHLORIDE 0.9 % IV SOLN
Freq: Once | INTRAVENOUS | Status: AC
  Administered 2016-09-14: 14:00:00 via INTRAVENOUS

## 2016-09-14 MED ORDER — FUROSEMIDE 10 MG/ML IJ SOLN
20.0000 mg | Freq: Once | INTRAMUSCULAR | Status: AC
  Administered 2016-09-14: 20 mg via INTRAVENOUS
  Filled 2016-09-14: qty 2

## 2016-09-14 NOTE — Progress Notes (Signed)
PROGRESS NOTE    Adam EHRMANN  TIW:[id number] DOB: Jan 01, 1934 DOA: 09/10/2016 PCP: Melinda Crutch, MD   Brief Narrative: [name] is a 83 y.o. male with medical history significant for hypertension, coronary artery disease status post CABG, chronic diastolic CHF, MDS, and recent admission for acute blood loss anemia attributed to bleeding esophageal ulcers, now presenting to the emergency department with 3 weeks of progressive generalized weakness and one day of fevers. Patient was discharged from the hospital on 08/16/2016 after management for acute blood loss anemia attributed to bleeding esophageal ulcers. He reports being in much improved condition upon discharge and felt well at home until approximately 3 weeks ago when he noted the insidious development of generalized weakness. He reports continued melanotic stools since the hospital discharge, but denies abdominal pain or nausea   Assessment & Plan:   Principal Problem:   Symptomatic anemia Active Problems:   CAD (coronary artery disease)   HTN (hypertension)   MDS (myelodysplastic syndrome), low grade (HCC)   Pancytopenia (HCC)   Ulcer of esophagus with bleeding   Severe sepsis (HCC)   Chronic diastolic CHF (congestive heart failure) (HCC)   AKI (acute kidney injury) ([place])   Adenocarcinoma of unknown origin ([place])  1-Acute Blood loss anemia, GI bleed; Gastric Ulcer and Duodenal Ulcer; Adenocarcinoma -Pt presents with melena, FOBT+ stool, Hgb 3.9 (8.0 on 08/16/16), BUN 70 -EGD (08/12/16) with bleeding esophageal ulcers and findings concerning for severe candidal esophagitis; he has been taking Diflucan and Protonix BID for past month at home. -Continue with IV protonix. Diflucan.  -He has  received 4 units PRBC. Hb at 7..7.  -one units of platelet 3-15 -GI consulted, Patient underwent endoscopy 3-15, which showed Gastric Ulcer and Duodenal Ulcer.  -oncology recommending to keep hb above 8. Received 2 units PRBC 3-16 -biopsy came  back with adenocarcinoma. I have informed results to Dr Lindi Adie with oncology  2-Pancytopenia;   Has history MDS.  Worsening count in setting of infection or bleeding.  Transfusion as needed.  IV antibiotics.  Appreciate Dr Alvy Bimler evaluation. Plan to continue with support care.    3-Sepsis;   Presents with fever 102 F , leukopenia to 2.9k, elevated lactate, and hypotension  He report dysuria. Continue with IV antibiotics in setting leukopenia.  Urine culture growing 40,000 ecoli Blood culture no growth to date.  Lactic acid has decreased to 2 from 4.  Stop vanc, continue with zosyn.   4-Right knee with boil. Prior knee replacement.  X ray with possible effusion/  I called PCP office culture from boil grew : light growth of streptococcus anginosis.  I have consulted Dr Cay Schillings.  Patient may have sinus tract on his right knee. Follow culture results from aspiration from 3-15. He may need open debridement and poly exchange.  Continue with zosyn.  Culture no growth.   AKI; SCr is 2.49 on admission, up from 1.13 one month prior In setting of hypovolemia, anemia, infection.  NSL fluids.  Improving.   Chronic diastolic CHF  Hold diuretics.   Hypertension  Hold for now nifedipine, and Cardura due to hypotension, sepsis, GI bleed.  Resume coreg.    CAD - Status-post CABG  - resume coreg.   Hypokalemia; replete orally.    DVT prophylaxis: SCD. No anticoagulation in setting of GI bleed.  Code Status: Full code.  Family Communication: Will discussed with Daughter today, will follow oncology evaluation, and will discussed with family regarding consulting palliative care.  Disposition Plan:  Remain in  the step down unit.   Consultants:   GI  Procedures: None  Antimicrobials:  Vancomycin 3-13 Zosyn 3-13  Subjective: He is aware of results of biopsy. He prefer for me to speak with his daughter when she comes to hospital today.  He had black stool today.      Objective: Vitals:   09/14/16 0309 09/14/16 0400 09/14/16 0500 09/14/16 0800  BP:  (!) 142/55  (!) 123/52  Pulse:  85  80  Resp:  (!) 24  (!) 21  Temp: 97.8 F (36.6 C)   99.2 F (37.3 C)  TempSrc: Oral   Oral  SpO2:  100%  100%  Weight:   79.5 kg (175 lb 4.3 oz)   Height:        Intake/Output Summary (Last 24 hours) at 09/14/16 1143 Last data filed at 09/14/16 0800  Gross per 24 hour  Intake             2488 ml  Output             1650 ml  Net              838 ml   Filed Weights   09/12/16 0836 09/13/16 0500 09/14/16 0500  Weight: 76.7 kg (169 lb) 78.3 kg (172 lb 9.9 oz) 79.5 kg (175 lb 4.3 oz)    Examination:  General exam: Appears calm and comfortable  Respiratory system: Clear to auscultation. Respiratory effort normal. Cardiovascular system: S1 & S2 heard, RRR. No JVD, murmurs, rubs, gallops or clicks. No pedal edema. Gastrointestinal system: Abdomen is nondistended, soft and nontender. No organomegaly or masses felt. Normal bowel sounds heard. Central nervous system: Alert and oriented. No focal neurological deficits. Extremities: Symmetric 5 x 5 power. Skin: small nodule right knee, no drainage,. No redness.  Psychiatry: Judgement and insight appear normal. Mood & affect appropriate.     Data Reviewed: I have personally reviewed following labs and imaging studies  CBC:  Recent Labs Lab 09/10/16 1600  09/11/16 0537  09/11/16 2028 09/12/16 0335 09/12/16 1542 09/12/16 2128 09/13/16 0350 09/13/16 2032  WBC 2.0*  --  1.7*  --  1.9* 1.9* 1.8*  --  1.5*  --   NEUTROABS 1.6*  --  1.4*  --   --  1.5*  --   --   --   --   HGB 3.9*  < > 5.6*  < > 6.9* 7.9* 8.1* 7.5* 7.7* 8.7*  HCT 12.2*  < > 16.6*  < > 20.4* 22.8* 23.6* 21.8* 22.7* 25.8*  MCV 82.4  --  84.7  --  83.6 82.0 81.9  --  84.4  --   PLT 36*  --  30*  --  39* 33* 50*  --  35*  --   < > = values in this interval not displayed. Basic Metabolic Panel:  Recent Labs Lab 09/10/16 1600  09/11/16 0537 09/12/16 0335 09/13/16 0350 09/14/16 1045  NA 139 139 146* 141 137  K 3.7 3.3* 3.1* 3.4* 3.5  CL 113* 113* 118* 116* 112*  CO2 17* 17* 21* 19* 23  GLUCOSE 129* 111* 100* 149* 162*  BUN 70* 61* 50* 39* 31*  CREATININE 2.49* 2.17* 1.79* 1.45* 1.15  CALCIUM 8.2* 8.0* 8.3* 7.9* 7.5*   GFR: Estimated Creatinine Clearance: 57.6 mL/min (by C-G formula based on SCr of 1.15 mg/dL). Liver Function Tests:  Recent Labs Lab 09/10/16 1600  AST 34  ALT 20  ALKPHOS 46  BILITOT 0.6  PROT 5.6*  ALBUMIN 1.9*   No results for input(s): LIPASE, AMYLASE in the last 168 hours. No results for input(s): AMMONIA in the last 168 hours. Coagulation Profile: No results for input(s): INR, PROTIME in the last 168 hours. Cardiac Enzymes: No results for input(s): CKTOTAL, CKMB, CKMBINDEX, TROPONINI in the last 168 hours. BNP (last 3 results) No results for input(s): PROBNP in the last 8760 hours. HbA1C: No results for input(s): HGBA1C in the last 72 hours. CBG:  Recent Labs Lab 09/11/16 0827 09/12/16 1049 09/13/16 0749 09/13/16 1707 09/14/16 0751  GLUCAP 117* 110* 118* 143* 130*   Lipid Profile: No results for input(s): CHOL, HDL, LDLCALC, TRIG, CHOLHDL, LDLDIRECT in the last 72 hours. Thyroid Function Tests: No results for input(s): TSH, T4TOTAL, FREET4, T3FREE, THYROIDAB in the last 72 hours. Anemia Panel: No results for input(s): VITAMINB12, FOLATE, FERRITIN, TIBC, IRON, RETICCTPCT in the last 72 hours. Sepsis Labs:  Recent Labs Lab 09/10/16 1602 09/10/16 1911 09/11/16 0241  LATICACIDVEN 4.83* 3.27* 2.0*    Recent Results (from the past 240 hour(s))  Culture, blood (routine x 2)     Status: None (Preliminary result)   Collection Time: 09/10/16  3:11 PM  Result Value Ref Range Status   Specimen Description BLOOD RIGHT HAND  Final   Special Requests BOTTLES DRAWN AEROBIC ONLY 5CC  Final   Culture   Final    NO GROWTH 3 DAYS Performed at [facility],  [place] [address], [place], [place] [address]    Report Status PENDING  Incomplete  Culture, blood (routine x 2)     Status: None (Preliminary result)   Collection Time: 09/10/16  4:10 PM  Result Value Ref Range Status   Specimen Description RIGHT ANTECUBITAL  Final   Special Requests BOTTLES DRAWN AEROBIC AND ANAEROBIC 5CC  Final   Culture   Final    NO GROWTH 3 DAYS Performed at [facility], [place] [address] [place], [place] [address]    Report Status PENDING  Incomplete  Urine culture     Status: Abnormal   Collection Time: 09/11/16 12:39 AM  Result Value Ref Range Status   Specimen Description URINE, RANDOM  Final   Special Requests NONE  Final   Culture 40,000 COLONIES/mL ESCHERICHIA COLI (A)  Final   Report Status 09/13/2016 FINAL  Final   Organism ID, Bacteria ESCHERICHIA COLI (A)  Final      Susceptibility   Escherichia coli - MIC*    AMPICILLIN >=32 RESISTANT Resistant     CEFAZOLIN <=4 SENSITIVE Sensitive     CEFTRIAXONE <=1 SENSITIVE Sensitive     CIPROFLOXACIN >=4 RESISTANT Resistant     GENTAMICIN <=1 SENSITIVE Sensitive     IMIPENEM <=0.25 SENSITIVE Sensitive     NITROFURANTOIN <=16 SENSITIVE Sensitive     TRIMETH/SULFA <=20 SENSITIVE Sensitive     AMPICILLIN/SULBACTAM >=32 RESISTANT Resistant     PIP/TAZO <=4 SENSITIVE Sensitive     Extended ESBL NEGATIVE Sensitive     * 40,000 COLONIES/mL ESCHERICHIA COLI  MRSA PCR Screening     Status: Abnormal   Collection Time: 09/11/16 12:58 PM  Result Value Ref Range Status   MRSA by PCR POSITIVE (A) NEGATIVE Final    Comment:        The GeneXpert MRSA Assay (FDA approved for NASAL specimens only), is one component of a comprehensive MRSA colonization surveillance program. It is not intended to diagnose MRSA infection nor to guide or monitor treatment for MRSA  infections. RESULT CALLED TO, READ BACK BY AND VERIFIED WITH: F.AKANBE RN AT 2353 ON 09/11/16 BY S.VANHOORNE   Body fluid culture     Status: None  (Preliminary result)   Collection Time: 09/12/16  7:46 PM  Result Value Ref Range Status   Specimen Description SYNOVIAL RIGHT KNEE  Final   Special Requests Immunocompromised  Final   Gram Stain   Final    ABUNDANT WBC PRESENT, PREDOMINANTLY PMN NO ORGANISMS SEEN Gram Stain Report Called to,Read Back By and Verified With: Dondra Spry RN 2054 09/12/16 A NAVARRO    Culture   Final    NO GROWTH 1 DAY Performed at [facility], [place] [address] [place],  [address]    Report Status PENDING  Incomplete         Radiology Studies: No results found.      Scheduled Meds: . sodium chloride   Intravenous Once  . allopurinol  300 mg Oral Daily  . atorvastatin  20 mg Oral q1800  . carvedilol  12.5 mg Oral BID WC  . Chlorhexidine Gluconate Cloth  6 each Topical Q0600  . feeding supplement  1 Container Oral Q24H  . feeding supplement (ENSURE ENLIVE)  237 mL Oral BID PC  . fluconazole  100 mg Oral Daily  . multivitamin with minerals  1 tablet Oral Daily  . mupirocin ointment  1 application Nasal BID  . pantoprazole (PROTONIX) IV  40 mg Intravenous Q12H  . piperacillin-tazobactam (ZOSYN)  IV  3.375 g Intravenous Q8H  . sodium chloride flush  3 mL Intravenous Q12H   Continuous Infusions: . sodium chloride    . dextrose 75 mL/hr at 09/14/16 0800     LOS: 4 days    Time spent: 35 minutes.     Elmarie Shiley, MD [facility] Pager [phone]  If 7PM-7AM, please contact night-coverage [email or web address] Password [facility] 09/14/2016, 11:43 AM

## 2016-09-14 NOTE — Progress Notes (Signed)
CRITICAL VALUE ALERT  Critical value received:  Platlet count 20  Date of notification:  09/14/2016  Time of notification:  1202  Critical value read back:Yes.    Nurse who received alert:  Jeannie Fend RN  MD notified (1st page):  Dr Tyrell Antonio  Time of first page:  1215  MD notified (2nd page):  Time of second page:  Responding MD:  Dr. Tyrell Antonio  Time MD responded:  Text paged

## 2016-09-14 NOTE — Progress Notes (Signed)
Subjective: 2 Days Post-Op Procedure(s) (LRB): ESOPHAGOGASTRODUODENOSCOPY (EGD) WITH PROPOFOL (N/A) Patient reports pain as 3 on 0-10 scale. Chronic pain in Right Knee. Knee aspirate - Pending. He certainly is no candidate for any major knee surgery at this time. His Platelets are extremely low as has been noted. He informed me that his MD told him that he his"incurable Stomach cancer"!!There is no drainage from his previous Abscess site and no Major knee effusion.   Objective: Vital signs in last 24 hours: Temp:  [97.2 F (36.2 C)-99 F (37.2 C)] 97.8 F (36.6 C) (03/17 0309) Pulse Rate:  [72-85] 85 (03/17 0400) Resp:  [14-27] 24 (03/17 0400) BP: (126-157)/(43-106) 142/55 (03/17 0400) SpO2:  [97 %-100 %] 100 % (03/17 0400) Weight:  [79.5 kg (175 lb 4.3 oz)] 79.5 kg (175 lb 4.3 oz) (03/17 0500)  Intake/Output from previous day: 03/16 0701 - 03/17 0700 In: 2638 [I.V.:1725; Blood:713; IV Piggyback:200] Out: 1950 [Urine:1950] Intake/Output this shift: No intake/output data recorded.   Recent Labs  09/12/16 0335 09/12/16 1542 09/12/16 2128 09/13/16 0350 09/13/16 2032  HGB 7.9* 8.1* 7.5* 7.7* 8.7*    Recent Labs  09/12/16 1542  09/13/16 0350 09/13/16 2032  WBC 1.8*  --  1.5*  --   RBC 2.88*  --  2.69*  --   HCT 23.6*  < > 22.7* 25.8*  PLT 50*  --  35*  --   < > = values in this interval not displayed.  Recent Labs  09/12/16 0335 09/13/16 0350  NA 146* 141  K 3.1* 3.4*  CL 118* 116*  CO2 21* 19*  BUN 50* 39*  CREATININE 1.79* 1.45*  GLUCOSE 100* 149*  CALCIUM 8.3* 7.9*   No results for input(s): LABPT, INR in the last 72 hours.  No cellulitis present  Assessment/Plan: 2 Days Post-Op Procedure(s) (LRB): ESOPHAGOGASTRODUODENOSCOPY (EGD) WITH PROPOFOL (N/A) Up with therapy  Lupita Rosales A 09/14/2016, 7:46 AM

## 2016-09-15 ENCOUNTER — Inpatient Hospital Stay (HOSPITAL_COMMUNITY): Payer: Medicare Other

## 2016-09-15 DIAGNOSIS — C169 Malignant neoplasm of stomach, unspecified: Secondary | ICD-10-CM

## 2016-09-15 DIAGNOSIS — N19 Unspecified kidney failure: Secondary | ICD-10-CM

## 2016-09-15 DIAGNOSIS — Z515 Encounter for palliative care: Secondary | ICD-10-CM

## 2016-09-15 DIAGNOSIS — D649 Anemia, unspecified: Secondary | ICD-10-CM

## 2016-09-15 DIAGNOSIS — D696 Thrombocytopenia, unspecified: Secondary | ICD-10-CM

## 2016-09-15 LAB — BASIC METABOLIC PANEL
ANION GAP: 3 — AB (ref 5–15)
BUN: 30 mg/dL — AB (ref 6–20)
CHLORIDE: 113 mmol/L — AB (ref 101–111)
CO2: 24 mmol/L (ref 22–32)
Calcium: 7.8 mg/dL — ABNORMAL LOW (ref 8.9–10.3)
Creatinine, Ser: 1.01 mg/dL (ref 0.61–1.24)
GFR calc Af Amer: >60 mL/min (ref >60)
GFR calc non Af Amer: >60 mL/min (ref >60)
Glucose, Bld: 96 mg/dL (ref 65–99)
Potassium: 3.2 mmol/L — ABNORMAL LOW (ref 3.5–5.1)
SODIUM: 140 mmol/L (ref 135–145)

## 2016-09-15 LAB — CBC
HCT: 21 % — ABNORMAL LOW (ref 39.0–52.0)
HEMOGLOBIN: 7.1 g/dL — AB (ref 13.0–17.0)
MCH: 27.8 pg (ref 26.0–34.0)
MCHC: 33.8 g/dL (ref 30.0–36.0)
MCV: 82.4 fL (ref 78.0–100.0)
Platelets: 21 10*3/uL — CL (ref 150–400)
RBC: 2.55 MIL/uL — AB (ref 4.22–5.81)
RDW: 18.6 % — ABNORMAL HIGH (ref 11.5–15.5)
WBC: 1.4 10*3/uL — CL (ref 4.0–10.5)

## 2016-09-15 LAB — CULTURE, BLOOD (ROUTINE X 2)
CULTURE: NO GROWTH
CULTURE: NO GROWTH

## 2016-09-15 LAB — GLUCOSE, CAPILLARY
GLUCOSE-CAPILLARY: 119 mg/dL — AB (ref 65–99)
GLUCOSE-CAPILLARY: 152 mg/dL — AB (ref 65–99)
GLUCOSE-CAPILLARY: 149 mg/dL — AB (ref 65–99)

## 2016-09-15 LAB — PREPARE RBC (CROSSMATCH)

## 2016-09-15 MED ORDER — FUROSEMIDE 10 MG/ML IJ SOLN
20.0000 mg | Freq: Once | INTRAMUSCULAR | Status: AC
  Administered 2016-09-15: 20 mg via INTRAVENOUS
  Filled 2016-09-15: qty 2

## 2016-09-15 MED ORDER — IOPAMIDOL (ISOVUE-300) INJECTION 61%
15.0000 mL | Freq: Once | INTRAVENOUS | Status: DC | PRN
  Administered 2016-09-15: 15 mL via ORAL
  Filled 2016-09-15: qty 30

## 2016-09-15 MED ORDER — IOPAMIDOL (ISOVUE-300) INJECTION 61%
INTRAVENOUS | Status: AC
  Administered 2016-09-15: 100 mL
  Filled 2016-09-15: qty 100

## 2016-09-15 MED ORDER — SODIUM CHLORIDE 0.9 % IV SOLN
Freq: Once | INTRAVENOUS | Status: AC
  Administered 2016-09-15: 18:00:00 via INTRAVENOUS

## 2016-09-15 MED ORDER — POTASSIUM CHLORIDE CRYS ER 20 MEQ PO TBCR
40.0000 meq | EXTENDED_RELEASE_TABLET | Freq: Once | ORAL | Status: AC
  Administered 2016-09-15: 40 meq via ORAL
  Filled 2016-09-15: qty 2

## 2016-09-15 MED ORDER — FUROSEMIDE 10 MG/ML IJ SOLN
INTRAMUSCULAR | Status: AC
  Administered 2016-09-15: 20 mg via INTRAVENOUS
  Filled 2016-09-15: qty 2

## 2016-09-15 MED ORDER — SODIUM CHLORIDE 0.9 % IV SOLN
Freq: Once | INTRAVENOUS | Status: AC
  Administered 2016-09-15: 11:00:00 via INTRAVENOUS

## 2016-09-15 NOTE — Progress Notes (Signed)
HEMATOLOGY-ONCOLOGY PROGRESS NOTE  SUBJECTIVE: Newly diagnosed gastric cancer with a prior history of myelodysplastic syndrome Patient present to the hospital with melena and severe anemia. He had an upper endoscopy that revealed gastric and duodenal ulcers. Biopsy of the ulcers came back as adenocarcinoma of the stomach. I was asked to see him to discuss treatment options for this elderly gentleman with severe pancytopenia from MDS. Patient denies any abdominal pain. He has been losing weight about 30-40 pounds in the last year. He has poor appetite. She has not been ambulatory well he is being here. He requires assistance even to sit in the chair.  OBJECTIVE: REVIEW OF SYSTEMS:   Constitutional: Denies fevers, chills or abnormal weight loss Eyes: Denies blurriness of vision Ears, nose, mouth, throat, and face: Denies mucositis or sore throat Respiratory: Denies cough, dyspnea or wheezes Cardiovascular: Occasional palpitations Gastrointestinal:  Melena and hematochezia, epistaxis Skin: Bruises Neurological:Denies numbness, tingling or new weaknesses Behavioral/Psych: Anxious the findings of new gastric cancer Extremities: No lower extremity edema All other systems were reviewed with the patient and are negative.  I have reviewed the past medical history, past surgical history, social history and family history with the patient and they are unchanged from previous note.   PHYSICAL EXAMINATION: ECOG PERFORMANCE STATUS: 3 - Symptomatic, >50% confined to bed  Vitals:   09/15/16 0003 09/15/16 0400  BP: (!) 120/50 (!) 120/36  Pulse: 75 79  Resp: (!) 22 (!) 24  Temp: 98.7 F (37.1 C)    Filed Weights   09/13/16 0500 09/14/16 0500 09/15/16 0500  Weight: 172 lb 9.9 oz (78.3 kg) 175 lb 4.3 oz (79.5 kg) 174 lb 9.7 oz (79.2 kg)    GENERAL:alert, no distress and comfortable SKIN: skin color, texture, turgor are normal, no rashes or significant lesions EYES: normal, Conjunctiva are  pink and non-injected, sclera clear OROPHARYNX:no exudate, no erythema and lips, buccal mucosa, and tongue normal  NECK: supple, thyroid normal size, non-tender, without nodularity LYMPH:  no palpable lymphadenopathy in the cervical, axillary or inguinal LUNGS: clear to auscultation and percussion with normal breathing effort HEART: regular rate & rhythm and Soft ejection systolic murmur in the mitral area ABDOMEN:No hepatosplenomegaly Musculoskeletal:no cyanosis of digits and no clubbing  NEURO: alert & oriented x 3 with fluent speech, no focal motor/sensory deficits  LABORATORY DATA:  I have reviewed the data as listed CMP Latest Ref Rng & Units 09/14/2016 09/13/2016 09/12/2016  Glucose 65 - 99 mg/dL 162(H) 149(H) 100(H)  BUN 6 - 20 mg/dL 31(H) 39(H) 50(H)  Creatinine 0.61 - 1.24 mg/dL 1.15 1.45(H) 1.79(H)  Sodium 135 - 145 mmol/L 137 141 146(H)  Potassium 3.5 - 5.1 mmol/L 3.5 3.4(L) 3.1(L)  Chloride 101 - 111 mmol/L 112(H) 116(H) 118(H)  CO2 22 - 32 mmol/L 23 19(L) 21(L)  Calcium 8.9 - 10.3 mg/dL 7.5(L) 7.9(L) 8.3(L)  Total Protein 6.5 - 8.1 g/dL - - -  Total Bilirubin 0.3 - 1.2 mg/dL - - -  Alkaline Phos 38 - 126 U/L - - -  AST 15 - 41 U/L - - -  ALT 17 - 63 U/L - - -    Lab Results  Component Value Date   WBC 1.6 (L) 09/14/2016   HGB 8.1 (L) 09/14/2016   HCT 24.1 (L) 09/14/2016   MCV 82.8 09/14/2016   PLT 20 (LL) 09/14/2016   NEUTROABS 1.5 (L) 09/12/2016    ASSESSMENT AND PLAN: 1. Newly diagnosed gastric cancer: Adenocarcinoma. I discussed with him that we will need  to do a CT scan for staging evaluation. CT chest abdomen pelvis with contrast will be obtained. I would like to hydrate him before and after his scan. His creatinine has just come down to 1.15. We will have to watch her very closely for contrast nephropathy. If he does not have metastatic disease, surgical options will need to be explored. I discussed with him that performance status is very important  prognostic finding and that he needs to put in an effort to get to the chair and tried to walk with a walker.  2. severe anemia and thrombocytopenia thought to be related to myelodysplastic syndrome: Goal is to keep hemoglobin more than 8 3. Renal failure: Will be monitored Patient's daughter was in the room and I discussed the plan with her and she is in agreement. I asked the patient what his wishes are given this new finding. He discussed with me that he would be open to treatments if it could cure his cancer.  Dr. Alvy Bimler will see him tomorrow

## 2016-09-15 NOTE — Progress Notes (Signed)
PROGRESS NOTE    [name]  JOA:[id number] DOB: [birth year] DOA: 09/10/2016 PCP: Melinda Crutch, MD   Brief Narrative: [name] is a 83 y.o. male with medical history significant for hypertension, coronary artery disease status post CABG, chronic diastolic CHF, MDS, and recent admission for acute blood loss anemia attributed to bleeding esophageal ulcers, now presenting to the emergency department with 3 weeks of progressive generalized weakness and one day of fevers. Patient was discharged from the hospital on 08/16/2016 after management for acute blood loss anemia attributed to bleeding esophageal ulcers. He reports being in much improved condition upon discharge and felt well at home until approximately 3 weeks ago when he noted the insidious development of generalized weakness. He reports continued melanotic stools since the hospital discharge, but denies abdominal pain or nausea   Assessment & Plan:   Principal Problem:   Symptomatic anemia Active Problems:   CAD (coronary artery disease)   HTN (hypertension)   MDS (myelodysplastic syndrome), low grade (HCC)   Pancytopenia (HCC)   Ulcer of esophagus with bleeding   Severe sepsis (HCC)   Chronic diastolic CHF (congestive heart failure) (HCC)   AKI (acute kidney injury) ([place])   Adenocarcinoma of unknown origin ([place])  1-Acute Blood loss anemia, GI bleed; Gastric Ulcer and Duodenal Ulcer; Adenocarcinoma -Pt presents with melena, FOBT+ stool, Hgb 3.9 (8.0 on 08/16/16), BUN 70 -EGD (08/12/16) with bleeding esophageal ulcers and findings concerning for severe candidal esophagitis; he has been taking Diflucan and Protonix BID for past month at home. -Continue with IV protonix. Diflucan.  -He has  received 6 units PRBC during this admission. He will received one unit PRBC today 3-17 -3 units of platelets, counting one unit today  -GI consulted, Patient underwent endoscopy 3-15, which showed Gastric Ulcer and Duodenal Ulcer. Pathology came  back with adenocarcinoma.  -oncology recommending to keep hb above 8.  -biopsy came back with adenocarcinoma. Dr Lindi Adie, order CT abdomen , chest for staging.   2-Pancytopenia;   Has history MDS.  Worsening count in setting of infection or bleeding.  Transfusion as needed.  IV antibiotics.  Appreciate Dr Alvy Bimler evaluation. Plan to continue with support care.   Will transfuse platelet and RBC today   3-Sepsis;   Presents with fever 102 F , leukopenia to 2.9k, elevated lactate, and hypotension  He report dysuria. Continue with IV antibiotics in setting leukopenia.  Urine culture growing 40,000 ecoli, sensitive to ceftriaxone.  Blood culture no growth to date.  Lactic acid has decreased to 2 from 4.  Stop vanc, continue with zosyn.   4-Right knee with boil. Prior knee replacement.  X ray with possible effusion/  I called PCP office culture from boil grew : light growth of streptococcus anginosis.  I have consulted Dr Cay Schillings.  Patient may have sinus tract on his right knee. Follow culture results from aspiration from 3-15. He may need open debridement and poly exchange. This on hold due to new diagnosis of cancer.  Continue with zosyn. Will ask Dr Donnamae Jude plan for antibiotics.  Culture no growth.   AKI; SCr is 2.49 on admission, up from 1.13 one month prior In setting of hypovolemia, anemia, infection.  NSL fluids.  Improving.   Chronic diastolic CHF  Hold diuretics.   Hypertension  Hold for now nifedipine, and Cardura due to hypotension, sepsis, GI bleed.  Resume coreg.    CAD - Status-post CABG  - resume coreg.   Hypokalemia; replete orally.    DVT prophylaxis:  SCD. No anticoagulation in setting of GI bleed.  Code Status: Full code.  Family Communication: daughter updated 74-17. Palliative care consulted.  Disposition Plan:  Remain in the step down unit.   Consultants:   GI  Procedures: None  Antimicrobials:  Vancomycin 3-13 Zosyn 3-13  Subjective: He  is feeling ok, denies pain. He has been eating.     Objective: Vitals:   09/15/16 0716 09/15/16 0800 09/15/16 1200 09/15/16 1600  BP:  (!) 125/49 (!) 119/39 (!) 142/55  Pulse:  82 79 79  Resp:  18 (!) 21 (!) 24  Temp: 98.1 F (36.7 C)  98.7 F (37.1 C)   TempSrc: Oral  Oral   SpO2:  100% 93% 98%  Weight:      Height:        Intake/Output Summary (Last 24 hours) at 09/15/16 1709 Last data filed at 09/15/16 1200  Gross per 24 hour  Intake              853 ml  Output             1600 ml  Net             -747 ml   Filed Weights   09/13/16 0500 09/14/16 0500 09/15/16 0500  Weight: 78.3 kg (172 lb 9.9 oz) 79.5 kg (175 lb 4.3 oz) 79.2 kg (174 lb 9.7 oz)    Examination:  General exam: Appears calm and comfortable  Respiratory system: Clear to auscultation. Respiratory effort normal. Cardiovascular system: S1 & S2 heard, RRR. No JVD, murmurs, rubs, gallops or clicks. No pedal edema. Gastrointestinal system: Abdomen is nondistended, soft and nontender. No organomegaly or masses felt. Normal bowel sounds heard. Central nervous system: Alert and oriented. No focal neurological deficits. Extremities: Symmetric 5 x 5 power. Skin: small nodule right knee, no drainage,. No redness.      Data Reviewed: I have personally reviewed following labs and imaging studies  CBC:  Recent Labs Lab 09/10/16 1600  09/11/16 0537  09/12/16 0335 09/12/16 1542 09/12/16 2128 09/13/16 0350 09/13/16 2032 09/14/16 1045 09/15/16 0829  WBC 2.0*  --  1.7*  < > 1.9* 1.8*  --  1.5*  --  1.6* 1.4*  NEUTROABS 1.6*  --  1.4*  --  1.5*  --   --   --   --   --   --   HGB 3.9*  < > 5.6*  < > 7.9* 8.1* 7.5* 7.7* 8.7* 8.1* 7.1*  HCT 12.2*  < > 16.6*  < > 22.8* 23.6* 21.8* 22.7* 25.8* 24.1* 21.0*  MCV 82.4  --  84.7  < > 82.0 81.9  --  84.4  --  82.8 82.4  PLT 36*  --  30*  < > 33* 50*  --  35*  --  20* 21*  < > = values in this interval not displayed. Basic Metabolic Panel:  Recent Labs Lab  09/11/16 0537 09/12/16 0335 09/13/16 0350 09/14/16 1045 09/15/16 0829  NA 139 146* 141 137 140  K 3.3* 3.1* 3.4* 3.5 3.2*  CL 113* 118* 116* 112* 113*  CO2 17* 21* 19* 23 24  GLUCOSE 111* 100* 149* 162* 96  BUN 61* 50* 39* 31* 30*  CREATININE 2.17* 1.79* 1.45* 1.15 1.01  CALCIUM 8.0* 8.3* 7.9* 7.5* 7.8*   GFR: Estimated Creatinine Clearance: 65.3 mL/min (by C-G formula based on SCr of 1.01 mg/dL). Liver Function Tests:  Recent Labs Lab 09/10/16 1600  AST 34  ALT 20  ALKPHOS 46  BILITOT 0.6  PROT 5.6*  ALBUMIN 1.9*   No results for input(s): LIPASE, AMYLASE in the last 168 hours. No results for input(s): AMMONIA in the last 168 hours. Coagulation Profile: No results for input(s): INR, PROTIME in the last 168 hours. Cardiac Enzymes: No results for input(s): CKTOTAL, CKMB, CKMBINDEX, TROPONINI in the last 168 hours. BNP (last 3 results) No results for input(s): PROBNP in the last 8760 hours. HbA1C: No results for input(s): HGBA1C in the last 72 hours. CBG:  Recent Labs Lab 09/13/16 1707 09/14/16 0751 09/14/16 1201 09/15/16 0929 09/15/16 1130  GLUCAP 143* 130* 150* 119* 152*   Lipid Profile: No results for input(s): CHOL, HDL, LDLCALC, TRIG, CHOLHDL, LDLDIRECT in the last 72 hours. Thyroid Function Tests: No results for input(s): TSH, T4TOTAL, FREET4, T3FREE, THYROIDAB in the last 72 hours. Anemia Panel: No results for input(s): VITAMINB12, FOLATE, FERRITIN, TIBC, IRON, RETICCTPCT in the last 72 hours. Sepsis Labs:  Recent Labs Lab 09/10/16 1602 09/10/16 1911 09/11/16 0241  LATICACIDVEN 4.83* 3.27* 2.0*    Recent Results (from the past 240 hour(s))  Culture, blood (routine x 2)     Status: None   Collection Time: 09/10/16  3:11 PM  Result Value Ref Range Status   Specimen Description BLOOD RIGHT HAND  Final   Special Requests BOTTLES DRAWN AEROBIC ONLY 5CC  Final   Culture   Final    NO GROWTH 5 DAYS Performed at [facility], [place]  [address], [place], [place] [address]    Report Status 09/15/2016 FINAL  Final  Culture, blood (routine x 2)     Status: None   Collection Time: 09/10/16  4:10 PM  Result Value Ref Range Status   Specimen Description RIGHT ANTECUBITAL  Final   Special Requests BOTTLES DRAWN AEROBIC AND ANAEROBIC 5CC  Final   Culture   Final    NO GROWTH 5 DAYS Performed at [facility], [place] [address] [place], [place] [address]    Report Status 09/15/2016 FINAL  Final  Urine culture     Status: Abnormal   Collection Time: 09/11/16 12:39 AM  Result Value Ref Range Status   Specimen Description URINE, RANDOM  Final   Special Requests NONE  Final   Culture 40,000 COLONIES/mL ESCHERICHIA COLI (A)  Final   Report Status 09/13/2016 FINAL  Final   Organism ID, Bacteria ESCHERICHIA COLI (A)  Final      Susceptibility   Escherichia coli - MIC*    AMPICILLIN >=32 RESISTANT Resistant     CEFAZOLIN <=4 SENSITIVE Sensitive     CEFTRIAXONE <=1 SENSITIVE Sensitive     CIPROFLOXACIN >=4 RESISTANT Resistant     GENTAMICIN <=1 SENSITIVE Sensitive     IMIPENEM <=0.25 SENSITIVE Sensitive     NITROFURANTOIN <=16 SENSITIVE Sensitive     TRIMETH/SULFA <=20 SENSITIVE Sensitive     AMPICILLIN/SULBACTAM >=32 RESISTANT Resistant     PIP/TAZO <=4 SENSITIVE Sensitive     Extended ESBL NEGATIVE Sensitive     * 40,000 COLONIES/mL ESCHERICHIA COLI  MRSA PCR Screening     Status: Abnormal   Collection Time: 09/11/16 12:58 PM  Result Value Ref Range Status   MRSA by PCR POSITIVE (A) NEGATIVE Final    Comment:        The GeneXpert MRSA Assay (FDA approved for NASAL specimens only), is one component of a comprehensive MRSA colonization surveillance program. It is not intended to diagnose MRSA infection nor  to guide or monitor treatment for MRSA infections. RESULT CALLED TO, READ BACK BY AND VERIFIED WITH: F.AKANBE RN AT 5102 ON 09/11/16 BY S.VANHOORNE   Body fluid culture     Status: None (Preliminary result)    Collection Time: 09/12/16  7:46 PM  Result Value Ref Range Status   Specimen Description SYNOVIAL RIGHT KNEE  Final   Special Requests Immunocompromised  Final   Gram Stain   Final    ABUNDANT WBC PRESENT, PREDOMINANTLY PMN NO ORGANISMS SEEN Gram Stain Report Called to,Read Back By and Verified With: Dondra Spry RN 2054 09/12/16 A NAVARRO    Culture   Final    NO GROWTH 2 DAYS Performed at [facility], [place] [address], [place], [place] [address]    Report Status PENDING  Incomplete  Anaerobic culture     Status: None (Preliminary result)   Collection Time: 09/12/16  7:46 PM  Result Value Ref Range Status   Specimen Description SYNOVIAL RIGHT KNEE  Final   Special Requests Immunocompromised  Final   Culture   Final    NO ANAEROBES ISOLATED; CULTURE IN PROGRESS FOR 5 DAYS   Report Status PENDING  Incomplete         Radiology Studies: Ct Chest W Contrast  Result Date: 09/15/2016 CLINICAL DATA:  Staging workup for gastric cancer. Upper endoscopy on 09/13/2016 showed an anterior gastric wall ulcer and a duodenal ulcer. Both ulcers revealed adenocarcinoma. EXAM: CT CHEST, ABDOMEN, AND PELVIS WITH CONTRAST TECHNIQUE: Multidetector CT imaging of the chest, abdomen and pelvis was performed following the standard protocol during bolus administration of intravenous contrast. CONTRAST:  128mL ISOVUE-300 IOPAMIDOL (ISOVUE-300) INJECTION 61% COMPARISON:  Multiple exams, including CT chest 11/27/2008 FINDINGS: CT CHEST FINDINGS Cardiovascular: Coronary, aortic arch, and branch vessel atherosclerotic vascular disease. Mild cardiomegaly. Prior median sternotomy. Mediastinum/Nodes: Right lower paratracheal node 1.9 cm in short axis on image 20/2. Chronic prevascular and AP window lymph nodes are borderline enlarged. Right hilar node 1.6 cm in short axis on image 26/2. Suspected left infrahilar adenopathy. Lungs/Pleura: Small bilateral pleural effusions without well-defined pleural mass. These effusions  are nonspecific for exudative versus transudative etiology. Volume loss in the left lower lobe. Possible left infrahilar mass on image 29/2, 4.3 by 2.1 cm, although significant portions of this may be due to atelectasis. The superior segmental bronchus is occluded in this vicinity. Secondary pulmonary lobular interstitial accentuation is observed. New 5 by 3 mm apical segment right upper lobe pulmonary nodule, image 17/7. Other scattered small new pulmonary nodules are present. Despite efforts by the technologist and patient, motion artifact is present on today's exam and could not be eliminated. This reduces exam sensitivity and specificity. Chronic medial right lower lobe density could be due it to atelectasis but is technically nonspecific. Musculoskeletal: Unremarkable CT ABDOMEN PELVIS FINDINGS Hepatobiliary: Initial imaging is in the arterial phase of contrast, probably due to a reduced cardiac output. This may reduce sensitivity in assessing the solid organs. Indistinctly marginated 1.8 cm hypodense lesion in segment 7 of the liver on image 55/2, concerning for possible metastatic lesion. Several other speckled hypodensities are present in the liver but technically nonspecific due to small size, motion artifact, and early contrast phase. Multiple gallstones filling the somewhat contracted gallbladder. No biliary dilatation. Pancreas: Unremarkable Spleen: Old granulomatous disease. Adrenals/Urinary Tract: 5.7 by 4.9 cm right adrenal mass, date arterial phase density 47 Hounsfield units, delayed phase density 46 Hounsfield units, technically nonspecific. This level was not included on prior chest  CTs and accordingly I do not know if this is a chronic or more recent development. Renal cortical atrophy. Small left renal hypodense lesions are statistically likely to be small cysts but technically nonspecific due to small size. Urinary bladder unremarkable. Stomach/Bowel: The recently biopsied gastric and duodenal  ulcers are surprisingly in apparent on today's CT scan. There is a 6 cm segment of abnormal wall thickening in the ascending colon which persists on the delayed images, an which has adjacent varicoid nodularity suspicious for local tumor deposit along the adjacent right paracolic gutter for example on image 28/4. On image 60/5 there is abnormal wall thickening in a 3.0 cm segment of small bowel in the right lower quadrant, with contrast in the adjacent lumen. On image 76/2 there is a 4.5 cm segment of wall thickening in the left-sided small bowel, with 2 adjacent small bowel mesentery lymph nodes measuring 1.5 and 1.3 cm in short axis on images 79 and 77 of series 2, respectively. Vascular/Lymphatic: Aortoiliac atherosclerotic vascular disease. An aortocaval lymph node has a short axis diameter of 1.6 cm on image 73/2. Reproductive: Enlarged prostate gland measures 6.8 by 4.6 by 7.0 cm (volume = 110 cm^3) and indents the bladder base. Other: Right upper quadrant omental nodule measures 9 mm in short axis on image 62/2. Bilateral subcutaneous edema along the flanks. Trace perihepatic ascites. Musculoskeletal: Stranding along the right hip adductor musculature, cause uncertain. Lumbar spondylosis, scoliosis, and degenerative disc disease causing impingement at all lumbar levels. IMPRESSION: 1. The recently diagnosed sites of adenocarcinoma in the stomach and duodenum are not well seen, but there are a variety of other concerning findings for malignancy including a hypodense liver lesion in the right hepatic lobe suspicious for metastatic focus; a few scattered small pulmonary nodules which were not present in 2010; a 5.7 cm otherwise nonspecific right adrenal mass ; a 6 cm focal segment of abnormal wall thickening in the ascending colon with adjacent localized adenopathy ; a 4.5 segment of abnormal small bowel wall thickening in the left lower quadrant with adjacent adenopathy ; and a 3 cm segment of small bowel wall  thickening in the right lower quadrant. There is also a pathologically enlarged aortocaval lymph node, as well as enlarged right paratracheal and right hilar lymph nodes. Possible left infrahilar mass with occlusion of the superior segment left lower lobe bronchus. Appearance concerning for somewhat widespread metastatic disease, nuclear medicine PET-CT may be helpful in confirmation of these findings in assessing for other lesions. 2. Small omental nodule in the right upper quadrant, potentially a metastatic focus. 3. Small bilateral pleural effusions without well-defined pleural mass. There is cardiomegaly and secondary pulmonary lobular interstitial accentuation which may reflect interstitial edema. 4. Multiple gallstones fill the somewhat contracted gallbladder. 5. Coronary, aortic arch, and branch vessel atherosclerotic vascular disease. Aortoiliac atherosclerotic vascular disease. 6. Enlarged prostate gland, volume 110 cc. 7. Trace perihepatic ascites. 8. Stranding along the right hip adductor musculature, cause uncertain. 9. Lumbar spondylosis, scoliosis, and degenerative disc disease causing impingement at all lumbar levels. Electronically Signed   By: Van Clines M.D.   On: 09/15/2016 14:09   Ct Abdomen Pelvis W Contrast  Result Date: 09/15/2016 CLINICAL DATA:  Staging workup for gastric cancer. Upper endoscopy on 09/13/2016 showed an anterior gastric wall ulcer and a duodenal ulcer. Both ulcers revealed adenocarcinoma. EXAM: CT CHEST, ABDOMEN, AND PELVIS WITH CONTRAST TECHNIQUE: Multidetector CT imaging of the chest, abdomen and pelvis was performed following the standard protocol during bolus administration of  intravenous contrast. CONTRAST:  198mL ISOVUE-300 IOPAMIDOL (ISOVUE-300) INJECTION 61% COMPARISON:  Multiple exams, including CT chest 11/27/2008 FINDINGS: CT CHEST FINDINGS Cardiovascular: Coronary, aortic arch, and branch vessel atherosclerotic vascular disease. Mild cardiomegaly. Prior  median sternotomy. Mediastinum/Nodes: Right lower paratracheal node 1.9 cm in short axis on image 20/2. Chronic prevascular and AP window lymph nodes are borderline enlarged. Right hilar node 1.6 cm in short axis on image 26/2. Suspected left infrahilar adenopathy. Lungs/Pleura: Small bilateral pleural effusions without well-defined pleural mass. These effusions are nonspecific for exudative versus transudative etiology. Volume loss in the left lower lobe. Possible left infrahilar mass on image 29/2, 4.3 by 2.1 cm, although significant portions of this may be due to atelectasis. The superior segmental bronchus is occluded in this vicinity. Secondary pulmonary lobular interstitial accentuation is observed. New 5 by 3 mm apical segment right upper lobe pulmonary nodule, image 17/7. Other scattered small new pulmonary nodules are present. Despite efforts by the technologist and patient, motion artifact is present on today's exam and could not be eliminated. This reduces exam sensitivity and specificity. Chronic medial right lower lobe density could be due it to atelectasis but is technically nonspecific. Musculoskeletal: Unremarkable CT ABDOMEN PELVIS FINDINGS Hepatobiliary: Initial imaging is in the arterial phase of contrast, probably due to a reduced cardiac output. This may reduce sensitivity in assessing the solid organs. Indistinctly marginated 1.8 cm hypodense lesion in segment 7 of the liver on image 55/2, concerning for possible metastatic lesion. Several other speckled hypodensities are present in the liver but technically nonspecific due to small size, motion artifact, and early contrast phase. Multiple gallstones filling the somewhat contracted gallbladder. No biliary dilatation. Pancreas: Unremarkable Spleen: Old granulomatous disease. Adrenals/Urinary Tract: 5.7 by 4.9 cm right adrenal mass, date arterial phase density 47 Hounsfield units, delayed phase density 46 Hounsfield units, technically  nonspecific. This level was not included on prior chest CTs and accordingly I do not know if this is a chronic or more recent development. Renal cortical atrophy. Small left renal hypodense lesions are statistically likely to be small cysts but technically nonspecific due to small size. Urinary bladder unremarkable. Stomach/Bowel: The recently biopsied gastric and duodenal ulcers are surprisingly in apparent on today's CT scan. There is a 6 cm segment of abnormal wall thickening in the ascending colon which persists on the delayed images, an which has adjacent varicoid nodularity suspicious for local tumor deposit along the adjacent right paracolic gutter for example on image 28/4. On image 60/5 there is abnormal wall thickening in a 3.0 cm segment of small bowel in the right lower quadrant, with contrast in the adjacent lumen. On image 76/2 there is a 4.5 cm segment of wall thickening in the left-sided small bowel, with 2 adjacent small bowel mesentery lymph nodes measuring 1.5 and 1.3 cm in short axis on images 79 and 77 of series 2, respectively. Vascular/Lymphatic: Aortoiliac atherosclerotic vascular disease. An aortocaval lymph node has a short axis diameter of 1.6 cm on image 73/2. Reproductive: Enlarged prostate gland measures 6.8 by 4.6 by 7.0 cm (volume = 110 cm^3) and indents the bladder base. Other: Right upper quadrant omental nodule measures 9 mm in short axis on image 62/2. Bilateral subcutaneous edema along the flanks. Trace perihepatic ascites. Musculoskeletal: Stranding along the right hip adductor musculature, cause uncertain. Lumbar spondylosis, scoliosis, and degenerative disc disease causing impingement at all lumbar levels. IMPRESSION: 1. The recently diagnosed sites of adenocarcinoma in the stomach and duodenum are not well seen, but there are a  variety of other concerning findings for malignancy including a hypodense liver lesion in the right hepatic lobe suspicious for metastatic focus; a  few scattered small pulmonary nodules which were not present in 2010; a 5.7 cm otherwise nonspecific right adrenal mass ; a 6 cm focal segment of abnormal wall thickening in the ascending colon with adjacent localized adenopathy ; a 4.5 segment of abnormal small bowel wall thickening in the left lower quadrant with adjacent adenopathy ; and a 3 cm segment of small bowel wall thickening in the right lower quadrant. There is also a pathologically enlarged aortocaval lymph node, as well as enlarged right paratracheal and right hilar lymph nodes. Possible left infrahilar mass with occlusion of the superior segment left lower lobe bronchus. Appearance concerning for somewhat widespread metastatic disease, nuclear medicine PET-CT may be helpful in confirmation of these findings in assessing for other lesions. 2. Small omental nodule in the right upper quadrant, potentially a metastatic focus. 3. Small bilateral pleural effusions without well-defined pleural mass. There is cardiomegaly and secondary pulmonary lobular interstitial accentuation which may reflect interstitial edema. 4. Multiple gallstones fill the somewhat contracted gallbladder. 5. Coronary, aortic arch, and branch vessel atherosclerotic vascular disease. Aortoiliac atherosclerotic vascular disease. 6. Enlarged prostate gland, volume 110 cc. 7. Trace perihepatic ascites. 8. Stranding along the right hip adductor musculature, cause uncertain. 9. Lumbar spondylosis, scoliosis, and degenerative disc disease causing impingement at all lumbar levels. Electronically Signed   By: Van Clines M.D.   On: 09/15/2016 14:09        Scheduled Meds: . sodium chloride   Intravenous Once  . sodium chloride   Intravenous Once  . allopurinol  300 mg Oral Daily  . atorvastatin  20 mg Oral q1800  . carvedilol  12.5 mg Oral BID WC  . Chlorhexidine Gluconate Cloth  6 each Topical Q0600  . feeding supplement  1 Container Oral Q24H  . feeding supplement (ENSURE  ENLIVE)  237 mL Oral BID PC  . fluconazole  100 mg Oral Daily  . furosemide  20 mg Intravenous Once  . multivitamin with minerals  1 tablet Oral Daily  . mupirocin ointment  1 application Nasal BID  . pantoprazole (PROTONIX) IV  40 mg Intravenous Q12H  . piperacillin-tazobactam (ZOSYN)  IV  3.375 g Intravenous Q8H  . potassium chloride  40 mEq Oral Once  . sodium chloride flush  3 mL Intravenous Q12H   Continuous Infusions: . sodium chloride 20 mL/hr at 09/15/16 0641     LOS: 5 days    Time spent: 35 minutes.     Elmarie Shiley, MD [facility] Pager [phone]  If 7PM-7AM, please contact night-coverage [email or web address] Password [email or web address] 09/15/2016, 5:09 PM

## 2016-09-15 NOTE — Consult Note (Signed)
Consultation Note Date: 09/15/2016   Patient Name: [name]  DOB: [birth year]  MRN: [id number]  Age / Sex: 83 y.o., male  PCP: Lawerance Cruel, MD Referring Physician: Elmarie Shiley, MD  Reason for Consultation: Establishing goals of care  HPI/Patient Profile: 83 y.o. male  with past medical history of hypertension, coronary disease status post CABG, heart failure, MDS, esophageal ulcers with recent admission for anemia admitted on 09/10/2016 with acute blood loss anemia, now with biopsy shown to be gastric cancer. Palliative consulted for goals of care  Clinical Assessment and Goals of Care: I met today with [name] and his daughter, [name].  I introduced palliative care as specialized medical care for people living with serious illness. It focuses on providing relief from the symptoms and stress of a serious illness. The goal is to improve quality of life for both the patient and the family.  He reports the most important things to him are feeling as well as he can for as long as he can, being out of the hospital, his family, and his faith.  He is married but his wife has dementia. He had 4 children, 2 now deceased. 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  He reports meeting with Dr. Sonny Dandy this morning.  Overall, he reports his physicians have been doing a good job explaining things to him. He reports needing to meet with Dr. Alvy Bimler prior to any further conversations.    SUMMARY OF RECOMMENDATIONS   - Met today with [name] and his daughter. - He has not completed [facility] POA paperwork, however, reports that his 2 living children would act as his surrogate decision makers. - He knows that biopsy results have returned and new diagnosis of gastric cancer.  Await results of CT scan for staging and discussion with Dr. Alvy Bimler.  He  reports needing to speak with her prior to any further discussion regarding [place]. - He is agreeable to palliative f/u tomorrow after he has had a chance to meet with Dr. Alvy Bimler.  Code Status/Advance Care Planning:  Full code  Palliative Prophylaxis:   Frequent Pain Assessment   Psycho-social/Spiritual:   Desire for further Chaplaincy support: Not addressed today  Prognosis:   Unable to determine  Discharge Planning: To Be Determined      Primary Diagnoses: Present on Admission: . Symptomatic anemia . Severe sepsis ([place]) . Ulcer of esophagus with bleeding . Pancytopenia ([place]) . MDS (myelodysplastic syndrome), low grade ([place]) . HTN (hypertension) . CAD (coronary artery disease) . Chronic diastolic CHF (congestive heart failure) ([place]) . AKI (acute kidney injury) ([place]) . Adenocarcinoma of unknown origin [facility])   I have reviewed the medical record, interviewed the patient and family, and examined the patient. The following aspects are pertinent.  Past Medical History:  Diagnosis Date  . Adenocarcinoma of unknown origin ([place]) 09/13/2016  . Anemia, chronic disease    Followed by Dr. Lamonte Sakai  . Back pain  01/25/2014  . Coronary artery disease    CABG x4  -- with Est. EF of 50-60%  --  07/06/2007  . Diabetes mellitus   . GI bleed   . Gout   . Hyperlipidemia   . Hypertension    since his Mid -- 20's  . MDS (myelodysplastic syndrome), low grade ([place])   . Mild aortic stenosis   . Osteoarthritis   . Renal insufficiency   . Thrombocytopenia [facility])    Social History   Social History  . Marital status: Married    Spouse name: N/A  . Number of children: N/A  . Years of education: N/A   Social History Main Topics  . Smoking status: Former Smoker    Quit date: 07/01/1980  . Smokeless tobacco: Never Used  . Alcohol use No  . Drug use: No  . Sexual activity: No   Other Topics Concern  . None   Social History Narrative  . None   Family History  Problem Relation Age of  Onset  . Cancer Daughter     colon ca   Scheduled Meds: . sodium chloride   Intravenous Once  . allopurinol  300 mg Oral Daily  . atorvastatin  20 mg Oral q1800  . carvedilol  12.5 mg Oral BID WC  . Chlorhexidine Gluconate Cloth  6 each Topical Q0600  . feeding supplement  1 Container Oral Q24H  . feeding supplement (ENSURE ENLIVE)  237 mL Oral BID PC  . fluconazole  100 mg Oral Daily  . furosemide  20 mg Intravenous Once  . multivitamin with minerals  1 tablet Oral Daily  . mupirocin ointment  1 application Nasal BID  . pantoprazole (PROTONIX) IV  40 mg Intravenous Q12H  . piperacillin-tazobactam (ZOSYN)  IV  3.375 g Intravenous Q8H  . potassium chloride  40 mEq Oral Once  . sodium chloride flush  3 mL Intravenous Q12H   Continuous Infusions: . sodium chloride 20 mL/hr at 09/15/16 0641   PRN Meds:.acetaminophen **OR** acetaminophen, albuterol, HYDROcodone-acetaminophen, iopamidol, ondansetron **OR** ondansetron (ZOFRAN) IV Medications Prior to Admission:  Prior to Admission medications   Medication Sig Start Date End Date Taking? Authorizing Provider  acetaminophen (ACETAMINOPHEN 8 HOUR) 650 MG CR tablet Take 1,300 mg by mouth every 8 (eight) hours as needed for pain.   Yes Historical Provider, MD  albuterol (PROVENTIL HFA;VENTOLIN HFA) 108 (90 Base) MCG/ACT inhaler Inhale 1-2 puffs into the lungs every 6 (six) hours as needed for wheezing or shortness of breath.   Yes Historical Provider, MD  allopurinol (ZYLOPRIM) 300 MG tablet Take 300 mg by mouth daily.     Yes Historical Provider, MD  atorvastatin (LIPITOR) 20 MG tablet TAKE 1 TABLET (20 MG TOTAL) BY MOUTH DAILY. 04/06/12  Yes Thayer Headings, MD  carvedilol (COREG) 25 MG tablet TAKE 1 TABLET (25 MG TOTAL) BY MOUTH 2 (TWO) TIMES DAILY. 12/26/14  Yes Thayer Headings, MD  doxazosin (CARDURA) 4 MG tablet Take 4 mg by mouth at bedtime.     Yes Historical Provider, MD  feeding supplement (BOOST / RESOURCE BREEZE) LIQD Take 1  Container by mouth 2 (two) times daily between meals. 08/16/16  Yes [place], DO  fluconazole (DIFLUCAN) 100 MG tablet Take 1 tablet (100 mg total) by mouth daily. 08/17/16  Yes [place], DO  furosemide (LASIX) 80 MG tablet Take 80 mg by mouth daily.    Yes Historical Provider, MD  Multiple Vitamins-Minerals (MULTIVITAMIN WITH MINERALS) tablet  Take 1 tablet by mouth daily.    Yes Historical Provider, MD  NIFEdipine (PROCARDIA-XL/ADALAT CC) 30 MG 24 hr tablet Take 1 tablet (30 mg total) by mouth daily. 08/21/16 09/20/16 Yes Patrecia Pour, MD  pantoprazole (PROTONIX) 40 MG tablet Take 1 tablet (40 mg total) by mouth 2 (two) times daily. 08/16/16  Yes Gardner, DO  potassium chloride SA (K-DUR,KLOR-CON) 20 MEQ tablet Take 20 mEq by mouth 2 (two) times daily.     Yes Historical Provider, MD   No Known Allergies Review of Systems  Physical Exam  General exam: Appears calm and comfortable  Respiratory system: Clear to auscultation. Respiratory effort normal. Cardiovascular system: S1 & S2 heard, RRR. No JVD, murmurs, rubs, gallops or clicks. No pedal edema. Gastrointestinal system: Abdomen is nondistended, soft and nontender. No organomegaly or masses felt. Normal bowel sounds heard. Central nervous system: Alert and oriented. No focal neurological deficits. Extremities: Symmetric 5 x 5 power. Skin: small nodule right knee, no drainage,. No redness.   Vital Signs: BP (!) 131/53   Pulse 80   Temp 99.4 F (37.4 C) (Oral)   Resp (!) 24   Ht 6' 1"  (1.854 m)   Wt 79.2 kg (174 lb 9.7 oz)   SpO2 100%   BMI 23.04 kg/m  Pain Assessment: No/denies pain       SpO2: SpO2: 100 % O2 Device:SpO2: 100 % O2 Flow Rate: .   IO: Intake/output summary:  Intake/Output Summary (Last 24 hours) at 09/15/16 1754 Last data filed at 09/15/16 1738  Gross per 24 hour  Intake              883 ml  Output             1600 ml  Net             -717 ml    LBM: Last BM Date:  09/15/16 Baseline Weight: Weight: 77.6 kg (171 lb) Most recent weight: Weight: 79.2 kg (174 lb 9.7 oz)     Palliative Assessment/Data:   Flowsheet Rows     Most Recent Value  Intake Tab  Referral Department  Hospitalist  Unit at Time of Referral  Intermediate Care Unit  Palliative Care Primary Diagnosis  Cancer  Date Notified  09/15/16  Palliative Care Type  New Palliative care  Reason for referral  Clarify Goals of Care  Date of Admission  09/10/16  Date first seen by Palliative Care  09/15/16  # of days Palliative referral response time  0 Day(s)  # of days IP prior to Palliative referral  5  Clinical Assessment  Palliative Performance Scale Score  60%  Pain Max last 24 hours  1  Pain Min Last 24 hours  0  Psychosocial & Spiritual Assessment  Palliative Care Outcomes  Patient/Family meeting held?  No      Time In: 1200 Time Out: 1255 Time Total: 55 Greater than 50%  of this time was spent counseling and coordinating care related to the above assessment and plan.  Signed by: Micheline Rough, MD   Please contact Palliative Medicine Team phone at [phone] for questions and concerns.  For individual provider: See Shea Evans

## 2016-09-15 NOTE — Progress Notes (Signed)
CRITICAL VALUE ALERT  Critical value received:  WBC 1.4, [place] 21  Date of notification:  09/15/2016  Time of notification:  1011  Critical value read back:Yes.    Nurse who received alert:  Jeannie Fend RN  MD notified (1st page):  Dr. Tyrell Antonio  Time of first page:  1036 Text page  MD notified (2nd page):  Time of second page:  Responding MD:  Dr. Tyrell Antonio  Time MD responded:  1036 text page

## 2016-09-16 DIAGNOSIS — C160 Malignant neoplasm of cardia: Secondary | ICD-10-CM

## 2016-09-16 DIAGNOSIS — Z7189 Other specified counseling: Secondary | ICD-10-CM

## 2016-09-16 LAB — TYPE AND SCREEN
ABO/RH(D): A POS
Antibody Screen: NEGATIVE
UNIT DIVISION: 0
UNIT DIVISION: 0
UNIT DIVISION: 0
UNIT DIVISION: 0
Unit division: 0
Unit division: 0
Unit division: 0

## 2016-09-16 LAB — BPAM PLATELET PHERESIS
Blood Product Expiration Date: 201803182359
ISSUE DATE / TIME: 201803171630
Unit Type and Rh: 6200

## 2016-09-16 LAB — CBC
HEMATOCRIT: 20.7 % — AB (ref 39.0–52.0)
HEMOGLOBIN: 7.1 g/dL — AB (ref 13.0–17.0)
MCH: 27.7 pg (ref 26.0–34.0)
MCHC: 34.3 g/dL (ref 30.0–36.0)
MCV: 80.9 fL (ref 78.0–100.0)
Platelets: 30 10*3/uL — ABNORMAL LOW (ref 150–400)
RBC: 2.56 MIL/uL — AB (ref 4.22–5.81)
RDW: 17.6 % — ABNORMAL HIGH (ref 11.5–15.5)
WBC: 1.5 10*3/uL — AB (ref 4.0–10.5)

## 2016-09-16 LAB — PREPARE PLATELET PHERESIS: Unit division: 0

## 2016-09-16 LAB — BPAM RBC
BLOOD PRODUCT EXPIRATION DATE: 201804062359
BLOOD PRODUCT EXPIRATION DATE: 201804092359
Blood Product Expiration Date: 201804032359
Blood Product Expiration Date: 201804032359
Blood Product Expiration Date: 201804042359
Blood Product Expiration Date: 201804052359
Blood Product Expiration Date: 201804092359
ISSUE DATE / TIME: 201803132045
ISSUE DATE / TIME: 201803141312
ISSUE DATE / TIME: 201803142159
ISSUE DATE / TIME: 201803161202
ISSUE DATE / TIME: 201803132313
ISSUE DATE / TIME: 201803140930
ISSUE DATE / TIME: 201803161604
UNIT TYPE AND RH: 6200
UNIT TYPE AND RH: 6200
UNIT TYPE AND RH: 6200
UNIT TYPE AND RH: 6200
Unit Type and Rh: 6200
Unit Type and Rh: 6200
Unit Type and Rh: 6200

## 2016-09-16 LAB — BASIC METABOLIC PANEL
ANION GAP: 5 (ref 5–15)
BUN: 36 mg/dL — ABNORMAL HIGH (ref 6–20)
CHLORIDE: 113 mmol/L — AB (ref 101–111)
CO2: 23 mmol/L (ref 22–32)
Calcium: 7.8 mg/dL — ABNORMAL LOW (ref 8.9–10.3)
Creatinine, Ser: 1.09 mg/dL (ref 0.61–1.24)
GFR calc non Af Amer: >60 mL/min (ref >60)
Glucose, Bld: 107 mg/dL — ABNORMAL HIGH (ref 65–99)
POTASSIUM: 3.5 mmol/L (ref 3.5–5.1)
SODIUM: 141 mmol/L (ref 135–145)

## 2016-09-16 LAB — BODY FLUID CULTURE: CULTURE: NO GROWTH

## 2016-09-16 LAB — PREPARE RBC (CROSSMATCH)

## 2016-09-16 MED ORDER — HALOPERIDOL 0.5 MG PO TABS
0.5000 mg | ORAL_TABLET | ORAL | Status: DC | PRN
  Filled 2016-09-16: qty 1

## 2016-09-16 MED ORDER — LORAZEPAM 2 MG/ML PO CONC: 1.0000 mg | ORAL | Status: DC | PRN

## 2016-09-16 MED ORDER — SODIUM CHLORIDE 0.9 % IV SOLN
Freq: Once | INTRAVENOUS | Status: AC
  Administered 2016-09-16: 10:00:00 via INTRAVENOUS

## 2016-09-16 MED ORDER — HALOPERIDOL LACTATE 2 MG/ML PO CONC
0.5000 mg | ORAL | Status: DC | PRN
  Filled 2016-09-16: qty 0.3

## 2016-09-16 MED ORDER — MORPHINE SULFATE (PF) 4 MG/ML IV SOLN: 1.0000 mg | INTRAVENOUS | Status: DC | PRN

## 2016-09-16 MED ORDER — MORPHINE SULFATE (CONCENTRATE) 10 MG/0.5ML PO SOLN: 5.0000 mg | ORAL | Status: DC | PRN

## 2016-09-16 MED ORDER — HALOPERIDOL LACTATE 5 MG/ML IJ SOLN: 0.5000 mg | INTRAMUSCULAR | Status: DC | PRN

## 2016-09-16 MED ORDER — FUROSEMIDE 10 MG/ML IJ SOLN
20.0000 mg | Freq: Once | INTRAMUSCULAR | Status: AC
  Administered 2016-09-16: 20 mg via INTRAVENOUS
  Filled 2016-09-16: qty 2

## 2016-09-16 MED ORDER — LORAZEPAM 1 MG PO TABS
1.0000 mg | ORAL_TABLET | ORAL | Status: DC | PRN
  Administered 2016-09-16: 1 mg via ORAL
  Filled 2016-09-16: qty 1

## 2016-09-16 MED ORDER — MORPHINE SULFATE (CONCENTRATE) 10 MG/0.5ML PO SOLN
5.0000 mg | ORAL | Status: DC | PRN
  Administered 2016-09-16: 5 mg via SUBLINGUAL
  Filled 2016-09-16: qty 0.5

## 2016-09-16 MED ORDER — LORAZEPAM 2 MG/ML IJ SOLN: 1.0000 mg | INTRAMUSCULAR | Status: DC | PRN

## 2016-09-16 NOTE — Progress Notes (Signed)
[name]   DOB:Jan 29, 1934   ZW#:[id number]    Subjective: He feels weak and tired.  Denies pain.  Objective:  Vitals:   09/16/16 0600 09/16/16 0800  BP: (!) 135/53 (!) 143/64  Pulse: 76 79  Resp: (!) 24 (!) 25  Temp:  98.5 F (36.9 C)     Intake/Output Summary (Last 24 hours) at 09/16/16 0909 Last data filed at 09/16/16 4235  Gross per 24 hour  Intake            791.5 ml  Output             1525 ml  Net           -733.5 ml    GENERAL:alert, no distress and comfortable.  He looks thin and cachectic SKIN: skin color, texture, turgor are normal, no rashes or significant lesions EYES: normal, Conjunctiva are pink and non-injected, sclera clear Musculoskeletal:no cyanosis of digits and no clubbing  NEURO: alert & oriented x 3 with fluent speech, no focal motor/sensory deficits   Labs:  Lab Results  Component Value Date   WBC 1.5 (L) 09/16/2016   HGB 7.1 (L) 09/16/2016   HCT 20.7 (L) 09/16/2016   MCV 80.9 09/16/2016   PLT 30 (L) 09/16/2016   NEUTROABS 1.5 (L) 09/12/2016    Lab Results  Component Value Date   NA 141 09/16/2016   K 3.5 09/16/2016   CL 113 (H) 09/16/2016   CO2 23 09/16/2016    Studies: CT imaging is reviewed Ct Chest W Contrast  Result Date: 09/15/2016 CLINICAL DATA:  Staging workup for gastric cancer. Upper endoscopy on 09/13/2016 showed an anterior gastric wall ulcer and a duodenal ulcer. Both ulcers revealed adenocarcinoma. EXAM: CT CHEST, ABDOMEN, AND PELVIS WITH CONTRAST TECHNIQUE: Multidetector CT imaging of the chest, abdomen and pelvis was performed following the standard protocol during bolus administration of intravenous contrast. CONTRAST:  123mL ISOVUE-300 IOPAMIDOL (ISOVUE-300) INJECTION 61% COMPARISON:  Multiple exams, including CT chest 11/27/2008 FINDINGS: CT CHEST FINDINGS Cardiovascular: Coronary, aortic arch, and branch vessel atherosclerotic vascular disease. Mild cardiomegaly. Prior median sternotomy. Mediastinum/Nodes: Right lower  paratracheal node 1.9 cm in short axis on image 20/2. Chronic prevascular and AP window lymph nodes are borderline enlarged. Right hilar node 1.6 cm in short axis on image 26/2. Suspected left infrahilar adenopathy. Lungs/Pleura: Small bilateral pleural effusions without well-defined pleural mass. These effusions are nonspecific for exudative versus transudative etiology. Volume loss in the left lower lobe. Possible left infrahilar mass on image 29/2, 4.3 by 2.1 cm, although significant portions of this may be due to atelectasis. The superior segmental bronchus is occluded in this vicinity. Secondary pulmonary lobular interstitial accentuation is observed. New 5 by 3 mm apical segment right upper lobe pulmonary nodule, image 17/7. Other scattered small new pulmonary nodules are present. Despite efforts by the technologist and patient, motion artifact is present on today's exam and could not be eliminated. This reduces exam sensitivity and specificity. Chronic medial right lower lobe density could be due it to atelectasis but is technically nonspecific. Musculoskeletal: Unremarkable CT ABDOMEN PELVIS FINDINGS Hepatobiliary: Initial imaging is in the arterial phase of contrast, probably due to a reduced cardiac output. This may reduce sensitivity in assessing the solid organs. Indistinctly marginated 1.8 cm hypodense lesion in segment 7 of the liver on image 55/2, concerning for possible metastatic lesion. Several other speckled hypodensities are present in the liver but technically nonspecific due to small size, motion artifact, and early contrast phase. Multiple gallstones  filling the somewhat contracted gallbladder. No biliary dilatation. Pancreas: Unremarkable Spleen: Old granulomatous disease. Adrenals/Urinary Tract: 5.7 by 4.9 cm right adrenal mass, date arterial phase density 47 Hounsfield units, delayed phase density 46 Hounsfield units, technically nonspecific. This level was not included on prior chest CTs  and accordingly I do not know if this is a chronic or more recent development. Renal cortical atrophy. Small left renal hypodense lesions are statistically likely to be small cysts but technically nonspecific due to small size. Urinary bladder unremarkable. Stomach/Bowel: The recently biopsied gastric and duodenal ulcers are surprisingly in apparent on today's CT scan. There is a 6 cm segment of abnormal wall thickening in the ascending colon which persists on the delayed images, an which has adjacent varicoid nodularity suspicious for local tumor deposit along the adjacent right paracolic gutter for example on image 28/4. On image 60/5 there is abnormal wall thickening in a 3.0 cm segment of small bowel in the right lower quadrant, with contrast in the adjacent lumen. On image 76/2 there is a 4.5 cm segment of wall thickening in the left-sided small bowel, with 2 adjacent small bowel mesentery lymph nodes measuring 1.5 and 1.3 cm in short axis on images 79 and 77 of series 2, respectively. Vascular/Lymphatic: Aortoiliac atherosclerotic vascular disease. An aortocaval lymph node has a short axis diameter of 1.6 cm on image 73/2. Reproductive: Enlarged prostate gland measures 6.8 by 4.6 by 7.0 cm (volume = 110 cm^3) and indents the bladder base. Other: Right upper quadrant omental nodule measures 9 mm in short axis on image 62/2. Bilateral subcutaneous edema along the flanks. Trace perihepatic ascites. Musculoskeletal: Stranding along the right hip adductor musculature, cause uncertain. Lumbar spondylosis, scoliosis, and degenerative disc disease causing impingement at all lumbar levels. IMPRESSION: 1. The recently diagnosed sites of adenocarcinoma in the stomach and duodenum are not well seen, but there are a variety of other concerning findings for malignancy including a hypodense liver lesion in the right hepatic lobe suspicious for metastatic focus; a few scattered small pulmonary nodules which were not present  in 2010; a 5.7 cm otherwise nonspecific right adrenal mass ; a 6 cm focal segment of abnormal wall thickening in the ascending colon with adjacent localized adenopathy ; a 4.5 segment of abnormal small bowel wall thickening in the left lower quadrant with adjacent adenopathy ; and a 3 cm segment of small bowel wall thickening in the right lower quadrant. There is also a pathologically enlarged aortocaval lymph node, as well as enlarged right paratracheal and right hilar lymph nodes. Possible left infrahilar mass with occlusion of the superior segment left lower lobe bronchus. Appearance concerning for somewhat widespread metastatic disease, nuclear medicine PET-CT may be helpful in confirmation of these findings in assessing for other lesions. 2. Small omental nodule in the right upper quadrant, potentially a metastatic focus. 3. Small bilateral pleural effusions without well-defined pleural mass. There is cardiomegaly and secondary pulmonary lobular interstitial accentuation which may reflect interstitial edema. 4. Multiple gallstones fill the somewhat contracted gallbladder. 5. Coronary, aortic arch, and branch vessel atherosclerotic vascular disease. Aortoiliac atherosclerotic vascular disease. 6. Enlarged prostate gland, volume 110 cc. 7. Trace perihepatic ascites. 8. Stranding along the right hip adductor musculature, cause uncertain. 9. Lumbar spondylosis, scoliosis, and degenerative disc disease causing impingement at all lumbar levels. Electronically Signed   By: Van Clines M.D.   On: 09/15/2016 14:09   Ct Abdomen Pelvis W Contrast  Result Date: 09/15/2016 CLINICAL DATA:  Staging workup for gastric cancer.  Upper endoscopy on 09/13/2016 showed an anterior gastric wall ulcer and a duodenal ulcer. Both ulcers revealed adenocarcinoma. EXAM: CT CHEST, ABDOMEN, AND PELVIS WITH CONTRAST TECHNIQUE: Multidetector CT imaging of the chest, abdomen and pelvis was performed following the standard protocol  during bolus administration of intravenous contrast. CONTRAST:  158mL ISOVUE-300 IOPAMIDOL (ISOVUE-300) INJECTION 61% COMPARISON:  Multiple exams, including CT chest 11/27/2008 FINDINGS: CT CHEST FINDINGS Cardiovascular: Coronary, aortic arch, and branch vessel atherosclerotic vascular disease. Mild cardiomegaly. Prior median sternotomy. Mediastinum/Nodes: Right lower paratracheal node 1.9 cm in short axis on image 20/2. Chronic prevascular and AP window lymph nodes are borderline enlarged. Right hilar node 1.6 cm in short axis on image 26/2. Suspected left infrahilar adenopathy. Lungs/Pleura: Small bilateral pleural effusions without well-defined pleural mass. These effusions are nonspecific for exudative versus transudative etiology. Volume loss in the left lower lobe. Possible left infrahilar mass on image 29/2, 4.3 by 2.1 cm, although significant portions of this may be due to atelectasis. The superior segmental bronchus is occluded in this vicinity. Secondary pulmonary lobular interstitial accentuation is observed. New 5 by 3 mm apical segment right upper lobe pulmonary nodule, image 17/7. Other scattered small new pulmonary nodules are present. Despite efforts by the technologist and patient, motion artifact is present on today's exam and could not be eliminated. This reduces exam sensitivity and specificity. Chronic medial right lower lobe density could be due it to atelectasis but is technically nonspecific. Musculoskeletal: Unremarkable CT ABDOMEN PELVIS FINDINGS Hepatobiliary: Initial imaging is in the arterial phase of contrast, probably due to a reduced cardiac output. This may reduce sensitivity in assessing the solid organs. Indistinctly marginated 1.8 cm hypodense lesion in segment 7 of the liver on image 55/2, concerning for possible metastatic lesion. Several other speckled hypodensities are present in the liver but technically nonspecific due to small size, motion artifact, and early contrast  phase. Multiple gallstones filling the somewhat contracted gallbladder. No biliary dilatation. Pancreas: Unremarkable Spleen: Old granulomatous disease. Adrenals/Urinary Tract: 5.7 by 4.9 cm right adrenal mass, date arterial phase density 47 Hounsfield units, delayed phase density 46 Hounsfield units, technically nonspecific. This level was not included on prior chest CTs and accordingly I do not know if this is a chronic or more recent development. Renal cortical atrophy. Small left renal hypodense lesions are statistically likely to be small cysts but technically nonspecific due to small size. Urinary bladder unremarkable. Stomach/Bowel: The recently biopsied gastric and duodenal ulcers are surprisingly in apparent on today's CT scan. There is a 6 cm segment of abnormal wall thickening in the ascending colon which persists on the delayed images, an which has adjacent varicoid nodularity suspicious for local tumor deposit along the adjacent right paracolic gutter for example on image 28/4. On image 60/5 there is abnormal wall thickening in a 3.0 cm segment of small bowel in the right lower quadrant, with contrast in the adjacent lumen. On image 76/2 there is a 4.5 cm segment of wall thickening in the left-sided small bowel, with 2 adjacent small bowel mesentery lymph nodes measuring 1.5 and 1.3 cm in short axis on images 79 and 77 of series 2, respectively. Vascular/Lymphatic: Aortoiliac atherosclerotic vascular disease. An aortocaval lymph node has a short axis diameter of 1.6 cm on image 73/2. Reproductive: Enlarged prostate gland measures 6.8 by 4.6 by 7.0 cm (volume = 110 cm^3) and indents the bladder base. Other: Right upper quadrant omental nodule measures 9 mm in short axis on image 62/2. Bilateral subcutaneous edema along the flanks. Trace  perihepatic ascites. Musculoskeletal: Stranding along the right hip adductor musculature, cause uncertain. Lumbar spondylosis, scoliosis, and degenerative disc disease  causing impingement at all lumbar levels. IMPRESSION: 1. The recently diagnosed sites of adenocarcinoma in the stomach and duodenum are not well seen, but there are a variety of other concerning findings for malignancy including a hypodense liver lesion in the right hepatic lobe suspicious for metastatic focus; a few scattered small pulmonary nodules which were not present in 2010; a 5.7 cm otherwise nonspecific right adrenal mass ; a 6 cm focal segment of abnormal wall thickening in the ascending colon with adjacent localized adenopathy ; a 4.5 segment of abnormal small bowel wall thickening in the left lower quadrant with adjacent adenopathy ; and a 3 cm segment of small bowel wall thickening in the right lower quadrant. There is also a pathologically enlarged aortocaval lymph node, as well as enlarged right paratracheal and right hilar lymph nodes. Possible left infrahilar mass with occlusion of the superior segment left lower lobe bronchus. Appearance concerning for somewhat widespread metastatic disease, nuclear medicine PET-CT may be helpful in confirmation of these findings in assessing for other lesions. 2. Small omental nodule in the right upper quadrant, potentially a metastatic focus. 3. Small bilateral pleural effusions without well-defined pleural mass. There is cardiomegaly and secondary pulmonary lobular interstitial accentuation which may reflect interstitial edema. 4. Multiple gallstones fill the somewhat contracted gallbladder. 5. Coronary, aortic arch, and branch vessel atherosclerotic vascular disease. Aortoiliac atherosclerotic vascular disease. 6. Enlarged prostate gland, volume 110 cc. 7. Trace perihepatic ascites. 8. Stranding along the right hip adductor musculature, cause uncertain. 9. Lumbar spondylosis, scoliosis, and degenerative disc disease causing impingement at all lumbar levels. Electronically Signed   By: Van Clines M.D.   On: 09/15/2016 14:09    Assessment & Plan:    Severe pancytopenia, on background history of MDS  It is not uncommon to see significant bone marrow suppression in the setting of recent infection. Progression of MDS cannot be ruled out  Widespread metastatic gastric/duodenal cancer Poor performance status I have reviewed the imaging study and discussed with the patient. He has significant comorbidities and is not a candidate for systemic chemotherapy, especially on the background history of severe pancytopenia and MDS Any treatment is not curative The patient had progressive bleeding and will likely succumb to disease soon The patient is realistic with expectation.  He would like to stop all aggressive measures including transfusion support and antibiotic therapy and agreed to comfort measures only This has been communicated with primary service  Acute on chronic renal failure, resolving Likely due to sepsis and dehydration This has improved with fluid resuscitation  Severe protein calorie malnutrition Dietitian consult and nutritional supplement as tolerated  Recent fungal esophagitis He would need at least 10-14 days total course of treatment for this He denies symptomatic dysphagia  Goals of care The patient has incurable bone marrow disease and now with widespread metastatic gastric cancer His overall prognosis is very poor I estimate his survival to be <14 days if we stop all transfusion support as he will continue to bleed The patient understood We discussed CODE STATUS and goals of care He agreed to change his CODE STATUS to DNR and goals of care to comfort measures only Appreciate palliative care consult and meeting  Discharge planning I recommend transferring him out of the ICU/stepdown unit to to get care management to coordinate transfer to [place] if possible  Heath Lark, MD 09/16/2016  9:09 AM

## 2016-09-16 NOTE — Progress Notes (Signed)
Pharmacy Antibiotic Note  [name] is a 83 y.o. male with hx MDS and GIB admitted on 09/10/2016 with sepsis.  Prior to admission patient was placed on levaquin s/p I&D of knee boil.  PCP office reported light growth of streptococcus anginosis from knee boil.  Patient was started on broad abx with vancomycin and zosyn for sepsis on admission and de-escalated to zosyn on 3/15.  Significant events:  3/13 CXR: no PNA noted 3/14 XR of R knee: Soft tissue swelling noted about the knee. Knee joint effusion most likely present. Ortho consulted.  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 Aspiration culture no growth to date, collected while on antibiotics. EGD shows gastric ulcer and duodenal ulcer.  Biopsy positive for gastric cancer.  Today, 09/16/2016: Day #7 Zosyn. WBC remains low, afebrile, AKI resolved.  Plan: Continue Zosyn 3.375g IV q8h (4 hour infusion time).  With AKI resolved, do no anticipate further dose adjustments to Zosyn dose. Pharmacy will sign off at this time. Please re-consult if needed. ______________________________  Height: 6\' 1"  (185.4 cm) Weight: 173 lb 15.1 oz (78.9 kg) IBW/kg (Calculated) : 79.9  Temp (24hrs), Avg:98.8 F (37.1 C), Min:98.4 F (36.9 C), Max:99.5 F (37.5 C)   Recent Labs Lab 09/10/16 1602 09/10/16 1911 09/11/16 0241  09/12/16 0335 09/12/16 1542 09/13/16 0350 09/14/16 1045 09/15/16 0829 09/16/16 0648  WBC  --   --   --   < > 1.9* 1.8* 1.5* 1.6* 1.4* 1.5*  CREATININE  --   --   --   < > 1.79*  --  1.45* 1.15 1.01 1.09  LATICACIDVEN 4.83* 3.27* 2.0*  --   --   --   --   --   --   --   < > = values in this interval not displayed.  Estimated Creatinine Clearance: 60.3 mL/min (by C-G formula based on SCr of 1.09 mg/dL).    No Known Allergies  Antimicrobials this admission: Home fluconazole resumed for candidal esophagitis 3/13 vanc >> 3/14 3/13  zosyn >>  Microbiology results: 3/13 Flu PCR: neg 3/13 BCx 2: NGF 3/14 UCx: 40K E.coli (R= amp, unasyn, cipro) 314 MRSA PCR: positive (chlx + bactroban) 3/15 knee synovial fluid: ngtd 3/15 anaerobic synovial fluid: no anaerobes isolated, cx IP x 5 days   Thank you for allowing pharmacy to be a part of this patient's care.  Hershal Coria 09/16/2016 8:39 AM

## 2016-09-16 NOTE — Progress Notes (Signed)
PT Cancellation Note  Patient Details Name: ARVAL BRANDSTETTER MRN: [id number] DOB: Dec 23, 1933   Cancelled Treatment:    Reason Eval/Treat Not Completed: PT screened, no needs identified, will sign off (pt comfort care with less than 2 week life expectancy,  plan is to DC to [facility], PT will sign off. )   Philomena Doheny 09/16/2016, 2:27 PM [phone]

## 2016-09-16 NOTE — Progress Notes (Signed)
PROGRESS NOTE    [name]  XLK:[id number] DOB: 06-Jan-1934 DOA: 09/10/2016 PCP: Melinda Crutch, MD   Brief Narrative: [name] is a 83 y.o. male with medical history significant for hypertension, coronary artery disease status post CABG, chronic diastolic CHF, MDS, and recent admission for acute blood loss anemia attributed to bleeding esophageal ulcers, now presenting to the emergency department with 3 weeks of progressive generalized weakness and one day of fevers. Patient was discharged from the hospital on 08/16/2016 after management for acute blood loss anemia attributed to bleeding esophageal ulcers. He reports being in much improved condition upon discharge and felt well at home until approximately 3 weeks ago when he noted the insidious development of generalized weakness. He reports continued melanotic stools since the hospital discharge, but denies abdominal pain or nausea   Assessment & Plan:   Principal Problem:   Symptomatic anemia Active Problems:   CAD (coronary artery disease)   HTN (hypertension)   MDS (myelodysplastic syndrome), low grade (HCC)   Pancytopenia (HCC)   Ulcer of esophagus with bleeding   Severe sepsis (HCC)   Chronic diastolic CHF (congestive heart failure) (HCC)   AKI (acute kidney injury) ([facility])   Adenocarcinoma of unknown origin ([place])  1-Acute Blood loss anemia, GI bleed; Gastric Ulcer and Duodenal Ulcer; Adenocarcinoma -Pt presents with melena, FOBT+ stool, Hgb 3.9 (8.0 on 08/16/16), BUN 70 -EGD (08/12/16) with bleeding esophageal ulcers and findings concerning for severe candidal esophagitis; he has been taking Diflucan and Protonix BID for past month at home. -Continue with IV protonix. Diflucan.  -He has  received 7 units PRBC during this admission.  -3 units of platelets, counting one unit today  -GI consulted, Patient underwent endoscopy 3-15, which showed Gastric Ulcer and Duodenal Ulcer. Pathology came back with adenocarcinoma.  -oncology  recommending to keep hb above 8.  -biopsy came back with adenocarcinoma. Dr Lindi Adie, order CT abdomen , chest for staging.  -CT with metastasis diseases.  -Dr Alvy Bimler is recommending palliative care.  Palliative care meeting today.  Will transfuse another units PRBC, awaiting palliative care meeting today.  Still with multiples BM, melena  2-Pancytopenia;   Has history MDS.  Worsening count in setting of infection or bleeding.  Transfusion as needed.     3-Sepsis;   Presents with fever 102 F , leukopenia to 2.9k, elevated lactate, and hypotension  He report dysuria. Continue with IV antibiotics in setting leukopenia.  Urine culture growing 40,000 ecoli, sensitive to ceftriaxone.  Blood culture no growth to date.  Lactic acid has decreased to 2 from 4.  Stop vanc, continue with zosyn.   4-Right knee with boil. Prior knee replacement.  X ray with possible effusion/  I called PCP office culture from boil grew : light growth of streptococcus anginosis.  I have consulted Dr Cay Schillings.  Patient may have sinus tract on his right knee. Follow culture results from aspiration from 3-15. He may need open debridement and poly exchange. This on hold due to new diagnosis of cancer.  Continue with zosyn. Will ask Dr Donnamae Jude plan for antibiotics.  Culture no growth.   AKI; SCr is 2.49 on admission, up from 1.13 one month prior In setting of hypovolemia, anemia, infection.  NSL fluids.  Improving.   Chronic diastolic CHF  Hold diuretics.   Hypertension  Hold for now nifedipine, and Cardura due to hypotension, sepsis, GI bleed.  Resume coreg.    CAD - Status-post CABG  - resume coreg.   Hypokalemia; replete  orally.    DVT prophylaxis: SCD. No anticoagulation in setting of GI bleed.  Code Status: Full code.  Family Communication: daughter updated 91-17. Palliative care consulted.  Disposition Plan:  Remain in the step down unit. Awaiting palliative care meeting   Consultants:    GI  Procedures: None  Antimicrobials:  Vancomycin 3-13 Zosyn 3-13  Subjective: He denies having any pain.  Has had 4 BM, still melena.  He was able to speak with Dr Alvy Bimler.     Objective: Vitals:   09/16/16 0400 09/16/16 0500 09/16/16 0600 09/16/16 0800  BP: (!) 111/35  (!) 135/53 (!) 143/64  Pulse: 77  76 79  Resp: (!) 24  (!) 24 (!) 25  Temp:    98.5 F (36.9 C)  TempSrc:    Oral  SpO2: 99%  98% 99%  Weight:  78.9 kg (173 lb 15.1 oz)    Height:        Intake/Output Summary (Last 24 hours) at 09/16/16 0856 Last data filed at 09/16/16 9622  Gross per 24 hour  Intake            791.5 ml  Output             1525 ml  Net           -733.5 ml   Filed Weights   09/14/16 0500 09/15/16 0500 09/16/16 0500  Weight: 79.5 kg (175 lb 4.3 oz) 79.2 kg (174 lb 9.7 oz) 78.9 kg (173 lb 15.1 oz)    Examination:  General exam: Appears calm and comfortable  Respiratory system: Clear to auscultation. Respiratory effort normal. Cardiovascular system: S1 & S2 heard, RRR. No JVD, murmurs, rubs, gallops or clicks. No pedal edema. Gastrointestinal system: Abdomen is nondistended, soft and nontender. No organomegaly or masses felt. Normal bowel sounds heard. Central nervous system: Alert and oriented. No focal neurological deficits. Extremities: Symmetric 5 x 5 power. Skin: small nodule right knee, no drainage,. No redness.      Data Reviewed: I have personally reviewed following labs and imaging studies  CBC:  Recent Labs Lab 09/10/16 1600  09/11/16 0537  09/12/16 0335 09/12/16 1542  09/13/16 0350 09/13/16 2032 09/14/16 1045 09/15/16 0829 09/16/16 0648  WBC 2.0*  --  1.7*  < > 1.9* 1.8*  --  1.5*  --  1.6* 1.4* 1.5*  NEUTROABS 1.6*  --  1.4*  --  1.5*  --   --   --   --   --   --   --   HGB 3.9*  < > 5.6*  < > 7.9* 8.1*  < > 7.7* 8.7* 8.1* 7.1* 7.1*  HCT 12.2*  < > 16.6*  < > 22.8* 23.6*  < > 22.7* 25.8* 24.1* 21.0* 20.7*  MCV 82.4  --  84.7  < > 82.0 81.9  --   84.4  --  82.8 82.4 80.9  PLT 36*  --  30*  < > 33* 50*  --  35*  --  20* 21* 30*  < > = values in this interval not displayed. Basic Metabolic Panel:  Recent Labs Lab 09/12/16 0335 09/13/16 0350 09/14/16 1045 09/15/16 0829 09/16/16 0648  NA 146* 141 137 140 141  K 3.1* 3.4* 3.5 3.2* 3.5  CL 118* 116* 112* 113* 113*  CO2 21* 19* 23 24 23   GLUCOSE 100* 149* 162* 96 107*  BUN 50* 39* 31* 30* 36*  CREATININE 1.79* 1.45* 1.15 1.01 1.09  CALCIUM 8.3* 7.9*  7.5* 7.8* 7.8*   GFR: Estimated Creatinine Clearance: 60.3 mL/min (by C-G formula based on SCr of 1.09 mg/dL). Liver Function Tests:  Recent Labs Lab 09/10/16 1600  AST 34  ALT 20  ALKPHOS 46  BILITOT 0.6  PROT 5.6*  ALBUMIN 1.9*   No results for input(s): LIPASE, AMYLASE in the last 168 hours. No results for input(s): AMMONIA in the last 168 hours. Coagulation Profile: No results for input(s): INR, PROTIME in the last 168 hours. Cardiac Enzymes: No results for input(s): CKTOTAL, CKMB, CKMBINDEX, TROPONINI in the last 168 hours. BNP (last 3 results) No results for input(s): PROBNP in the last 8760 hours. HbA1C: No results for input(s): HGBA1C in the last 72 hours. CBG:  Recent Labs Lab 09/14/16 0751 09/14/16 1201 09/15/16 0929 09/15/16 1130 09/15/16 1844  GLUCAP 130* 150* 119* 152* 149*   Lipid Profile: No results for input(s): CHOL, HDL, LDLCALC, TRIG, CHOLHDL, LDLDIRECT in the last 72 hours. Thyroid Function Tests: No results for input(s): TSH, T4TOTAL, FREET4, T3FREE, THYROIDAB in the last 72 hours. Anemia Panel: No results for input(s): VITAMINB12, FOLATE, FERRITIN, TIBC, IRON, RETICCTPCT in the last 72 hours. Sepsis Labs:  Recent Labs Lab 09/10/16 1602 09/10/16 1911 09/11/16 0241  LATICACIDVEN 4.83* 3.27* 2.0*    Recent Results (from the past 240 hour(s))  Culture, blood (routine x 2)     Status: None   Collection Time: 09/10/16  3:11 PM  Result Value Ref Range Status   Specimen Description  BLOOD RIGHT HAND  Final   Special Requests BOTTLES DRAWN AEROBIC ONLY 5CC  Final   Culture   Final    NO GROWTH 5 DAYS Performed at [facility], [place] [address] [place] [address]    Report Status 09/15/2016 FINAL  Final  Culture, blood (routine x 2)     Status: None   Collection Time: 09/10/16  4:10 PM  Result Value Ref Range Status   Specimen Description RIGHT ANTECUBITAL  Final   Special Requests BOTTLES DRAWN AEROBIC AND ANAEROBIC 5CC  Final   Culture   Final    NO GROWTH 5 DAYS Performed at [facility], [place] [address], [place], [place] [address]    Report Status 09/15/2016 FINAL  Final  Urine culture     Status: Abnormal   Collection Time: 09/11/16 12:39 AM  Result Value Ref Range Status   Specimen Description URINE, RANDOM  Final   Special Requests NONE  Final   Culture 40,000 COLONIES/mL ESCHERICHIA COLI (A)  Final   Report Status 09/13/2016 FINAL  Final   Organism ID, Bacteria ESCHERICHIA COLI (A)  Final      Susceptibility   Escherichia coli - MIC*    AMPICILLIN >=32 RESISTANT Resistant     CEFAZOLIN <=4 SENSITIVE Sensitive     CEFTRIAXONE <=1 SENSITIVE Sensitive     CIPROFLOXACIN >=4 RESISTANT Resistant     GENTAMICIN <=1 SENSITIVE Sensitive     IMIPENEM <=0.25 SENSITIVE Sensitive     NITROFURANTOIN <=16 SENSITIVE Sensitive     TRIMETH/SULFA <=20 SENSITIVE Sensitive     AMPICILLIN/SULBACTAM >=32 RESISTANT Resistant     PIP/TAZO <=4 SENSITIVE Sensitive     Extended ESBL NEGATIVE Sensitive     * 40,000 COLONIES/mL ESCHERICHIA COLI  MRSA PCR Screening     Status: Abnormal   Collection Time: 09/11/16 12:58 PM  Result Value Ref Range Status   MRSA by PCR POSITIVE (A) NEGATIVE Final    Comment:  The GeneXpert MRSA Assay (FDA approved for NASAL specimens only), is one component of a comprehensive MRSA colonization surveillance program. It is not intended to diagnose MRSA infection nor to guide or monitor treatment for MRSA  infections. RESULT CALLED TO, READ BACK BY AND VERIFIED WITH: F.AKANBE RN AT 8341 ON 09/11/16 BY S.VANHOORNE   Body fluid culture     Status: None (Preliminary result)   Collection Time: 09/12/16  7:46 PM  Result Value Ref Range Status   Specimen Description SYNOVIAL RIGHT KNEE  Final   Special Requests Immunocompromised  Final   Gram Stain   Final    ABUNDANT WBC PRESENT, PREDOMINANTLY PMN NO ORGANISMS SEEN Gram Stain Report Called to,Read Back By and Verified With: Dondra Spry RN 2054 09/12/16 A NAVARRO    Culture   Final    NO GROWTH 2 DAYS Performed at [facility], [place] [address], [place], [place] [address]    Report Status PENDING  Incomplete  Anaerobic culture     Status: None (Preliminary result)   Collection Time: 09/12/16  7:46 PM  Result Value Ref Range Status   Specimen Description SYNOVIAL RIGHT KNEE  Final   Special Requests Immunocompromised  Final   Culture   Final    NO ANAEROBES ISOLATED; CULTURE IN PROGRESS FOR 5 DAYS   Report Status PENDING  Incomplete         Radiology Studies: Ct Chest W Contrast  Result Date: 09/15/2016 CLINICAL DATA:  Staging workup for gastric cancer. Upper endoscopy on 09/13/2016 showed an anterior gastric wall ulcer and a duodenal ulcer. Both ulcers revealed adenocarcinoma. EXAM: CT CHEST, ABDOMEN, AND PELVIS WITH CONTRAST TECHNIQUE: Multidetector CT imaging of the chest, abdomen and pelvis was performed following the standard protocol during bolus administration of intravenous contrast. CONTRAST:  131mL ISOVUE-300 IOPAMIDOL (ISOVUE-300) INJECTION 61% COMPARISON:  Multiple exams, including CT chest 11/27/2008 FINDINGS: CT CHEST FINDINGS Cardiovascular: Coronary, aortic arch, and branch vessel atherosclerotic vascular disease. Mild cardiomegaly. Prior median sternotomy. Mediastinum/Nodes: Right lower paratracheal node 1.9 cm in short axis on image 20/2. Chronic prevascular and AP window lymph nodes are borderline enlarged. Right hilar  node 1.6 cm in short axis on image 26/2. Suspected left infrahilar adenopathy. Lungs/Pleura: Small bilateral pleural effusions without well-defined pleural mass. These effusions are nonspecific for exudative versus transudative etiology. Volume loss in the left lower lobe. Possible left infrahilar mass on image 29/2, 4.3 by 2.1 cm, although significant portions of this may be due to atelectasis. The superior segmental bronchus is occluded in this vicinity. Secondary pulmonary lobular interstitial accentuation is observed. New 5 by 3 mm apical segment right upper lobe pulmonary nodule, image 17/7. Other scattered small new pulmonary nodules are present. Despite efforts by the technologist and patient, motion artifact is present on today's exam and could not be eliminated. This reduces exam sensitivity and specificity. Chronic medial right lower lobe density could be due it to atelectasis but is technically nonspecific. Musculoskeletal: Unremarkable CT ABDOMEN PELVIS FINDINGS Hepatobiliary: Initial imaging is in the arterial phase of contrast, probably due to a reduced cardiac output. This may reduce sensitivity in assessing the solid organs. Indistinctly marginated 1.8 cm hypodense lesion in segment 7 of the liver on image 55/2, concerning for possible metastatic lesion. Several other speckled hypodensities are present in the liver but technically nonspecific due to small size, motion artifact, and early contrast phase. Multiple gallstones filling the somewhat contracted gallbladder. No biliary dilatation. Pancreas: Unremarkable Spleen: Old granulomatous disease. Adrenals/Urinary Tract: 5.7  by 4.9 cm right adrenal mass, date arterial phase density 47 Hounsfield units, delayed phase density 46 Hounsfield units, technically nonspecific. This level was not included on prior chest CTs and accordingly I do not know if this is a chronic or more recent development. Renal cortical atrophy. Small left renal hypodense lesions  are statistically likely to be small cysts but technically nonspecific due to small size. Urinary bladder unremarkable. Stomach/Bowel: The recently biopsied gastric and duodenal ulcers are surprisingly in apparent on today's CT scan. There is a 6 cm segment of abnormal wall thickening in the ascending colon which persists on the delayed images, an which has adjacent varicoid nodularity suspicious for local tumor deposit along the adjacent right paracolic gutter for example on image 28/4. On image 60/5 there is abnormal wall thickening in a 3.0 cm segment of small bowel in the right lower quadrant, with contrast in the adjacent lumen. On image 76/2 there is a 4.5 cm segment of wall thickening in the left-sided small bowel, with 2 adjacent small bowel mesentery lymph nodes measuring 1.5 and 1.3 cm in short axis on images 79 and 77 of series 2, respectively. Vascular/Lymphatic: Aortoiliac atherosclerotic vascular disease. An aortocaval lymph node has a short axis diameter of 1.6 cm on image 73/2. Reproductive: Enlarged prostate gland measures 6.8 by 4.6 by 7.0 cm (volume = 110 cm^3) and indents the bladder base. Other: Right upper quadrant omental nodule measures 9 mm in short axis on image 62/2. Bilateral subcutaneous edema along the flanks. Trace perihepatic ascites. Musculoskeletal: Stranding along the right hip adductor musculature, cause uncertain. Lumbar spondylosis, scoliosis, and degenerative disc disease causing impingement at all lumbar levels. IMPRESSION: 1. The recently diagnosed sites of adenocarcinoma in the stomach and duodenum are not well seen, but there are a variety of other concerning findings for malignancy including a hypodense liver lesion in the right hepatic lobe suspicious for metastatic focus; a few scattered small pulmonary nodules which were not present in 2010; a 5.7 cm otherwise nonspecific right adrenal mass ; a 6 cm focal segment of abnormal wall thickening in the ascending colon with  adjacent localized adenopathy ; a 4.5 segment of abnormal small bowel wall thickening in the left lower quadrant with adjacent adenopathy ; and a 3 cm segment of small bowel wall thickening in the right lower quadrant. There is also a pathologically enlarged aortocaval lymph node, as well as enlarged right paratracheal and right hilar lymph nodes. Possible left infrahilar mass with occlusion of the superior segment left lower lobe bronchus. Appearance concerning for somewhat widespread metastatic disease, nuclear medicine PET-CT may be helpful in confirmation of these findings in assessing for other lesions. 2. Small omental nodule in the right upper quadrant, potentially a metastatic focus. 3. Small bilateral pleural effusions without well-defined pleural mass. There is cardiomegaly and secondary pulmonary lobular interstitial accentuation which may reflect interstitial edema. 4. Multiple gallstones fill the somewhat contracted gallbladder. 5. Coronary, aortic arch, and branch vessel atherosclerotic vascular disease. Aortoiliac atherosclerotic vascular disease. 6. Enlarged prostate gland, volume 110 cc. 7. Trace perihepatic ascites. 8. Stranding along the right hip adductor musculature, cause uncertain. 9. Lumbar spondylosis, scoliosis, and degenerative disc disease causing impingement at all lumbar levels. Electronically Signed   By: Van Clines M.D.   On: 09/15/2016 14:09   Ct Abdomen Pelvis W Contrast  Result Date: 09/15/2016 CLINICAL DATA:  Staging workup for gastric cancer. Upper endoscopy on 09/13/2016 showed an anterior gastric wall ulcer and a duodenal ulcer. Both ulcers revealed  adenocarcinoma. EXAM: CT CHEST, ABDOMEN, AND PELVIS WITH CONTRAST TECHNIQUE: Multidetector CT imaging of the chest, abdomen and pelvis was performed following the standard protocol during bolus administration of intravenous contrast. CONTRAST:  153mL ISOVUE-300 IOPAMIDOL (ISOVUE-300) INJECTION 61% COMPARISON:  Multiple  exams, including CT chest 11/27/2008 FINDINGS: CT CHEST FINDINGS Cardiovascular: Coronary, aortic arch, and branch vessel atherosclerotic vascular disease. Mild cardiomegaly. Prior median sternotomy. Mediastinum/Nodes: Right lower paratracheal node 1.9 cm in short axis on image 20/2. Chronic prevascular and AP window lymph nodes are borderline enlarged. Right hilar node 1.6 cm in short axis on image 26/2. Suspected left infrahilar adenopathy. Lungs/Pleura: Small bilateral pleural effusions without well-defined pleural mass. These effusions are nonspecific for exudative versus transudative etiology. Volume loss in the left lower lobe. Possible left infrahilar mass on image 29/2, 4.3 by 2.1 cm, although significant portions of this may be due to atelectasis. The superior segmental bronchus is occluded in this vicinity. Secondary pulmonary lobular interstitial accentuation is observed. New 5 by 3 mm apical segment right upper lobe pulmonary nodule, image 17/7. Other scattered small new pulmonary nodules are present. Despite efforts by the technologist and patient, motion artifact is present on today's exam and could not be eliminated. This reduces exam sensitivity and specificity. Chronic medial right lower lobe density could be due it to atelectasis but is technically nonspecific. Musculoskeletal: Unremarkable CT ABDOMEN PELVIS FINDINGS Hepatobiliary: Initial imaging is in the arterial phase of contrast, probably due to a reduced cardiac output. This may reduce sensitivity in assessing the solid organs. Indistinctly marginated 1.8 cm hypodense lesion in segment 7 of the liver on image 55/2, concerning for possible metastatic lesion. Several other speckled hypodensities are present in the liver but technically nonspecific due to small size, motion artifact, and early contrast phase. Multiple gallstones filling the somewhat contracted gallbladder. No biliary dilatation. Pancreas: Unremarkable Spleen: Old granulomatous  disease. Adrenals/Urinary Tract: 5.7 by 4.9 cm right adrenal mass, date arterial phase density 47 Hounsfield units, delayed phase density 46 Hounsfield units, technically nonspecific. This level was not included on prior chest CTs and accordingly I do not know if this is a chronic or more recent development. Renal cortical atrophy. Small left renal hypodense lesions are statistically likely to be small cysts but technically nonspecific due to small size. Urinary bladder unremarkable. Stomach/Bowel: The recently biopsied gastric and duodenal ulcers are surprisingly in apparent on today's CT scan. There is a 6 cm segment of abnormal wall thickening in the ascending colon which persists on the delayed images, an which has adjacent varicoid nodularity suspicious for local tumor deposit along the adjacent right paracolic gutter for example on image 28/4. On image 60/5 there is abnormal wall thickening in a 3.0 cm segment of small bowel in the right lower quadrant, with contrast in the adjacent lumen. On image 76/2 there is a 4.5 cm segment of wall thickening in the left-sided small bowel, with 2 adjacent small bowel mesentery lymph nodes measuring 1.5 and 1.3 cm in short axis on images 79 and 77 of series 2, respectively. Vascular/Lymphatic: Aortoiliac atherosclerotic vascular disease. An aortocaval lymph node has a short axis diameter of 1.6 cm on image 73/2. Reproductive: Enlarged prostate gland measures 6.8 by 4.6 by 7.0 cm (volume = 110 cm^3) and indents the bladder base. Other: Right upper quadrant omental nodule measures 9 mm in short axis on image 62/2. Bilateral subcutaneous edema along the flanks. Trace perihepatic ascites. Musculoskeletal: Stranding along the right hip adductor musculature, cause uncertain. Lumbar spondylosis, scoliosis, and degenerative  disc disease causing impingement at all lumbar levels. IMPRESSION: 1. The recently diagnosed sites of adenocarcinoma in the stomach and duodenum are not well  seen, but there are a variety of other concerning findings for malignancy including a hypodense liver lesion in the right hepatic lobe suspicious for metastatic focus; a few scattered small pulmonary nodules which were not present in 2010; a 5.7 cm otherwise nonspecific right adrenal mass ; a 6 cm focal segment of abnormal wall thickening in the ascending colon with adjacent localized adenopathy ; a 4.5 segment of abnormal small bowel wall thickening in the left lower quadrant with adjacent adenopathy ; and a 3 cm segment of small bowel wall thickening in the right lower quadrant. There is also a pathologically enlarged aortocaval lymph node, as well as enlarged right paratracheal and right hilar lymph nodes. Possible left infrahilar mass with occlusion of the superior segment left lower lobe bronchus. Appearance concerning for somewhat widespread metastatic disease, nuclear medicine PET-CT may be helpful in confirmation of these findings in assessing for other lesions. 2. Small omental nodule in the right upper quadrant, potentially a metastatic focus. 3. Small bilateral pleural effusions without well-defined pleural mass. There is cardiomegaly and secondary pulmonary lobular interstitial accentuation which may reflect interstitial edema. 4. Multiple gallstones fill the somewhat contracted gallbladder. 5. Coronary, aortic arch, and branch vessel atherosclerotic vascular disease. Aortoiliac atherosclerotic vascular disease. 6. Enlarged prostate gland, volume 110 cc. 7. Trace perihepatic ascites. 8. Stranding along the right hip adductor musculature, cause uncertain. 9. Lumbar spondylosis, scoliosis, and degenerative disc disease causing impingement at all lumbar levels. Electronically Signed   By: Van Clines M.D.   On: 09/15/2016 14:09        Scheduled Meds: . sodium chloride   Intravenous Once  . sodium chloride   Intravenous Once  . allopurinol  300 mg Oral Daily  . atorvastatin  20 mg Oral  q1800  . carvedilol  12.5 mg Oral BID WC  . Chlorhexidine Gluconate Cloth  6 each Topical Q0600  . feeding supplement  1 Container Oral Q24H  . feeding supplement (ENSURE ENLIVE)  237 mL Oral BID PC  . fluconazole  100 mg Oral Daily  . furosemide  20 mg Intravenous Once  . multivitamin with minerals  1 tablet Oral Daily  . mupirocin ointment  1 application Nasal BID  . pantoprazole (PROTONIX) IV  40 mg Intravenous Q12H  . piperacillin-tazobactam (ZOSYN)  IV  3.375 g Intravenous Q8H  . sodium chloride flush  3 mL Intravenous Q12H   Continuous Infusions: . sodium chloride 20 mL/hr at 09/16/16 0800     LOS: 6 days    Time spent: 35 minutes.     Elmarie Shiley, MD [facility] Pager [phone]  If 7PM-7AM, please contact night-coverage [email or web address] Password [facility] 09/16/2016, 8:56 AM

## 2016-09-16 NOTE — Progress Notes (Signed)
Nutrition Brief Note  Pt seen for full assessment on 3/15. Chart reviewed. Per Dr. Calton Dach note this AM, pt agreeable to comfort care only. Palliative Care to talk with pt/family today. During rounds this AM CSW reported contact with [facility] about possible placement. No further nutrition interventions warranted at this time.  Please re-consult as needed.     Jarome Matin, MS, RD, LDN, [facility] Inpatient Clinical Dietitian Pager # [phone] After hours/weekend pager # [phone]

## 2016-09-16 NOTE — Consult Note (Signed)
[facility] [employer]  Received request from [place] for family interest in [facility]. Chart reviewed and met with patient and daughters at bedside to complete paper work for transfer 09/17/16. Dr. Orpah Melter to assume care per family request.   Please fax discharge summary to [phone].  RN please call report to [phone].  Thank you,  Erling Conte, LCSW [phone]

## 2016-09-16 NOTE — Progress Notes (Signed)
OT Cancellation Note  Patient Details Name: [name] MRN: [id number] DOB: 08-30-1933   Cancelled Treatment:    Reason Eval/Treat Not Completed: Other (comment) OT screened, no needs identified, will sign off (pt comfort care with less than 2 week life expectancy,  plan is to DC to [facility], OT  will sign off. )  Marvin, Thereasa Parkin 09/16/2016, 3:46 PM

## 2016-09-16 NOTE — Progress Notes (Addendum)
Palliative care progress note  Reason for consult: goals if care in light of metastatic cancer and transfusion dependent anemia with GI bleed  I met today with patient and his 2 daughters. We discussed clinical course and discussion this AM with Dr. Alvy Bimler as well as wishes moving forward in light of new diagnosis of advanced metastatic disease.  We discussed difference between a aggressive medical intervention path and a palliative, comfort focused care path.  Values and goals of care important to patient and family were attempted to be elicited.  Mr. [name] is clear in his desire to focus on comfort moving forward.  We discussed options for discharge including home hospice vs residential hospice.  He and his family are familiar with [facility] from other family members, and he wants to pursue placement at [facility] for end of life care.    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  Once transfusions are stopped, his prognosis will be days to < 2 weeks.  We discussed that currently ordered transfusion will be his last.  I added on medication for comfort for use as needed while waiting transition to [facility].  Questions and concerns addressed.   PMT will continue to support holistically.  Time in: 1245 Time out 1330 Total time: 45 minutes  Micheline Rough, MD [place] Team [phone]

## 2016-09-16 NOTE — Progress Notes (Signed)
Patient's pre-blood vital signs were taken at 1322. At this time the patient's oral temp was 100.3 degrees F. MD was made aware of this temperature which is an increase from the previous value of 99.7. Per MD do not give second unit of blood. Vital signs otherwise remain stable and patient is alert and oriented x4.

## 2016-09-16 NOTE — Clinical Social Work Note (Signed)
Clinical Social Work Assessment  Patient Details  Name: [name] MRN: [id number] Date of Birth: [birth year]  Date of referral:  09/16/16               Reason for consult:  Discharge Planning, End of Life/Hospice                Permission sought to share information with:  Chartered certified accountant granted to share information::  Yes, Verbal Permission Granted  Name::        Agency::     Relationship::     Contact Information:     Housing/Transportation Living arrangements for the past 2 months:  Single Family Home Source of Information:  Adult Children Patient Interpreter Needed:  None Criminal Activity/Legal Involvement Pertinent to Current Situation/Hospitalization:  No - Comment as needed Significant Relationships:  Adult Children Lives with:  Adult Children Do you feel safe going back to the place where you live?   (Bernie recommended.) Need for family participation in patient care:  Yes (Comment)  Care giving concerns: Pt's care cannot be managed at home following hospital d/c.   Social Worker assessment / plan: Pt hospitalized from ome on 09/10/16 with Upper GIB, symptomatic anemia, pancytopenia. Palliative Care Team has been following to assist with [place]. CSW consulted to assist with [facility] placement. CSW met with pt / daughter [name] to provide hospice home choice. Pt / daughter have chosen [facility]. A referral has been provided to liaison, Erling Conte, and pt has been accepted to [facility] with d/c planned for 09/17/16. CSW will continue to follow to assist with d/c planning.  Employment status:  Retired Nurse, adult PT Recommendations:  Not assessed at this time Information / Referral to community resources:  Other (Comment Required) ([facility] info.)  Patient/Family's Response to care:  Pt / family are in agreement with plan for [facility] in am.  Patient/Family's  Understanding of and Emotional Response to Diagnosis, Current Treatment, and Prognosis: Pt / family are aware of pt's dx and prognosis. They appreciate the support provided by Palliative Care Team.  Emotional Assessment Appearance:  Appears stated age Attitude/Demeanor/Rapport:  Other (cooperative) Affect (typically observed):  Pleasant, Appropriate Orientation:  Oriented to Self, Oriented to Place, Oriented to  Time, Oriented to Situation Alcohol / Substance use:  Not Applicable Psych involvement (Current and /or in the community):  No (Comment)  Discharge Needs  Concerns to be addressed:  Discharge Planning Concerns Readmission within the last 30 days:  No Current discharge risk:  None Barriers to Discharge:  No Barriers Identified   Tegh Franek, Randall An, LCSW 09/16/2016, 2:59 PM

## 2016-09-17 LAB — BPAM PLATELET PHERESIS
Blood Product Expiration Date: 201803192359
ISSUE DATE / TIME: 201803190044
Unit Type and Rh: 600

## 2016-09-17 LAB — ANAEROBIC CULTURE

## 2016-09-17 LAB — PREPARE PLATELET PHERESIS: Unit division: 0

## 2016-09-17 MED ORDER — HALOPERIDOL 0.5 MG PO TABS: 0.5000 mg | ORAL_TABLET | ORAL | 0 refills | Status: AC | PRN

## 2016-09-17 MED ORDER — MORPHINE SULFATE (CONCENTRATE) 10 MG/0.5ML PO SOLN: 5.0000 mg | ORAL | 0 refills | Status: AC | PRN

## 2016-09-17 MED ORDER — LORAZEPAM 2 MG/ML PO CONC: 1.0000 mg | ORAL | 0 refills | Status: AC | PRN

## 2016-09-17 MED ORDER — ONDANSETRON HCL 4 MG PO TABS: 4.0000 mg | ORAL_TABLET | Freq: Four times a day (QID) | ORAL | 0 refills | Status: AC | PRN

## 2016-09-17 NOTE — Progress Notes (Signed)
LCSW following for disposition:  Hospice Home: [facility]  Documentation faxed for facility. Family aware of plan and agreeable to transfer. Patient will transfer by EMS in which LCSW arranged.  No other needs at this time. RN to call report.   Lane Hacker, MSW Clinical Social Work: [employer] Coverage for :  [phone]

## 2016-09-17 NOTE — Discharge Summary (Signed)
Physician Discharge Summary  SANTHIAGO COLLINGSWORTH NTZ:[id number] DOB: Jan 31, 1934 DOA: 09/10/2016  PCP: Melinda Crutch, MD  Admit date: 09/10/2016 Discharge date: 09/17/2016  Admitted From: Home  [facility]  [facility]  Recommendations for Outpatient Follow-up:  1. Follow up with PCP in 1-2 weeks 2. Please obtain BMP/CBC in one week 3. Please follow up on the following pending results:    Discharge Condition; Stable  CODE STATUS: DNR  Diet recommendation: regular diet   Brief/Interim Summary: [name] a 83 y.o.malewith medical history significant forhypertension, coronary artery disease status post CABG, chronic diastolic CHF, MDS, and recent admission for acute blood loss anemia attributed to bleeding esophageal ulcers, now presenting to the emergency department with 3 weeks of progressive generalized weakness and one day of fevers. Patient was discharged from the hospital on 08/16/2016 after management for acute blood loss anemia attributed to bleeding esophageal ulcers. He reports being in much improved condition upon discharge and felt well at home until approximately 3 weeks ago when he noted the insidious development of generalized weakness. He reports continued melanotic stools since the hospital discharge, but denies abdominal pain or nausea   Assessment & Plan:   Principal Problem:   Symptomatic anemia Active Problems:   CAD (coronary artery disease)   HTN (hypertension)   MDS (myelodysplastic syndrome), low grade (HCC)   Pancytopenia (HCC)   Ulcer of esophagus with bleeding   Severe sepsis (HCC)   Chronic diastolic CHF (congestive heart failure) (HCC)   AKI (acute kidney injury) ([place])   Adenocarcinoma of unknown origin ([place])  1-Acute Blood loss anemia, GI bleed; Gastric Ulcer and Duodenal Ulcer; Adenocarcinoma -Pt presents with melena, FOBT+ stool, Hgb 3.9 (8.0 on 08/16/16), BUN 70 -EGD (08/12/16) with bleeding esophageal ulcers and findings concerning for  severe candidal esophagitis; he has been taking Diflucan and Protonix BID for past month at home. -Continue with IV protonix. Diflucan.  -He has  received 7 units PRBC during this admission.  -3 units of platelets, counting one unit today  -GI consulted, Patient underwent endoscopy 3-15, which showed Gastric Ulcer and Duodenal Ulcer. Pathology came back with adenocarcinoma.  -oncology recommending to keep hb above 8.  -biopsy came back with adenocarcinoma. Dr Lindi Adie, order CT abdomen , chest for staging.  -CT with metastasis diseases.  -Dr Alvy Bimler is recommending palliative care.  -patient is now comfort care. Plan to trasnfer to [facility] today   2-Pancytopenia;   Has history MDS.  Worsening count in setting of infection or bleeding.  No further transfusion   3-Sepsis;  Presents with fever 102 F , leukopenia to 2.9k, elevated lactate, and hypotension  He report dysuria. Continue with IV antibiotics in setting leukopenia.  Urine culture growing 40,000 ecoli, sensitive to ceftriaxone.  Blood culture no growth to date.  Lactic acid has decreased to 2 from 4.  Stop vanc, zosyn discontinue.  Patient is now comfort care.   4-Right knee with boil. Prior knee replacement.  X ray with possible effusion/  I called PCP office culture from boil grew : light growth of streptococcus anginosis.  I have consulted Dr Cay Schillings.  Patient may have sinus tract on his right knee. Follow culture results from aspiration from 3-15. He may need open debridement and poly exchange. This on hold due to new diagnosis of cancer.  Plan for comfort care. Antibiotics discontinue.  Culture no growth.   AKI; SCr is 2.49 on admission, up from 1.13 one month prior In setting of hypovolemia, anemia, infection.  NSL fluids.  Improving.   Chronic diastolic CHF  Hold diuretics.   Hypertension  Hold for now nifedipine, and Cardura due to hypotension, sepsis, GI bleed.  Discontinue coreg.   CAD -  Status-post CABG  - resume coreg.   Hypokalemia; replete orally.   Discharge Diagnoses:  Principal Problem:   Symptomatic anemia Active Problems:   CAD (coronary artery disease)   HTN (hypertension)   MDS (myelodysplastic syndrome), low grade (HCC)   Pancytopenia (HCC)   Ulcer of esophagus with bleeding   Severe sepsis (HCC)   Chronic diastolic CHF (congestive heart failure) ([place])   AKI (acute kidney injury) ([place])   Adenocarcinoma of unknown origin [facility])    Discharge Instructions  Discharge Instructions    Diet - low sodium heart healthy    Complete by:  As directed    Increase activity slowly    Complete by:  As directed      Allergies as of 09/17/2016   No Known Allergies     Medication List    STOP taking these medications   atorvastatin 20 MG tablet Commonly known as:  LIPITOR   carvedilol 25 MG tablet Commonly known as:  COREG   doxazosin 4 MG tablet Commonly known as:  CARDURA   furosemide 80 MG tablet Commonly known as:  LASIX   levofloxacin 750 MG tablet Commonly known as:  LEVAQUIN   multivitamin with minerals tablet   NIFEdipine 30 MG 24 hr tablet Commonly known as:  PROCARDIA-XL/ADALAT CC   potassium chloride SA 20 MEQ tablet Commonly known as:  K-DUR,KLOR-CON     TAKE these medications   ACETAMINOPHEN 8 HOUR 650 MG CR tablet Generic drug:  acetaminophen Take 1,300 mg by mouth every 8 (eight) hours as needed for pain.   albuterol 108 (90 Base) MCG/ACT inhaler Commonly known as:  PROVENTIL HFA;VENTOLIN HFA Inhale 1-2 puffs into the lungs every 6 (six) hours as needed for wheezing or shortness of breath.   allopurinol 300 MG tablet Commonly known as:  ZYLOPRIM Take 300 mg by mouth daily.   feeding supplement Liqd Take 1 Container by mouth 2 (two) times daily between meals.   fluconazole 100 MG tablet Commonly known as:  DIFLUCAN Take 1 tablet (100 mg total) by mouth daily.   haloperidol 0.5 MG tablet Commonly known as:   HALDOL Take 1 tablet (0.5 mg total) by mouth every 4 (four) hours as needed for agitation (or delirium).   LORazepam 2 MG/ML concentrated solution Commonly known as:  ATIVAN Place 0.5 mLs (1 mg total) under the tongue every 4 (four) hours as needed for anxiety.   morphine CONCENTRATE 10 MG/0.5ML Soln concentrated solution Take 0.25 mLs (5 mg total) by mouth every 2 (two) hours as needed for moderate pain (or dyspnea).   ondansetron 4 MG tablet Commonly known as:  ZOFRAN Take 1 tablet (4 mg total) by mouth every 6 (six) hours as needed for nausea.   pantoprazole 40 MG tablet Commonly known as:  PROTONIX Take 1 tablet (40 mg total) by mouth 2 (two) times daily.       No Known Allergies  Consultations:  Oncology  Palliative care    Procedures/Studies: Dg Chest 2 View  Result Date: 09/10/2016 CLINICAL DATA:  Fever and progressive weakness EXAM: CHEST  2 VIEW COMPARISON:  August 16, 2016. FINDINGS: There is no edema or consolidation. There is cardiomegaly with mild pulmonary venous hypertension. No adenopathy. There is atherosclerotic calcification in the aorta. There is mild degenerative  change in the thoracic spine. Patient is status post coronary artery bypass grafting. IMPRESSION: Pulmonary vascular congestion without edema or consolidation. There is aortic atherosclerosis. Electronically Signed   By: Lowella Grip III M.D.   On: 09/10/2016 15:44   Ct Chest W Contrast  Result Date: 09/15/2016 CLINICAL DATA:  Staging workup for gastric cancer. Upper endoscopy on 09/13/2016 showed an anterior gastric wall ulcer and a duodenal ulcer. Both ulcers revealed adenocarcinoma. EXAM: CT CHEST, ABDOMEN, AND PELVIS WITH CONTRAST TECHNIQUE: Multidetector CT imaging of the chest, abdomen and pelvis was performed following the standard protocol during bolus administration of intravenous contrast. CONTRAST:  133mL ISOVUE-300 IOPAMIDOL (ISOVUE-300) INJECTION 61% COMPARISON:  Multiple exams,  including CT chest 11/27/2008 FINDINGS: CT CHEST FINDINGS Cardiovascular: Coronary, aortic arch, and branch vessel atherosclerotic vascular disease. Mild cardiomegaly. Prior median sternotomy. Mediastinum/Nodes: Right lower paratracheal node 1.9 cm in short axis on image 20/2. Chronic prevascular and AP window lymph nodes are borderline enlarged. Right hilar node 1.6 cm in short axis on image 26/2. Suspected left infrahilar adenopathy. Lungs/Pleura: Small bilateral pleural effusions without well-defined pleural mass. These effusions are nonspecific for exudative versus transudative etiology. Volume loss in the left lower lobe. Possible left infrahilar mass on image 29/2, 4.3 by 2.1 cm, although significant portions of this may be due to atelectasis. The superior segmental bronchus is occluded in this vicinity. Secondary pulmonary lobular interstitial accentuation is observed. New 5 by 3 mm apical segment right upper lobe pulmonary nodule, image 17/7. Other scattered small new pulmonary nodules are present. Despite efforts by the technologist and patient, motion artifact is present on today's exam and could not be eliminated. This reduces exam sensitivity and specificity. Chronic medial right lower lobe density could be due it to atelectasis but is technically nonspecific. Musculoskeletal: Unremarkable CT ABDOMEN PELVIS FINDINGS Hepatobiliary: Initial imaging is in the arterial phase of contrast, probably due to a reduced cardiac output. This may reduce sensitivity in assessing the solid organs. Indistinctly marginated 1.8 cm hypodense lesion in segment 7 of the liver on image 55/2, concerning for possible metastatic lesion. Several other speckled hypodensities are present in the liver but technically nonspecific due to small size, motion artifact, and early contrast phase. Multiple gallstones filling the somewhat contracted gallbladder. No biliary dilatation. Pancreas: Unremarkable Spleen: Old granulomatous disease.  Adrenals/Urinary Tract: 5.7 by 4.9 cm right adrenal mass, date arterial phase density 47 Hounsfield units, delayed phase density 46 Hounsfield units, technically nonspecific. This level was not included on prior chest CTs and accordingly I do not know if this is a chronic or more recent development. Renal cortical atrophy. Small left renal hypodense lesions are statistically likely to be small cysts but technically nonspecific due to small size. Urinary bladder unremarkable. Stomach/Bowel: The recently biopsied gastric and duodenal ulcers are surprisingly in apparent on today's CT scan. There is a 6 cm segment of abnormal wall thickening in the ascending colon which persists on the delayed images, an which has adjacent varicoid nodularity suspicious for local tumor deposit along the adjacent right paracolic gutter for example on image 28/4. On image 60/5 there is abnormal wall thickening in a 3.0 cm segment of small bowel in the right lower quadrant, with contrast in the adjacent lumen. On image 76/2 there is a 4.5 cm segment of wall thickening in the left-sided small bowel, with 2 adjacent small bowel mesentery lymph nodes measuring 1.5 and 1.3 cm in short axis on images 79 and 77 of series 2, respectively. Vascular/Lymphatic: Aortoiliac atherosclerotic vascular  disease. An aortocaval lymph node has a short axis diameter of 1.6 cm on image 73/2. Reproductive: Enlarged prostate gland measures 6.8 by 4.6 by 7.0 cm (volume = 110 cm^3) and indents the bladder base. Other: Right upper quadrant omental nodule measures 9 mm in short axis on image 62/2. Bilateral subcutaneous edema along the flanks. Trace perihepatic ascites. Musculoskeletal: Stranding along the right hip adductor musculature, cause uncertain. Lumbar spondylosis, scoliosis, and degenerative disc disease causing impingement at all lumbar levels. IMPRESSION: 1. The recently diagnosed sites of adenocarcinoma in the stomach and duodenum are not well seen, but  there are a variety of other concerning findings for malignancy including a hypodense liver lesion in the right hepatic lobe suspicious for metastatic focus; a few scattered small pulmonary nodules which were not present in 2010; a 5.7 cm otherwise nonspecific right adrenal mass ; a 6 cm focal segment of abnormal wall thickening in the ascending colon with adjacent localized adenopathy ; a 4.5 segment of abnormal small bowel wall thickening in the left lower quadrant with adjacent adenopathy ; and a 3 cm segment of small bowel wall thickening in the right lower quadrant. There is also a pathologically enlarged aortocaval lymph node, as well as enlarged right paratracheal and right hilar lymph nodes. Possible left infrahilar mass with occlusion of the superior segment left lower lobe bronchus. Appearance concerning for somewhat widespread metastatic disease, nuclear medicine PET-CT may be helpful in confirmation of these findings in assessing for other lesions. 2. Small omental nodule in the right upper quadrant, potentially a metastatic focus. 3. Small bilateral pleural effusions without well-defined pleural mass. There is cardiomegaly and secondary pulmonary lobular interstitial accentuation which may reflect interstitial edema. 4. Multiple gallstones fill the somewhat contracted gallbladder. 5. Coronary, aortic arch, and branch vessel atherosclerotic vascular disease. Aortoiliac atherosclerotic vascular disease. 6. Enlarged prostate gland, volume 110 cc. 7. Trace perihepatic ascites. 8. Stranding along the right hip adductor musculature, cause uncertain. 9. Lumbar spondylosis, scoliosis, and degenerative disc disease causing impingement at all lumbar levels. Electronically Signed   By: Van Clines M.D.   On: 09/15/2016 14:09   Ct Abdomen Pelvis W Contrast  Result Date: 09/15/2016 CLINICAL DATA:  Staging workup for gastric cancer. Upper endoscopy on 09/13/2016 showed an anterior gastric wall ulcer and a  duodenal ulcer. Both ulcers revealed adenocarcinoma. EXAM: CT CHEST, ABDOMEN, AND PELVIS WITH CONTRAST TECHNIQUE: Multidetector CT imaging of the chest, abdomen and pelvis was performed following the standard protocol during bolus administration of intravenous contrast. CONTRAST:  154mL ISOVUE-300 IOPAMIDOL (ISOVUE-300) INJECTION 61% COMPARISON:  Multiple exams, including CT chest 11/27/2008 FINDINGS: CT CHEST FINDINGS Cardiovascular: Coronary, aortic arch, and branch vessel atherosclerotic vascular disease. Mild cardiomegaly. Prior median sternotomy. Mediastinum/Nodes: Right lower paratracheal node 1.9 cm in short axis on image 20/2. Chronic prevascular and AP window lymph nodes are borderline enlarged. Right hilar node 1.6 cm in short axis on image 26/2. Suspected left infrahilar adenopathy. Lungs/Pleura: Small bilateral pleural effusions without well-defined pleural mass. These effusions are nonspecific for exudative versus transudative etiology. Volume loss in the left lower lobe. Possible left infrahilar mass on image 29/2, 4.3 by 2.1 cm, although significant portions of this may be due to atelectasis. The superior segmental bronchus is occluded in this vicinity. Secondary pulmonary lobular interstitial accentuation is observed. New 5 by 3 mm apical segment right upper lobe pulmonary nodule, image 17/7. Other scattered small new pulmonary nodules are present. Despite efforts by the technologist and patient, motion artifact is present on today's  exam and could not be eliminated. This reduces exam sensitivity and specificity. Chronic medial right lower lobe density could be due it to atelectasis but is technically nonspecific. Musculoskeletal: Unremarkable CT ABDOMEN PELVIS FINDINGS Hepatobiliary: Initial imaging is in the arterial phase of contrast, probably due to a reduced cardiac output. This may reduce sensitivity in assessing the solid organs. Indistinctly marginated 1.8 cm hypodense lesion in segment 7 of  the liver on image 55/2, concerning for possible metastatic lesion. Several other speckled hypodensities are present in the liver but technically nonspecific due to small size, motion artifact, and early contrast phase. Multiple gallstones filling the somewhat contracted gallbladder. No biliary dilatation. Pancreas: Unremarkable Spleen: Old granulomatous disease. Adrenals/Urinary Tract: 5.7 by 4.9 cm right adrenal mass, date arterial phase density 47 Hounsfield units, delayed phase density 46 Hounsfield units, technically nonspecific. This level was not included on prior chest CTs and accordingly I do not know if this is a chronic or more recent development. Renal cortical atrophy. Small left renal hypodense lesions are statistically likely to be small cysts but technically nonspecific due to small size. Urinary bladder unremarkable. Stomach/Bowel: The recently biopsied gastric and duodenal ulcers are surprisingly in apparent on today's CT scan. There is a 6 cm segment of abnormal wall thickening in the ascending colon which persists on the delayed images, an which has adjacent varicoid nodularity suspicious for local tumor deposit along the adjacent right paracolic gutter for example on image 28/4. On image 60/5 there is abnormal wall thickening in a 3.0 cm segment of small bowel in the right lower quadrant, with contrast in the adjacent lumen. On image 76/2 there is a 4.5 cm segment of wall thickening in the left-sided small bowel, with 2 adjacent small bowel mesentery lymph nodes measuring 1.5 and 1.3 cm in short axis on images 79 and 77 of series 2, respectively. Vascular/Lymphatic: Aortoiliac atherosclerotic vascular disease. An aortocaval lymph node has a short axis diameter of 1.6 cm on image 73/2. Reproductive: Enlarged prostate gland measures 6.8 by 4.6 by 7.0 cm (volume = 110 cm^3) and indents the bladder base. Other: Right upper quadrant omental nodule measures 9 mm in short axis on image 62/2. Bilateral  subcutaneous edema along the flanks. Trace perihepatic ascites. Musculoskeletal: Stranding along the right hip adductor musculature, cause uncertain. Lumbar spondylosis, scoliosis, and degenerative disc disease causing impingement at all lumbar levels. IMPRESSION: 1. The recently diagnosed sites of adenocarcinoma in the stomach and duodenum are not well seen, but there are a variety of other concerning findings for malignancy including a hypodense liver lesion in the right hepatic lobe suspicious for metastatic focus; a few scattered small pulmonary nodules which were not present in 2010; a 5.7 cm otherwise nonspecific right adrenal mass ; a 6 cm focal segment of abnormal wall thickening in the ascending colon with adjacent localized adenopathy ; a 4.5 segment of abnormal small bowel wall thickening in the left lower quadrant with adjacent adenopathy ; and a 3 cm segment of small bowel wall thickening in the right lower quadrant. There is also a pathologically enlarged aortocaval lymph node, as well as enlarged right paratracheal and right hilar lymph nodes. Possible left infrahilar mass with occlusion of the superior segment left lower lobe bronchus. Appearance concerning for somewhat widespread metastatic disease, nuclear medicine PET-CT may be helpful in confirmation of these findings in assessing for other lesions. 2. Small omental nodule in the right upper quadrant, potentially a metastatic focus. 3. Small bilateral pleural effusions without well-defined pleural mass.  There is cardiomegaly and secondary pulmonary lobular interstitial accentuation which may reflect interstitial edema. 4. Multiple gallstones fill the somewhat contracted gallbladder. 5. Coronary, aortic arch, and branch vessel atherosclerotic vascular disease. Aortoiliac atherosclerotic vascular disease. 6. Enlarged prostate gland, volume 110 cc. 7. Trace perihepatic ascites. 8. Stranding along the right hip adductor musculature, cause uncertain.  9. Lumbar spondylosis, scoliosis, and degenerative disc disease causing impingement at all lumbar levels. Electronically Signed   By: Van Clines M.D.   On: 09/15/2016 14:09   Dg Knee 3 View Right  Result Date: 09/11/2016 CLINICAL DATA:  Evaluate for effusion.  Boil. EXAM: RIGHT KNEE - 3 VIEW COMPARISON:  No recent prior . FINDINGS: Total knee replacement. Hardware intact. No acute bony abnormality identified. No evidence of acute fracture or dislocation. Multiple bony fragments and/or methylmethacrylate noted about the knee joint. This most likely chronic. Soft tissue swelling noted about the knee. Knee joint effusion most likely present . Peripheral vascular calcification. IMPRESSION: 1. Soft tissue swelling noted about the knee. Knee joint effusion most likely present. 2. Total right knee replacement with adjacent prominent bony fragments and/or methylmethacrylate. Hardware intact. No acute abnormality identified. 3. Peripheral vascular disease . Electronically Signed   By: Marcello Moores  Register   On: 09/11/2016 16:08       Subjective: Mr Krogh is alert, he denies pain.   Discharge Exam: Vitals:   09/16/16 2140 09/17/16 0702  BP: (!) 134/56 128/75  Pulse: 84 89  Resp: 18 17  Temp: 99.7 F (37.6 C) 99.3 F (37.4 C)   Vitals:   09/16/16 1612 09/16/16 2015 09/16/16 2140 09/17/16 0702  BP: (!) 136/33 (!) 134/54 (!) 134/56 128/75  Pulse: 77 78 84 89  Resp:  (!) 22 18 17   Temp:  99.1 F (37.3 C) 99.7 F (37.6 C) 99.3 F (37.4 C)  TempSrc:   Oral Oral  SpO2: 99% 99% 98% 97%  Weight:   79.4 kg (175 lb 1.6 oz)   Height:   6\' 1"  (1.854 m)     General: Pt is alert, awake, not in acute distress Cardiovascular: RRR, S1/S2 +, no rubs, no gallops Respiratory: CTA bilaterally, no wheezing, no rhonchi Abdominal: Soft, NT, ND, bowel sounds + Extremities: no edema, no cyanosis    The results of significant diagnostics from this hospitalization (including imaging, microbiology,  ancillary and laboratory) are listed below for reference.     Microbiology: Recent Results (from the past 240 hour(s))  Culture, blood (routine x 2)     Status: None   Collection Time: 09/10/16  3:11 PM  Result Value Ref Range Status   Specimen Description BLOOD RIGHT HAND  Final   Special Requests BOTTLES DRAWN AEROBIC ONLY 5CC  Final   Culture   Final    NO GROWTH 5 DAYS Performed at [facility], [address] [place], [place] [address]    Report Status 09/15/2016 FINAL  Final  Culture, blood (routine x 2)     Status: None   Collection Time: 09/10/16  4:10 PM  Result Value Ref Range Status   Specimen Description RIGHT ANTECUBITAL  Final   Special Requests BOTTLES DRAWN AEROBIC AND ANAEROBIC 5CC  Final   Culture   Final    NO GROWTH 5 DAYS Performed at [facility], [place] [address] [place], [place] [address]    Report Status 09/15/2016 FINAL  Final  Urine culture     Status: Abnormal   Collection Time: 09/11/16 12:39 AM  Result Value Ref Range Status   Specimen Description URINE, RANDOM  Final   Special Requests NONE  Final   Culture 40,000 COLONIES/mL ESCHERICHIA COLI (A)  Final   Report Status 09/13/2016 FINAL  Final   Organism ID, Bacteria ESCHERICHIA COLI (A)  Final      Susceptibility   Escherichia coli - MIC*    AMPICILLIN >=32 RESISTANT Resistant     CEFAZOLIN <=4 SENSITIVE Sensitive     CEFTRIAXONE <=1 SENSITIVE Sensitive     CIPROFLOXACIN >=4 RESISTANT Resistant     GENTAMICIN <=1 SENSITIVE Sensitive     IMIPENEM <=0.25 SENSITIVE Sensitive     NITROFURANTOIN <=16 SENSITIVE Sensitive     TRIMETH/SULFA <=20 SENSITIVE Sensitive     AMPICILLIN/SULBACTAM >=32 RESISTANT Resistant     PIP/TAZO <=4 SENSITIVE Sensitive     Extended ESBL NEGATIVE Sensitive     * 40,000 COLONIES/mL ESCHERICHIA COLI  MRSA PCR Screening     Status: Abnormal   Collection Time: 09/11/16 12:58 PM  Result Value Ref Range Status   MRSA by PCR POSITIVE (A) NEGATIVE Final     Comment:        The GeneXpert MRSA Assay (FDA approved for NASAL specimens only), is one component of a comprehensive MRSA colonization surveillance program. It is not intended to diagnose MRSA infection nor to guide or monitor treatment for MRSA infections. RESULT CALLED TO, READ BACK BY AND VERIFIED WITH: F.AKANBE RN AT [id number] ON 09/11/16 BY S.VANHOORNE   Body fluid culture     Status: None   Collection Time: 09/12/16  7:46 PM  Result Value Ref Range Status   Specimen Description SYNOVIAL RIGHT KNEE  Final   Special Requests Immunocompromised  Final   Gram Stain   Final    ABUNDANT WBC PRESENT, PREDOMINANTLY PMN NO ORGANISMS SEEN Gram Stain Report Called to,Read Back By and Verified With: Dondra Spry RN [id number] 09/12/16 A NAVARRO    Culture   Final    NO GROWTH 3 DAYS Performed at [facility], [place] [address] [place], [place] [address]    Report Status 09/16/2016 FINAL  Final  Anaerobic culture     Status: None (Preliminary result)   Collection Time: 09/12/16  7:46 PM  Result Value Ref Range Status   Specimen Description SYNOVIAL RIGHT KNEE  Final   Special Requests Immunocompromised  Final   Culture   Final    NO ANAEROBES ISOLATED; CULTURE IN PROGRESS FOR 5 DAYS   Report Status PENDING  Incomplete     Labs: BNP (last 3 results)  Recent Labs  08/11/16 0958  BNP 245.8*   Basic Metabolic Panel:  Recent Labs Lab 09/12/16 0335 09/13/16 0350 09/14/16 1045 09/15/16 0829 09/16/16 0648  NA 146* 141 137 140 141  K 3.1* 3.4* 3.5 3.2* 3.5  CL 118* 116* 112* 113* 113*  CO2 21* 19* 23 24 23   GLUCOSE 100* 149* 162* 96 107*  BUN 50* 39* 31* 30* 36*  CREATININE 1.79* 1.45* 1.15 1.01 1.09  CALCIUM 8.3* 7.9* 7.5* 7.8* 7.8*   Liver Function Tests:  Recent Labs Lab 09/10/16 1600  AST 34  ALT 20  ALKPHOS 46  BILITOT 0.6  PROT 5.6*  ALBUMIN 1.9*   No results for input(s): LIPASE, AMYLASE in the last 168 hours. No results for input(s): AMMONIA in the last 168  hours. CBC:  Recent Labs Lab 09/10/16 1600  09/11/16 0537  09/12/16 0335 09/12/16 1542  09/13/16 0350 09/13/16 2032 09/14/16 1045  09/15/16 0829 09/16/16 0648  WBC 2.0*  --  1.7*  < > 1.9* 1.8*  --  1.5*  --  1.6* 1.4* 1.5*  NEUTROABS 1.6*  --  1.4*  --  1.5*  --   --   --   --   --   --   --   HGB 3.9*  < > 5.6*  < > 7.9* 8.1*  < > 7.7* 8.7* 8.1* 7.1* 7.1*  HCT 12.2*  < > 16.6*  < > 22.8* 23.6*  < > 22.7* 25.8* 24.1* 21.0* 20.7*  MCV 82.4  --  84.7  < > 82.0 81.9  --  84.4  --  82.8 82.4 80.9  PLT 36*  --  30*  < > 33* 50*  --  35*  --  20* 21* 30*  < > = values in this interval not displayed. Cardiac Enzymes: No results for input(s): CKTOTAL, CKMB, CKMBINDEX, TROPONINI in the last 168 hours. BNP: Invalid input(s): POCBNP CBG:  Recent Labs Lab 09/14/16 0751 09/14/16 1201 09/15/16 0929 09/15/16 1130 09/15/16 1844  GLUCAP 130* 150* 119* 152* 149*   D-Dimer No results for input(s): DDIMER in the last 72 hours. Hgb A1c No results for input(s): HGBA1C in the last 72 hours. Lipid Profile No results for input(s): CHOL, HDL, LDLCALC, TRIG, CHOLHDL, LDLDIRECT in the last 72 hours. Thyroid function studies No results for input(s): TSH, T4TOTAL, T3FREE, THYROIDAB in the last 72 hours.  Invalid input(s): FREET3 Anemia work up No results for input(s): VITAMINB12, FOLATE, FERRITIN, TIBC, IRON, RETICCTPCT in the last 72 hours. Urinalysis    Component Value Date/Time   COLORURINE YELLOW 09/11/2016 0040   APPEARANCEUR CLEAR 09/11/2016 0040   LABSPEC 1.009 09/11/2016 0040   PHURINE 5.0 09/11/2016 0040   GLUCOSEU NEGATIVE 09/11/2016 0040   HGBUR NEGATIVE 09/11/2016 0040   BILIRUBINUR NEGATIVE 09/11/2016 0040   KETONESUR NEGATIVE 09/11/2016 0040   PROTEINUR NEGATIVE 09/11/2016 0040   UROBILINOGEN 1.0 12/01/2008 1911   NITRITE NEGATIVE 09/11/2016 0040   LEUKOCYTESUR TRACE (A) 09/11/2016 0040   Sepsis Labs Invalid input(s): PROCALCITONIN,  WBC,   LACTICIDVEN Microbiology Recent Results (from the past 240 hour(s))  Culture, blood (routine x 2)     Status: None   Collection Time: 09/10/16  3:11 PM  Result Value Ref Range Status   Specimen Description BLOOD RIGHT HAND  Final   Special Requests BOTTLES DRAWN AEROBIC ONLY 5CC  Final   Culture   Final    NO GROWTH 5 DAYS Performed at [facility], [place] [address], [place], [place] [address]    Report Status 09/15/2016 FINAL  Final  Culture, blood (routine x 2)     Status: None   Collection Time: 09/10/16  4:10 PM  Result Value Ref Range Status   Specimen Description RIGHT ANTECUBITAL  Final   Special Requests BOTTLES DRAWN AEROBIC AND ANAEROBIC 5CC  Final   Culture   Final    NO GROWTH 5 DAYS Performed at [facility], [place] [address], [place], [place] [address]    Report Status 09/15/2016 FINAL  Final  Urine culture     Status: Abnormal   Collection Time: 09/11/16 12:39 AM  Result Value Ref Range Status   Specimen Description URINE, RANDOM  Final   Special Requests NONE  Final   Culture 40,000 COLONIES/mL ESCHERICHIA COLI (A)  Final   Report Status 09/13/2016 FINAL  Final   Organism ID, Bacteria ESCHERICHIA COLI (A)  Final  Susceptibility   Escherichia coli - MIC*    AMPICILLIN >=32 RESISTANT Resistant     CEFAZOLIN <=4 SENSITIVE Sensitive     CEFTRIAXONE <=1 SENSITIVE Sensitive     CIPROFLOXACIN >=4 RESISTANT Resistant     GENTAMICIN <=1 SENSITIVE Sensitive     IMIPENEM <=0.25 SENSITIVE Sensitive     NITROFURANTOIN <=16 SENSITIVE Sensitive     TRIMETH/SULFA <=20 SENSITIVE Sensitive     AMPICILLIN/SULBACTAM >=32 RESISTANT Resistant     PIP/TAZO <=4 SENSITIVE Sensitive     Extended ESBL NEGATIVE Sensitive     * 40,000 COLONIES/mL ESCHERICHIA COLI  MRSA PCR Screening     Status: Abnormal   Collection Time: 09/11/16 12:58 PM  Result Value Ref Range Status   MRSA by PCR POSITIVE (A) NEGATIVE Final    Comment:        The GeneXpert MRSA Assay  (FDA approved for NASAL specimens only), is one component of a comprehensive MRSA colonization surveillance program. It is not intended to diagnose MRSA infection nor to guide or monitor treatment for MRSA infections. RESULT CALLED TO, READ BACK BY AND VERIFIED WITH: F.AKANBE RN AT 8250 ON 09/11/16 BY S.VANHOORNE   Body fluid culture     Status: None   Collection Time: 09/12/16  7:46 PM  Result Value Ref Range Status   Specimen Description SYNOVIAL RIGHT KNEE  Final   Special Requests Immunocompromised  Final   Gram Stain   Final    ABUNDANT WBC PRESENT, PREDOMINANTLY PMN NO ORGANISMS SEEN Gram Stain Report Called to,Read Back By and Verified With: Dondra Spry RN [id number] 09/12/16 A NAVARRO    Culture   Final    NO GROWTH 3 DAYS Performed at [facility], [place] [address], [place], [place] [address]    Report Status 09/16/2016 FINAL  Final  Anaerobic culture     Status: None (Preliminary result)   Collection Time: 09/12/16  7:46 PM  Result Value Ref Range Status   Specimen Description SYNOVIAL RIGHT KNEE  Final   Special Requests Immunocompromised  Final   Culture   Final    NO ANAEROBES ISOLATED; CULTURE IN PROGRESS FOR 5 DAYS   Report Status PENDING  Incomplete     Time coordinating discharge: Over 30 minutes  SIGNED:   Elmarie Shiley, MD  [facility] 09/17/2016, 9:45 AM Pager [phone]  If 7PM-7AM, please contact night-coverage [email or web address] Password [email or web address]

## 2016-09-17 NOTE — Care Management Important Message (Addendum)
Important Message  Patient DetailsIM Letter given to Nora/Case Manager to present to Patient Name: [name] MRN: [id number] Date of Birth: 09/15/1933   Medicare Important Message Given:  Yes    Kerin Salen 09/17/2016, 9:49 AMImportant Message  Patient Details  Name: [name] MRN: [id number] Date of Birth: 1933/09/14   Medicare Important Message Given:  Yes    Kerin Salen 09/17/2016, 9:49 AM

## 2016-09-19 ENCOUNTER — Ambulatory Visit: Payer: Medicare Other | Admitting: Hematology and Oncology

## 2016-09-19 ENCOUNTER — Other Ambulatory Visit: Payer: Medicare Other

## 2016-09-19 LAB — TYPE AND SCREEN
ABO/RH(D): A POS
ANTIBODY SCREEN: NEGATIVE
UNIT DIVISION: 0
Unit division: 0
Unit division: 0

## 2016-09-19 LAB — BPAM RBC
BLOOD PRODUCT EXPIRATION DATE: 201804122359
Blood Product Expiration Date: 201804062359
Blood Product Expiration Date: 201804082359
ISSUE DATE / TIME: 201803181711
ISSUE DATE / TIME: 201803190937
ISSUE DATE / TIME: 201803191309
UNIT TYPE AND RH: 6200
Unit Type and Rh: 6200
Unit Type and Rh: 6200

## 2016-09-29 DEATH — deceased

## 2016-11-30 NOTE — Addendum Note (Signed)
Addendum  created 11/30/16 1031 by Blinda Turek, MD   Sign clinical note    

## 2016-12-02 NOTE — Anesthesia Postprocedure Evaluation (Signed)
Anesthesia Post Note  Patient: [name]  Procedure(s) Performed: Procedure(s) (LRB): ESOPHAGOGASTRODUODENOSCOPY (EGD) WITH PROPOFOL (N/A)     Anesthesia Post Evaluation  Last Vitals:  Vitals:   08/16/16 0525 08/16/16 1505  BP: (!) 146/55 (!) 115/57  Pulse: 70 75  Resp: 17 17  Temp: 37.1 C 37.2 C    Last Pain:  Vitals:   08/16/16 1505  TempSrc: Oral  PainSc:                  Hamdi Vari S

## 2016-12-02 NOTE — Addendum Note (Signed)
Addendum  created 12/02/16 1107 by Myrtie Soman, MD   Sign clinical note
